# Patient Record
Sex: Male | Born: 1945 | Race: White | Hispanic: Refuse to answer | Marital: Married | Smoking: Never smoker
Health system: Southern US, Community
[De-identification: ages and names within clinical notes are randomized; demographics above are authoritative.]

## PROBLEM LIST (undated history)

## (undated) DIAGNOSIS — Z87442 Personal history of urinary calculi: Secondary | ICD-10-CM

## (undated) DIAGNOSIS — J309 Allergic rhinitis, unspecified: Secondary | ICD-10-CM

## (undated) DIAGNOSIS — I639 Cerebral infarction, unspecified: Secondary | ICD-10-CM

## (undated) DIAGNOSIS — N400 Enlarged prostate without lower urinary tract symptoms: Secondary | ICD-10-CM

## (undated) DIAGNOSIS — T7840XA Allergy, unspecified, initial encounter: Secondary | ICD-10-CM

## (undated) DIAGNOSIS — R350 Frequency of micturition: Secondary | ICD-10-CM

## (undated) DIAGNOSIS — G459 Transient cerebral ischemic attack, unspecified: Secondary | ICD-10-CM

## (undated) DIAGNOSIS — R7611 Nonspecific reaction to tuberculin skin test without active tuberculosis: Secondary | ICD-10-CM

## (undated) DIAGNOSIS — I10 Essential (primary) hypertension: Secondary | ICD-10-CM

## (undated) DIAGNOSIS — R0602 Shortness of breath: Secondary | ICD-10-CM

## (undated) DIAGNOSIS — N200 Calculus of kidney: Secondary | ICD-10-CM

## (undated) DIAGNOSIS — I2510 Atherosclerotic heart disease of native coronary artery without angina pectoris: Secondary | ICD-10-CM

## (undated) DIAGNOSIS — E785 Hyperlipidemia, unspecified: Secondary | ICD-10-CM

## (undated) HISTORY — DX: Personal history of urinary calculi: Z87.442

## (undated) HISTORY — DX: Frequency of micturition: R35.0

## (undated) HISTORY — DX: Essential (primary) hypertension: I10

## (undated) HISTORY — DX: Calculus of kidney: N20.0

## (undated) HISTORY — DX: Benign prostatic hyperplasia without lower urinary tract symptoms: N40.0

## (undated) HISTORY — DX: Allergic rhinitis, unspecified: J30.9

## (undated) HISTORY — PX: TONSILLECTOMY: SUR1361

## (undated) HISTORY — DX: Allergy, unspecified, initial encounter: T78.40XA

## (undated) HISTORY — DX: Atherosclerotic heart disease of native coronary artery without angina pectoris: I25.10

## (undated) HISTORY — DX: Shortness of breath: R06.02

## (undated) HISTORY — DX: Hyperlipidemia, unspecified: E78.5

## (undated) HISTORY — DX: Nonspecific reaction to tuberculin skin test without active tuberculosis: R76.11

---

## 2004-02-06 ENCOUNTER — Ambulatory Visit: Payer: Self-pay | Admitting: Internal Medicine

## 2004-02-11 ENCOUNTER — Ambulatory Visit: Payer: Self-pay | Admitting: Internal Medicine

## 2004-09-26 ENCOUNTER — Ambulatory Visit: Payer: Self-pay | Admitting: Internal Medicine

## 2005-03-20 ENCOUNTER — Ambulatory Visit: Payer: Self-pay | Admitting: Internal Medicine

## 2005-07-14 ENCOUNTER — Encounter: Payer: Self-pay | Admitting: Internal Medicine

## 2005-07-21 ENCOUNTER — Ambulatory Visit (HOSPITAL_COMMUNITY): Admission: RE | Admit: 2005-07-21 | Discharge: 2005-07-21 | Payer: Self-pay | Admitting: Urology

## 2005-09-08 ENCOUNTER — Ambulatory Visit: Payer: Self-pay | Admitting: Internal Medicine

## 2005-09-29 ENCOUNTER — Ambulatory Visit: Payer: Self-pay | Admitting: Internal Medicine

## 2006-01-12 HISTORY — PX: PROSTATE BIOPSY: SHX241

## 2006-03-11 ENCOUNTER — Encounter: Payer: Self-pay | Admitting: Internal Medicine

## 2006-04-14 ENCOUNTER — Ambulatory Visit: Payer: Self-pay | Admitting: Internal Medicine

## 2006-10-12 DIAGNOSIS — N401 Enlarged prostate with lower urinary tract symptoms: Secondary | ICD-10-CM | POA: Insufficient documentation

## 2006-10-12 DIAGNOSIS — N400 Enlarged prostate without lower urinary tract symptoms: Secondary | ICD-10-CM

## 2006-10-12 DIAGNOSIS — Z87442 Personal history of urinary calculi: Secondary | ICD-10-CM | POA: Insufficient documentation

## 2006-10-12 DIAGNOSIS — I10 Essential (primary) hypertension: Secondary | ICD-10-CM | POA: Insufficient documentation

## 2006-10-12 DIAGNOSIS — I2510 Atherosclerotic heart disease of native coronary artery without angina pectoris: Secondary | ICD-10-CM | POA: Insufficient documentation

## 2006-10-12 HISTORY — DX: Essential (primary) hypertension: I10

## 2006-10-12 HISTORY — DX: Personal history of urinary calculi: Z87.442

## 2006-10-12 HISTORY — DX: Benign prostatic hyperplasia without lower urinary tract symptoms: N40.0

## 2006-10-12 HISTORY — DX: Atherosclerotic heart disease of native coronary artery without angina pectoris: I25.10

## 2006-11-08 ENCOUNTER — Ambulatory Visit: Payer: Self-pay | Admitting: Internal Medicine

## 2006-11-08 DIAGNOSIS — E785 Hyperlipidemia, unspecified: Secondary | ICD-10-CM

## 2006-11-08 DIAGNOSIS — J309 Allergic rhinitis, unspecified: Secondary | ICD-10-CM

## 2006-11-08 DIAGNOSIS — M25529 Pain in unspecified elbow: Secondary | ICD-10-CM | POA: Insufficient documentation

## 2006-11-08 HISTORY — DX: Allergic rhinitis, unspecified: J30.9

## 2006-11-08 HISTORY — DX: Hyperlipidemia, unspecified: E78.5

## 2006-11-09 ENCOUNTER — Encounter: Payer: Self-pay | Admitting: Internal Medicine

## 2006-11-24 ENCOUNTER — Encounter: Payer: Self-pay | Admitting: Internal Medicine

## 2007-04-12 ENCOUNTER — Telehealth: Payer: Self-pay | Admitting: Internal Medicine

## 2007-04-29 ENCOUNTER — Ambulatory Visit: Payer: Self-pay | Admitting: Internal Medicine

## 2007-04-29 LAB — CONVERTED CEMR LAB
ALT: 29 units/L (ref 0–53)
Albumin: 4 g/dL (ref 3.5–5.2)
BUN: 7 mg/dL (ref 6–23)
Bilirubin, Direct: 0.1 mg/dL (ref 0.0–0.3)
Creatinine, Ser: 0.9 mg/dL (ref 0.4–1.5)
Eosinophils Relative: 4.4 % (ref 0.0–5.0)
GFR calc Af Amer: 110 mL/min
Glucose, Bld: 96 mg/dL (ref 70–99)
Glucose, Urine, Semiquant: NEGATIVE
HCT: 45.1 % (ref 39.0–52.0)
Ketones, urine, test strip: NEGATIVE
Monocytes Absolute: 0.5 10*3/uL (ref 0.1–1.0)
Monocytes Relative: 9.8 % (ref 3.0–12.0)
Neutro Abs: 2.6 10*3/uL (ref 1.4–7.7)
Nitrite: NEGATIVE
PSA: 9.11 ng/mL — ABNORMAL HIGH (ref 0.10–4.00)
Platelets: 186 10*3/uL (ref 150–400)
RBC: 5.15 M/uL (ref 4.22–5.81)
RDW: 12.5 % (ref 11.5–14.6)
TSH: 2.09 microintl units/mL (ref 0.35–5.50)
Total Bilirubin: 1 mg/dL (ref 0.3–1.2)
Total Protein: 6.7 g/dL (ref 6.0–8.3)
Urobilinogen, UA: 0.2
WBC Urine, dipstick: NEGATIVE

## 2007-05-05 ENCOUNTER — Ambulatory Visit: Payer: Self-pay | Admitting: Internal Medicine

## 2008-08-03 ENCOUNTER — Ambulatory Visit: Payer: Self-pay | Admitting: Family Medicine

## 2008-08-03 LAB — CONVERTED CEMR LAB
AST: 22 units/L (ref 0–37)
BUN: 15 mg/dL (ref 6–23)
Basophils Absolute: 0 10*3/uL (ref 0.0–0.1)
Basophils Relative: 0.4 % (ref 0.0–3.0)
CO2: 31 meq/L (ref 19–32)
Creatinine, Ser: 0.9 mg/dL (ref 0.4–1.5)
Eosinophils Relative: 4.2 % (ref 0.0–5.0)
GFR calc non Af Amer: 90.48 mL/min (ref >60)
Glucose, Bld: 97 mg/dL (ref 70–99)
Hemoglobin: 15.7 g/dL (ref 13.0–17.0)
Lymphocytes Relative: 28.8 % (ref 12.0–46.0)
Lymphs Abs: 1.3 10*3/uL (ref 0.7–4.0)
MCV: 89 fL (ref 78.0–100.0)
Monocytes Relative: 11.6 % (ref 3.0–12.0)
Neutro Abs: 2.6 10*3/uL (ref 1.4–7.7)
Neutrophils Relative %: 55 % (ref 43.0–77.0)
Nitrite: NEGATIVE
PSA: 9.25 ng/mL — ABNORMAL HIGH (ref 0.10–4.00)
RBC: 5.07 M/uL (ref 4.22–5.81)
Sodium: 144 meq/L (ref 135–145)
Specific Gravity, Urine: 1.025
Urobilinogen, UA: 1
VLDL: 27 mg/dL (ref 0.0–40.0)
WBC Urine, dipstick: NEGATIVE

## 2008-08-07 ENCOUNTER — Ambulatory Visit: Payer: Self-pay | Admitting: Internal Medicine

## 2008-08-15 ENCOUNTER — Ambulatory Visit: Payer: Self-pay | Admitting: Gastroenterology

## 2008-08-21 ENCOUNTER — Encounter: Payer: Self-pay | Admitting: Internal Medicine

## 2008-11-07 ENCOUNTER — Ambulatory Visit: Payer: Self-pay | Admitting: Internal Medicine

## 2008-11-07 DIAGNOSIS — S93409A Sprain of unspecified ligament of unspecified ankle, initial encounter: Secondary | ICD-10-CM | POA: Insufficient documentation

## 2008-11-07 LAB — CONVERTED CEMR LAB
AST: 23 units/L (ref 0–37)
LDL Cholesterol: 115 mg/dL — ABNORMAL HIGH (ref 0–99)
Total CHOL/HDL Ratio: 5
VLDL: 31.4 mg/dL (ref 0.0–40.0)

## 2009-02-06 ENCOUNTER — Ambulatory Visit: Payer: Self-pay | Admitting: Internal Medicine

## 2009-03-22 ENCOUNTER — Telehealth: Payer: Self-pay | Admitting: Internal Medicine

## 2009-03-22 ENCOUNTER — Encounter: Payer: Self-pay | Admitting: Internal Medicine

## 2009-05-07 ENCOUNTER — Encounter: Payer: Self-pay | Admitting: Internal Medicine

## 2009-06-05 ENCOUNTER — Ambulatory Visit: Payer: Self-pay | Admitting: Internal Medicine

## 2009-06-05 DIAGNOSIS — T887XXA Unspecified adverse effect of drug or medicament, initial encounter: Secondary | ICD-10-CM

## 2009-06-05 LAB — CONVERTED CEMR LAB: AST: 23 units/L (ref 0–37)

## 2009-10-18 ENCOUNTER — Ambulatory Visit: Payer: Self-pay | Admitting: Family Medicine

## 2009-10-18 DIAGNOSIS — R350 Frequency of micturition: Secondary | ICD-10-CM

## 2009-10-18 HISTORY — DX: Frequency of micturition: R35.0

## 2009-10-18 LAB — CONVERTED CEMR LAB
Bilirubin Urine: NEGATIVE
Glucose, Urine, Semiquant: NEGATIVE
Ketones, urine, test strip: NEGATIVE
Protein, U semiquant: NEGATIVE
Urobilinogen, UA: 0.2

## 2009-10-19 ENCOUNTER — Encounter: Payer: Self-pay | Admitting: Internal Medicine

## 2009-11-21 ENCOUNTER — Encounter: Payer: Self-pay | Admitting: Internal Medicine

## 2009-11-26 ENCOUNTER — Ambulatory Visit: Payer: Self-pay | Admitting: Internal Medicine

## 2009-11-26 LAB — CONVERTED CEMR LAB
Albumin: 4.1 g/dL (ref 3.5–5.2)
BUN: 14 mg/dL (ref 6–23)
Bilirubin Urine: NEGATIVE
Cholesterol: 144 mg/dL (ref 0–200)
Creatinine, Ser: 0.8 mg/dL (ref 0.4–1.5)
Eosinophils Relative: 8.9 % — ABNORMAL HIGH (ref 0.0–5.0)
Glucose, Urine, Semiquant: NEGATIVE
HDL: 34.9 mg/dL — ABNORMAL LOW (ref >39.00)
Hemoglobin: 15.1 g/dL (ref 13.0–17.0)
Ketones, urine, test strip: NEGATIVE
LDL Cholesterol: 85 mg/dL (ref 0–99)
Lymphocytes Relative: 24.5 % (ref 12.0–46.0)
MCHC: 34.6 g/dL (ref 30.0–36.0)
MCV: 88.7 fL (ref 78.0–100.0)
PSA: 8.98 ng/mL — ABNORMAL HIGH (ref 0.10–4.00)
RDW: 13.1 % (ref 11.5–14.6)
Sodium: 139 meq/L (ref 135–145)
Specific Gravity, Urine: 1.02
TSH: 2.54 microintl units/mL (ref 0.35–5.50)
Total Bilirubin: 1 mg/dL (ref 0.3–1.2)
Total Protein: 6.4 g/dL (ref 6.0–8.3)
Triglycerides: 122 mg/dL (ref 0.0–149.0)
Urobilinogen, UA: 0.2
VLDL: 24.4 mg/dL (ref 0.0–40.0)
pH: 7

## 2009-12-02 ENCOUNTER — Ambulatory Visit: Payer: Self-pay | Admitting: Internal Medicine

## 2009-12-02 ENCOUNTER — Encounter: Payer: Self-pay | Admitting: Internal Medicine

## 2009-12-19 ENCOUNTER — Ambulatory Visit: Payer: Self-pay | Admitting: Internal Medicine

## 2009-12-19 DIAGNOSIS — J069 Acute upper respiratory infection, unspecified: Secondary | ICD-10-CM | POA: Insufficient documentation

## 2010-01-12 HISTORY — PX: CARDIAC CATHETERIZATION: SHX172

## 2010-02-11 NOTE — Assessment & Plan Note (Signed)
Summary: cpx//ccm   Vital Signs:  Patient profile:   65 year old male Height:      68 inches Weight:      213 pounds Temp:     97.5 degrees F oral BP sitting:   150 / 90  (right arm) Cuff size:   regular  Vitals Entered By: Duard Brady LPN (December 02, 2009 2:45 PM) CC: cpx - doing well Is Patient Diabetic? No   CC:  cpx - doing well.  History of Present Illness: a 65 year old patient who is seen today for a health maintenance examination.  He has a history of treated hypertension and minimal coronary artery disease.  He is status post heart catheterization in 2000.  He has dyslipidemia, and a chronically elevated PSA.  He is followed by urology twice annually.  Allergies (verified): No Known Drug Allergies  Past History:  Past Medical History: Reviewed history from 02/06/2009 and no changes required. Allergic rhinitis Hyperlipidemia Hypertension Nephrolithiasis, hx of Benign prostatic hypertrophy chronic microhematuria chronically elevated PSA Positive PPD Coronary artery disease-mild nonobstructive  Past Surgical History: Reviewed history from 02/06/2009 and no changes required. kidney stones 1980 heart catheterization 2000 with minimal nonobstructive  CAD prostate biopsy  x 3 colonoscopy approximately 1995  Family History: Reviewed history from 11/08/2006 and no changes required. 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    Physical Exam  General:  overweight-appearing.  140/90overweight-appearing.   Head:  Normocephalic and atraumatic without obvious abnormalities. No apparent alopecia or balding. Eyes:  No corneal or conjunctival inflammation noted. EOMI. Perrla. Funduscopic exam benign, without hemorrhages, exudates or papilledema. Vision grossly normal. Ears:  External ear exam shows no significant lesions or deformities.  Otoscopic examination reveals clear canals, tympanic membranes are intact bilaterally without bulging, retraction, inflammation or discharge. Hearing is grossly normal bilaterally. Nose:  External nasal examination shows no deformity or inflammation. Nasal mucosa are pink and moist without lesions or exudates. Mouth:  Oral mucosa and oropharynx without lesions or exudates.  Teeth in good repair. Neck:  No deformities, masses, or tenderness noted. Chest Wall:  No deformities, masses, tenderness or gynecomastia noted. Breasts:  No masses or gynecomastia noted Lungs:  Normal respiratory effort, chest expands symmetrically. Lungs are clear to auscultation, no crackles or wheezes. Heart:  Normal rate and regular rhythm. S1 and S2 normal without gallop, murmur, click, rub or other extra sounds. Abdomen:  Bowel sounds positive,abdomen soft and non-tender without masses, organomegaly or hernias noted. Msk:  No deformity or scoliosis noted of thoracic or lumbar spine.   Pulses:  R and L carotid,radial,femoral,dorsalis pedis and posterior tibial pulses are full and equal bilaterally Neurologic:  No cranial nerve deficits noted. Station and gait are normal. Plantar reflexes are down-going bilaterally. DTRs are symmetrical throughout. Sensory, motor and coordinative functions appear  intact. Skin:  Intact without suspicious lesions or rashes Cervical Nodes:  No lymphadenopathy noted Axillary Nodes:  No palpable  lymphadenopathy Inguinal Nodes:  No significant adenopathy Psych:  Cognition and judgment appear intact. Alert and cooperative with normal attention span and concentration. No apparent delusions, illusions, hallucinations   Impression & Recommendations:  Problem # 1:  HEALTH MAINTENANCE EXAM (ICD-V70.0)  Complete Medication List: 1)  Baby Aspirin 81 Mg Chew (Aspirin) .Marland Kitchen.. 1 once daily 2)  Simvastatin 40 Mg Tabs (Simvastatin) .... One daily 3)  Align 4 Mg Caps (Probiotic product) .Marland Kitchen.. 1 cap by mouth daily as needed antibiotic use 4)  Aleve 220 Mg Tabs (Naproxen sodium) .Marland Kitchen.. 1-2 tabs by mouth two times a day as needed pain with food 5)  Atenolol-chlorthalidone 50-25 Mg Tabs (Atenolol-chlorthalidone) .... One daily  Other Orders: EKG w/ Interpretation (93000)  Patient Instructions: 1)  Please schedule a follow-up appointment in 6 months. 2)  Limit your Sodium (Salt). 3)  It is important that you exercise regularly at least 20 minutes 5 times a week. If you develop chest pain, have severe difficulty breathing, or feel very tired , stop exercising immediately and seek medical attention. 4)  You need to lose weight. Consider a lower calorie diet and regular exercise.  5)  Check your Blood Pressure regularly. If it is above: 150/90  you should make an appointment. Prescriptions: ATENOLOL-CHLORTHALIDONE 50-25 MG TABS (ATENOLOL-CHLORTHALIDONE) one daily  #90 x 6   Entered and Authorized by:   Gordy Savers  MD   Signed by:   Gordy Savers  MD on 12/02/2009   Method used:   Electronically to        [facility]  [phone]* (retail)       [address] [place], [place]  [address]       Ph: [phone] or [phone]       Fax: [phone]   RxID:   [id number] SIMVASTATIN 40 MG TABS (SIMVASTATIN) one daily  #90 x 6   Entered and  Authorized by:   Gordy Savers  MD   Signed by:   Gordy Savers  MD on 12/02/2009   Method used:   Electronically to        [facility]  [phone]* (retail)       [address] [place], [place]  [address]       Ph: [phone] or [phone]       Fax: [phone]   RxID:   [id number]    Orders Added: 1)  EKG w/ Interpretation [93000] 2)  Est. Patient 40-64 years [29937]

## 2010-02-11 NOTE — Assessment & Plan Note (Signed)
Summary: ROA X 3 MTHS / RS   Vital Signs:  Patient profile:   65 year old male Weight:      210 pounds Temp:     97.6 degrees F oral BP sitting:   130 / 84  (left arm) Cuff size:   regular  Vitals Entered By: Raechel Ache, RN (February 06, 2009 9:58 AM) CC: 3 mo ROV Is Patient Diabetic? No  Does patient need assistance? Functional Status Social activities   CC:  3 mo ROV.  History of Present Illness: 65 year old patient who is seen today for follow-up of  hypertension, dyslipidemia, and coronary artery  disease.  He remains asymptomatic.  He is now on simvastatin 20 mg daily.  His last LDL cholesterol 115.  He has tolerated this medication well and denies any myalgias or muscle weakness.  Denies any exertional chest pain or shortness of breath.  Allergies: No Known Drug Allergies  Past History:  Past Medical History: Allergic rhinitis Hyperlipidemia Hypertension Nephrolithiasis, hx of Benign prostatic hypertrophy chronic microhematuria chronically elevated PSA Positive PPD Coronary artery disease-mild nonobstructive  Past Surgical History: kidney stones 1980 heart catheterization 2000 with minimal nonobstructive  CAD prostate biopsy  x 3 colonoscopy approximately 1995  Family History: Reviewed history from 11/08/2006 and no changes required. 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    Physical Exam  General:  Well-developed,well-nourished,in no acute distress; alert,appropriate and cooperative throughout examination; 120/72 Head:  Normocephalic and atraumatic without obvious abnormalities. No apparent alopecia or balding. Eyes:  No corneal or conjunctival inflammation noted. EOMI. Perrla. Funduscopic exam benign, without hemorrhages, exudates or papilledema. Vision grossly normal. Mouth:  Oral mucosa and oropharynx without lesions or exudates.  Teeth in good repair. Neck:  No deformities, masses, or tenderness noted. Lungs:  Normal respiratory effort, chest expands symmetrically. Lungs are clear to auscultation, no crackles or wheezes. Heart:  Normal rate and regular rhythm. S1 and S2 normal without gallop, murmur, click, rub or other extra sounds. Msk:  No deformity or scoliosis noted of thoracic or lumbar spine.   Pulses:  R and L carotid,radial,femoral,dorsalis pedis and posterior tibial pulses are full and equal bilaterally Extremities:  No clubbing, cyanosis, edema, or deformity noted with normal full range of motion of all joints.     Impression & Recommendations:  Problem # 1:  HYPERLIPIDEMIA (ICD-272.4)  The following medications were removed from the medication list:    Simvastatin 20 Mg Tabs (Simvastatin) ..... One daily His updated medication list for this problem includes:    Simvastatin 40 Mg Tabs (Simvastatin) ..... One daily    The following medications were removed from the medication list:    Simvastatin 20 Mg Tabs (Simvastatin) ..... One daily His updated medication list for this problem includes:    Simvastatin 40 Mg Tabs (Simvastatin) ..... One daily  Orders: Prescription Created Electronically [phone])  Problem # 2:  HYPERTENSION (ICD-401.9)  His updated medication  list for  this problem includes:    Atenolol 25 Mg Tabs (Atenolol) .Marland Kitchen... 1 once daily    Hydrochlorothiazide 12.5 Mg Caps (Hydrochlorothiazide) .Marland Kitchen... 1 once daily  His updated medication list for this problem includes:    Atenolol 25 Mg Tabs (Atenolol) .Marland Kitchen... 1 once daily    Hydrochlorothiazide 12.5 Mg Caps (Hydrochlorothiazide) .Marland Kitchen... 1 once daily  Complete Medication List: 1)  Atenolol 25 Mg Tabs (Atenolol) .Marland Kitchen.. 1 once daily 2)  Hydrochlorothiazide 12.5 Mg Caps (Hydrochlorothiazide) .Marland Kitchen.. 1 once daily 3)  Baby Aspirin 81 Mg Chew (Aspirin) .Marland Kitchen.. 1 once daily 4)  Simvastatin 40 Mg Tabs (Simvastatin) .... One daily  Patient Instructions: 1)  Please schedule a follow-up appointment in 4 months. 2)  Limit your Sodium (Salt). 3)  It is important that you exercise regularly at least 20 minutes 5 times a week. If you develop chest pain, have severe difficulty breathing, or feel very tired , stop exercising immediately and seek medical attention. Prescriptions: SIMVASTATIN 40 MG TABS (SIMVASTATIN) one daily  #90 x 6   Entered and Authorized by:   Gordy Savers  MD   Signed by:   Gordy Savers  MD on 02/06/2009   Method used:   [facility] to        [facility]  [phone]* (retail)       [address] [place], [place]  [address]       Ph: [phone] or [phone]       Fax: [phone]   RxID:   [id number]

## 2010-02-11 NOTE — Progress Notes (Signed)
Summary: letter needed  Phone Note Call from Patient   Caller: Patient Call For: Gordy Savers  MD Details for Reason: letter Summary of Call: Dr. Kirtland Bouchard does an annual letter for Thornell Sartorius (DOB August 21, 2045) - The letter is a general health statement along with any meds taken.  Please call Mr. Copelin at [phone] when the letter is complete. - information was given to Arion at front desk . I reviewed chart - last  letter done 05/05/07. KIK Initial call taken by: Duard Brady LPN,  March 22, 2009 11:35 AM  Follow-up for Phone Call        done Follow-up by: Gordy Savers  MD,  March 22, 2009 12:57 PM  Additional Follow-up for Phone Call Additional follow up Details #1::        called hm# - ans mach - LMTCB if questions - Letter ready for pick up.  Additional Follow-up by: Duard Brady LPN,  March 22, 2009 1:20 PM

## 2010-02-11 NOTE — Letter (Signed)
Summary: [facility]  [facility]   Imported By: Maryln Gottron 11/29/2009 10:21:00  _____________________________________________________________________  External Attachment:    Type:   Image     Comment:   External Document

## 2010-02-11 NOTE — Letter (Signed)
Summary: [facility]  [facility]   Imported By: Maryln Gottron 12/11/2009 14:21:05  _____________________________________________________________________  External Attachment:    Type:   Image     Comment:   External Document

## 2010-02-11 NOTE — Letter (Signed)
Summary: Generic Letter  [place] [facility]  [address] [place], [place] [address]   Phone: [phone]  Fax: [phone]    03/22/2009  SAHIB PELLA [address] [place], [place]  [address]  Dear Milford Cage:  Mr. Hallenbeck is a patient followed in our internal medicine practice since 2004.  His last annual examination was in July of 2010 and his most recent office visit in January of this year. This patient has mild dyslipidemia, and treated hypertension, which have been under excellent control.  He has no exercise limitations and remains stable and quite compliant with his medical regimen. If further details are required please do not hesitate to contact this office.          Sincerely,   Eleonore Chiquito  MD

## 2010-02-11 NOTE — Assessment & Plan Note (Signed)
Summary: UTI/ dm   Vital Signs:  Patient profile:   65 year old male Height:      68 inches (172.72 cm) Weight:      211 pounds (95.91 kg) BMI:     32.20 O2 Sat:      97 % on Room air Temp:     98.1 degrees F (36.72 degrees C) oral Pulse rate:   59 / minute BP sitting:   158 / 90  (left arm) Cuff size:   regular  Vitals Entered By: Josph Macho RMA (October 18, 2009 3:44 PM)  O2 Flow:  Room air  History of Present Illness: Patient in today for evaluation of 1 week worth of symptoms. He started to have low back pain on Sunday am after working on a door. Unfortunately the low back pain persisted and now has worsened despite heat and resting his back. Then about 3 days ago he began to have urinary frequency, urgency and dysuria, as well as low grade fevers/chills/nausea. Denies any CP/palp/SOB/GI c/o. Patient sees Urology every 6 months and has had trouble with UTIs in the past  Allergies: No Known Drug Allergies  Past History:  Past medical history reviewed for relevance to current acute and chronic problems. Social history (including risk factors) reviewed for relevance to current acute and chronic problems.  Past Medical History: Reviewed history from 02/06/2009 and no changes required. 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 no    Any prior history of allergic reactions to egg and/or gelatin? no    Do you have a sensitivity to the preservative Thimersol? no    Do you have a past history of Guillan-Barre Syndrome? no    Do you currently have an acute  febrile illness? no    Have you ever had a severe reaction to latex? no    Vaccine information given and explained to patient? yes    Are you currently pregnant? no    Lot Number:[id number]   Exp Date:07/12/2010   Site Given  Left Deltoid IM Josph Macho RMA  October 18, 2009 3:46 PM    Physical Exam  General:  Well-developed,well-nourished,in no acute distress; alert,appropriate and cooperative throughout examination Head:  Normocephalic and atraumatic without obvious abnormalities. No apparent alopecia or balding. Mouth:  Oral mucosa and oropharynx without lesions or exudates.  Teeth in good repair. Neck:  No deformities, masses, or tenderness noted. Lungs:  Normal respiratory effort, chest expands symmetrically. Lungs are clear to auscultation, no crackles or wheezes. Heart:  Normal rate and regular rhythm. S1 and S2 normal without gallop, murmur, click, rub or other extra sounds. Abdomen:  Bowel sounds positive,abdomen soft and non-tender without masses, organomegaly or hernias noted. Extremities:  No clubbing, cyanosis, edema, or deformity noted with normal full range of motion of all joints.   Psych:  Cognition and judgment appear intact. Alert and cooperative with normal attention span and concentration. No apparent delusions, illusions, hallucinations   Impression & Recommendations:  Problem # 1:  URINARY FREQUENCY (ICD-788.41) Ciprofloxacin, increase fluids, may use Aleve as needed and start Align daily for the  next month  Problem # 2:  HYPERTENSION (ICD-401.9)  His updated medication list for this problem includes:    Atenolol 25 Mg Tabs (Atenolol) .Marland Kitchen... 1 once daily    Hydrochlorothiazide 12.5 Mg Caps (Hydrochlorothiazide) .Marland Kitchen... 1 once daily Mild elevation with acute illness, does report it tends to be up when he is here, willhave him monitor and report persistently elevated numbers  Complete Medication List: 1)  Atenolol 25 Mg Tabs (Atenolol) .Marland Kitchen.. 1 once daily 2)   Hydrochlorothiazide 12.5 Mg Caps (Hydrochlorothiazide) .Marland Kitchen.. 1 once daily 3)  Baby Aspirin 81 Mg Chew (Aspirin) .Marland Kitchen.. 1 once daily 4)  Simvastatin 40 Mg Tabs (Simvastatin) .... One daily 5)  Cipro 500 Mg Tabs (Ciprofloxacin hcl) .Marland Kitchen.. 1 tab by mouth two times a day x 7days 6)  Align 4 Mg Caps (Probiotic product) .Marland Kitchen.. 1 cap by mouth daily as needed antibiotic use 7)  Aleve 220 Mg Tabs (Naproxen sodium) .Marland Kitchen.. 1-2 tabs by mouth two times a day as needed pain with food  Other Orders: UA Dipstick w/o Micro (automated)  (81003) Admin 1st Vaccine (16109) Flu Vaccine 52yrs + (60454) T-Urine Culture (Spectrum Order) [phone])  Patient Instructions: 1)  Drink plenty of fluids up to 3-4 quarts a day. Cranberry juice is especially recommended in addition to large amounts of water. Avoid caffeine & carbonated drinks, they tend to irritate the bladder, Return in 3-5 days if you're not better: sooner if you're feeling worse.  2)  Take 650 - 1000 mg of tylenol every 4-6 hours as needed for relief of pain or comfort of fever. Avoid taking more than 3000 mg in a 24 hour period( can cause liver damage in higher doses).  3)  Please schedule a follow-up appointment as needed if symptoms worsen Prescriptions: CIPRO 500 MG TABS (CIPROFLOXACIN HCL) 1 tab by mouth two times a day x 7days  #14 x 0   Entered and Authorized by:   Danise Edge MD   Signed by:   Danise Edge MD on 10/18/2009   Method used:   [facility]  [address]  [phone]* (retail)       [address] [place], [place]  [address]       Ph: [phone] or [phone]       Fax: [phone]   RxID:   [id number]   Laboratory Results   Urine Tests  Date/Time Recieved: October 18, 2009 3:34 PM  Date/Time Reported: October 18, 2009 3:34 PM   Routine Urinalysis   Color: yellow Appearance: Clear Glucose: negative   (Normal Range: Negative) Bilirubin: negative   (Normal Range: Negative) Ketone: negative   (Normal  Range: Negative) Spec. Gravity: 1.015   (Normal Range: 1.003-1.035) Blood: 3+   (Normal Range: Negative) pH: 6.5   (Normal Range: 5.0-8.0) Protein: negative   (Normal Range: Negative) Urobilinogen: 0.2   (Normal Range: 0-1) Nitrite: negative   (Normal Range: Negative) Leukocyte Esterace: negative   (Normal Range: Negative)    Comments: Wynona Canes, CMA  October 18, 2009 3:34 PM

## 2010-02-11 NOTE — Assessment & Plan Note (Signed)
Summary: 4 month rov/pt will come in fasting/njr   Vital Signs:  Patient profile:   65 year old male Weight:      209 pounds Temp:     98.0 degrees F oral BP sitting:   130 / 80  (left arm) Cuff size:   regular  Vitals Entered By: Duard Brady LPN (Jun 05, 2009 8:01 AM) CC: 4 mos rov - doing well Is Patient Diabetic? No   CC:  4 mos rov - doing well.  History of Present Illness: 65 year old patient seen today for follow-up.  He has hypertension, dyslipidemia, and minimal coronary artery disease.  He has been on statin therapy for less than one year and is seen today for follow-up.  Lab.  He is asymptomatic.  Denies any exertional chest pain.  He has tolerated medication without difficulty and denies any muscle pain or weakness.  Preventive Screening-Counseling & Management  Alcohol-Tobacco     Smoking Status: never  Allergies (verified): No Known Drug Allergies  Past History:  Past Medical History: Reviewed history from 02/06/2009 and no changes required. Allergic rhinitis Hyperlipidemia Hypertension Nephrolithiasis, hx of Benign prostatic hypertrophy chronic microhematuria chronically elevated PSA Positive PPD Coronary artery disease-mild nonobstructive  Past Surgical History: Reviewed history from 02/06/2009 and no changes required. kidney stones 1980 heart catheterization 2000 with minimal nonobstructive  CAD prostate biopsy  x 3 colonoscopy approximately 1995  Review of Systems  The patient denies anorexia, fever, weight loss, weight gain, vision loss, decreased hearing, hoarseness, chest pain, syncope, dyspnea on exertion, peripheral edema, prolonged cough, headaches, hemoptysis, abdominal pain, melena, hematochezia, severe indigestion/heartburn, hematuria, incontinence, genital sores, muscle weakness, suspicious skin lesions, transient blindness, difficulty walking, depression, unusual weight change, abnormal bleeding, enlarged lymph nodes, angioedema,  breast masses, and testicular masses.    Physical Exam  General:  Well-developed,well-nourished,in no acute distress; alert,appropriate and cooperative throughout examination; 130/90 Head:  Normocephalic and atraumatic without obvious abnormalities. No apparent alopecia or balding. Eyes:  No corneal or conjunctival inflammation noted. EOMI. Perrla. Funduscopic exam benign, without hemorrhages, exudates or papilledema. Vision grossly normal. Ears:  External ear exam shows no significant lesions or deformities.  Otoscopic examination reveals clear canals, tympanic membranes are intact bilaterally without bulging, retraction, inflammation or discharge. Hearing is grossly normal bilaterally. Mouth:  Oral mucosa and oropharynx without lesions or exudates.  Teeth in good repair. Neck:  No deformities, masses, or tenderness noted. Lungs:  Normal respiratory effort, chest expands symmetrically. Lungs are clear to auscultation, no crackles or wheezes. Heart:  Normal rate and regular rhythm. S1 and S2 normal without gallop, murmur, click, rub or other extra sounds. Abdomen:  Bowel sounds positive,abdomen soft and non-tender without masses, organomegaly or hernias noted. Msk:  No deformity or scoliosis noted of thoracic or lumbar spine.   Pulses:  R and L carotid,radial,femoral,dorsalis pedis and posterior tibial pulses are full and equal bilaterally Extremities:  No clubbing, cyanosis, edema, or deformity noted with normal full range of motion of all joints.     Impression & Recommendations:  Problem # 1:  HYPERLIPIDEMIA (ICD-272.4)  His updated medication list for this problem includes:    Simvastatin 40 Mg Tabs (Simvastatin) ..... One daily    His updated medication list for this problem includes:    Simvastatin 40 Mg Tabs (Simvastatin) ..... One daily  Problem # 2:  HYPERTENSION (ICD-401.9)  His updated medication list for this problem includes:    Atenolol 25 Mg Tabs (Atenolol) .Marland Kitchen... 1  once daily  Hydrochlorothiazide 12.5 Mg Caps (Hydrochlorothiazide) .Marland Kitchen... 1 once daily    His updated medication list for this problem includes:    Atenolol 25 Mg Tabs (Atenolol) .Marland Kitchen... 1 once daily    Hydrochlorothiazide 12.5 Mg Caps (Hydrochlorothiazide) .Marland Kitchen... 1 once daily  Complete Medication List: 1)  Atenolol 25 Mg Tabs (Atenolol) .Marland Kitchen.. 1 once daily 2)  Hydrochlorothiazide 12.5 Mg Caps (Hydrochlorothiazide) .Marland Kitchen.. 1 once daily 3)  Baby Aspirin 81 Mg Chew (Aspirin) .Marland Kitchen.. 1 once daily 4)  Simvastatin 40 Mg Tabs (Simvastatin) .... One daily  Other Orders: Prescription Created Electronically [phone]) Venipuncture [phone]) TLB-AST (SGOT) ([id number]-[id number]) TLB-Lipid Panel (80061-LIPID)  Patient Instructions: 1)  Please schedule a follow-up appointment in 6 months. 2)  Limit your Sodium (Salt) to less than 2 grams a day(slightly less than 1/2 a teaspoon) to prevent fluid retention, swelling, or worsening of symptoms. 3)  It is important that you exercise regularly at least 20 minutes 5 times a week. If you develop chest pain, have severe difficulty breathing, or feel very tired , stop exercising immediately and seek medical attention. 4)  You need to lose weight. Consider a lower calorie diet and regular exercise.  Prescriptions: SIMVASTATIN 40 MG TABS (SIMVASTATIN) one daily  #90 x 6   Entered and Authorized by:   Gordy Savers  MD   Signed by:   Gordy Savers  MD on 06/05/2009   Method used:   Electronically to        [facility]  [phone]* (retail)       [address] [place], [place]  [address]       Ph: [phone] or [phone]       Fax: [phone]   RxID:   [id number] HYDROCHLOROTHIAZIDE 12.5 MG  CAPS (HYDROCHLOROTHIAZIDE) 1 once daily  #90 Capsule x 6   Entered and Authorized by:   Gordy Savers  MD   Signed by:   Gordy Savers  MD on 06/05/2009   Method used:   Electronically to        [facility]  [phone]* (retail)       [address] [place], [place]  [address]       Ph: [phone] or [phone]       Fax: [phone]   RxID:   [id number] ATENOLOL 25 MG  TABS (ATENOLOL) 1 once daily  #90 Tablet x 6   Entered and Authorized by:   Gordy Savers  MD   Signed by:   Gordy Savers  MD on 06/05/2009   Method used:   Electronically to        [facility]  [phone]* (retail)       [address] [place], [place]  [address]       Ph: [phone] or [phone]       Fax: [phone]   RxID:   [id number]

## 2010-02-11 NOTE — Letter (Signed)
Summary: [facility]   Imported By: Maryln Gottron 05/15/2009 12:50:33  _____________________________________________________________________  External Attachment:    Type:   Image     Comment:   External Document

## 2010-02-11 NOTE — Assessment & Plan Note (Signed)
Summary: cough/chest congestion/cjr   Vital Signs:  Patient profile:   65 year old male Weight:      210 pounds Temp:     98.3 degrees F oral BP sitting:   120 / 78  (left arm) Cuff size:   regular  Vitals Entered By: Duard Brady LPN (December 19, 2009 10:02 AM) CC: c/o chest congestion , productive cough Is Patient Diabetic? No   CC:  c/o chest congestion  and productive cough.  History of Present Illness: 65 year old patient with a one-week history of chest congestion, and mildly productive cough.  Cough is productive of clear sputum.  Denies any chest pain, shortness of breath or wheezing.  Two days ago.  He had fever of 100 degrees that has not re-occurred.  He is treated dyslipidemia, and hypertension, which have been stable.  Cough is aggravating and interferes with sleep at night  Preventive Screening-Counseling & Management  Alcohol-Tobacco     Smoking Status: never  Allergies (verified): No Known Drug Allergies  Past History:  Past Medical History: Reviewed history from 02/06/2009 and no changes required. Allergic rhinitis Hyperlipidemia Hypertension Nephrolithiasis, hx of Benign prostatic hypertrophy chronic microhematuria chronically elevated PSA Positive PPD Coronary artery disease-mild nonobstructive  Past Surgical History: Reviewed history from 02/06/2009 and no changes required. kidney stones 1980 heart catheterization 2000 with minimal nonobstructive  CAD prostate biopsy  x 3 colonoscopy approximately 1995  Review of Systems       The patient complains of prolonged cough.  The patient denies anorexia, fever, weight loss, weight gain, vision loss, decreased hearing, hoarseness, chest pain, syncope, dyspnea on exertion, peripheral edema, headaches, hemoptysis, abdominal pain, melena, hematochezia, severe indigestion/heartburn, hematuria, incontinence, genital sores, muscle weakness, suspicious skin lesions, transient blindness, difficulty  walking, depression, unusual weight change, abnormal bleeding, enlarged lymph nodes, angioedema, breast masses, and testicular masses.    Physical Exam  General:  Well-developed,well-nourished,in no acute distress; alert,appropriate and cooperative throughout examination Head:  Normocephalic and atraumatic without obvious abnormalities. No apparent alopecia or balding. Eyes:  No corneal or conjunctival inflammation noted. EOMI. Perrla. Funduscopic exam benign, without hemorrhages, exudates or papilledema. Vision grossly normal. Nose:  External nasal examination shows no deformity or inflammation. Nasal mucosa are pink and moist without lesions or exudates. Mouth:  Oral mucosa and oropharynx without lesions or exudates.  Teeth in good repair. Neck:  No deformities, masses, or tenderness noted. Lungs:  Normal respiratory effort, chest expands symmetrically. Lungs are clear to auscultation, no crackles or wheezes.  O2 saturation 98 Heart:  Normal rate and regular rhythm. S1 and S2 normal without gallop, murmur, click, rub or other extra sounds. Abdomen:  Bowel sounds positive,abdomen soft and non-tender without masses, organomegaly or hernias noted. Msk:  No deformity or scoliosis noted of thoracic or lumbar spine.   Pulses:  R and L carotid,radial,femoral,dorsalis pedis and posterior tibial pulses are full and equal bilaterally Extremities:  No clubbing, cyanosis, edema, or deformity noted with normal full range of motion of all joints.     Impression & Recommendations:  Problem # 1:  URI (ICD-465.9)  His updated medication list for this problem includes:    Baby Aspirin 81 Mg Chew (Aspirin) .Marland Kitchen... 1 once daily    Aleve 220 Mg Tabs (Naproxen sodium) .Marland Kitchen... 1-2 tabs by mouth two times a day as needed pain with food    Hydrocodone-homatropine 5-1.5 Mg/35ml Syrp (Hydrocodone-homatropine) .Marland Kitchen... 1 teaspoon every 6 hours as needed for cough  Problem # 2:  HYPERTENSION (ICD-401.9)  His updated  medication list for this problem includes:    Atenolol-chlorthalidone 50-25 Mg Tabs (Atenolol-chlorthalidone) ..... One daily  Complete Medication List: 1)  Baby Aspirin 81 Mg Chew (Aspirin) .Marland Kitchen.. 1 once daily 2)  Simvastatin 40 Mg Tabs (Simvastatin) .... One daily 3)  Align 4 Mg Caps (Probiotic product) .Marland Kitchen.. 1 cap by mouth daily as needed antibiotic use 4)  Aleve 220 Mg Tabs (Naproxen sodium) .Marland Kitchen.. 1-2 tabs by mouth two times a day as needed pain with food 5)  Atenolol-chlorthalidone 50-25 Mg Tabs (Atenolol-chlorthalidone) .... One daily 6)  Hydrocodone-homatropine 5-1.5 Mg/62ml Syrp (Hydrocodone-homatropine) .Marland Kitchen.. 1 teaspoon every 6 hours as needed for cough  Patient Instructions: 1)  Get plenty of rest, drink lots of clear liquids, and use Tylenol or Ibuprofen for fever and comfort. Return in 7-10 days if you're not better:sooner if you're feeling worse. 2)  Please schedule a follow-up appointment as needed. Prescriptions: HYDROCODONE-HOMATROPINE 5-1.5 MG/5ML SYRP (HYDROCODONE-HOMATROPINE) 1 teaspoon every 6 hours as needed for cough  #6 oz x 1   Entered and Authorized by:   Gordy Savers  MD   Signed by:   Gordy Savers  MD on 12/19/2009   Method used:   Print then Give to Patient   RxID:   [id number]    Orders Added: 1)  Est. Patient Level III [[address]]

## 2010-03-03 ENCOUNTER — Telehealth: Payer: Self-pay

## 2010-03-03 DIAGNOSIS — I10 Essential (primary) hypertension: Secondary | ICD-10-CM

## 2010-03-03 NOTE — Telephone Encounter (Signed)
Atenolol - chlorthal. 50-25 id on manufacturer back order - pt is running low on med - is there something else you would like to order.  Please advise

## 2010-03-04 MED ORDER — BISOPROLOL-HYDROCHLOROTHIAZIDE 10-6.25 MG PO TABS: 1.0000 | ORAL_TABLET | Freq: Every day | ORAL | Status: DC

## 2010-03-04 NOTE — Telephone Encounter (Signed)
Generic ziac 10  #90 one daily

## 2010-04-28 ENCOUNTER — Encounter: Payer: Self-pay | Admitting: Internal Medicine

## 2010-04-29 ENCOUNTER — Encounter: Payer: Self-pay | Admitting: Internal Medicine

## 2010-04-29 ENCOUNTER — Ambulatory Visit (INDEPENDENT_AMBULATORY_CARE_PROVIDER_SITE_OTHER): Payer: BLUE CROSS/BLUE SHIELD | Admitting: Internal Medicine

## 2010-04-29 DIAGNOSIS — R002 Palpitations: Secondary | ICD-10-CM

## 2010-04-29 DIAGNOSIS — R0602 Shortness of breath: Secondary | ICD-10-CM

## 2010-04-29 DIAGNOSIS — R5383 Other fatigue: Secondary | ICD-10-CM

## 2010-04-29 DIAGNOSIS — E785 Hyperlipidemia, unspecified: Secondary | ICD-10-CM

## 2010-04-29 DIAGNOSIS — I10 Essential (primary) hypertension: Secondary | ICD-10-CM

## 2010-04-29 DIAGNOSIS — R5381 Other malaise: Secondary | ICD-10-CM

## 2010-04-29 DIAGNOSIS — I2510 Atherosclerotic heart disease of native coronary artery without angina pectoris: Secondary | ICD-10-CM

## 2010-04-29 NOTE — Patient Instructions (Signed)
Cardiac stress test as discussed Weight loss  Call or return to clinic prn if these symptoms worsen or fail to improve as anticipated.

## 2010-04-29 NOTE — Progress Notes (Signed)
  Subjective:    Patient ID: Joel Payne, male    DOB: 1945/12/10, 65 y.o.   MRN: [id number]  HPI  65 year old patient who has a history of hypertension and dyslipidemia. He has a history of minimal nonobstructive coronary artery disease and did have a heart catheterization in 2000. For the past 2 months he has had some general fatigue and presents today with a chief complaint of dyspnea on exertion. This was aggravated [place]. He has a difficult time walking up stairs due to dyspnea. He denies any palpitations but feels that his heart is beating very fast and very forceful.enies any chest pain diaphoresis or nausea;  he does experience some dizziness.    Review of Systems  Constitutional: Negative for fever, chills, appetite change and fatigue.  HENT: Negative for hearing loss, ear pain, congestion, sore throat, trouble swallowing, neck stiffness, dental problem, voice change and tinnitus.   Eyes: Negative for pain, discharge and visual disturbance.  Respiratory: Positive for shortness of breath. Negative for cough, chest tightness, wheezing and stridor.   Cardiovascular: Negative for chest pain, palpitations and leg swelling.  Gastrointestinal: Negative for nausea, vomiting, abdominal pain, diarrhea, constipation, blood in stool and abdominal distention.  Genitourinary: Negative for urgency, hematuria, flank pain, discharge, difficulty urinating and genital sores.  Musculoskeletal: Negative for myalgias, back pain, joint swelling, arthralgias and gait problem.  Skin: Negative for rash.  Neurological: Negative for dizziness, syncope, speech difficulty, weakness, numbness and headaches.  Hematological: Negative for adenopathy. Does not bruise/bleed easily.  Psychiatric/Behavioral: Negative for behavioral problems and dysphoric mood. The patient is not nervous/anxious.        Objective:   Physical Exam  Constitutional: He is oriented to person,  place, and time. He appears well-developed.       Overweight. Low normal blood pressure. Weight 210. O2 saturation 97%. This did not change after walking up and down the hall at a brisk pace  HENT:  Head: Normocephalic.  Right Ear: External ear normal.  Left Ear: External ear normal.  Eyes: Conjunctivae and EOM are normal.  Neck: Normal range of motion.  Cardiovascular: Normal rate and normal heart sounds.   Pulmonary/Chest: Breath sounds normal.  Abdominal: Bowel sounds are normal.  Musculoskeletal: Normal range of motion. He exhibits no edema and no tenderness.  Neurological: He is alert and oriented to person, place, and time.  Psychiatric: He has a normal mood and affect. His behavior is normal.          Assessment & Plan:   Dyspnea on exertion. This patient has multiple cardiac risk factors and a prior history of minimal nonobstructive coronary artery disease. We'll set up for a Cardiolite stress test. Hypertension Dyslipidemia

## 2010-05-12 ENCOUNTER — Encounter: Payer: Self-pay | Admitting: Cardiology

## 2010-05-12 ENCOUNTER — Ambulatory Visit (HOSPITAL_COMMUNITY): Payer: BC Managed Care – PPO | Attending: Internal Medicine | Admitting: Radiology

## 2010-05-12 ENCOUNTER — Encounter: Payer: Self-pay | Admitting: *Deleted

## 2010-05-12 ENCOUNTER — Ambulatory Visit: Payer: BLUE CROSS/BLUE SHIELD | Admitting: Cardiology

## 2010-05-12 ENCOUNTER — Ambulatory Visit (INDEPENDENT_AMBULATORY_CARE_PROVIDER_SITE_OTHER): Payer: BLUE CROSS/BLUE SHIELD | Admitting: Cardiology

## 2010-05-12 VITALS — BP 150/93 | HR 64 | Wt 205.0 lb

## 2010-05-12 DIAGNOSIS — R0789 Other chest pain: Secondary | ICD-10-CM

## 2010-05-12 DIAGNOSIS — R0602 Shortness of breath: Secondary | ICD-10-CM | POA: Insufficient documentation

## 2010-05-12 DIAGNOSIS — I4949 Other premature depolarization: Secondary | ICD-10-CM

## 2010-05-12 DIAGNOSIS — R072 Precordial pain: Secondary | ICD-10-CM

## 2010-05-12 DIAGNOSIS — I2510 Atherosclerotic heart disease of native coronary artery without angina pectoris: Secondary | ICD-10-CM | POA: Insufficient documentation

## 2010-05-12 DIAGNOSIS — R0609 Other forms of dyspnea: Secondary | ICD-10-CM

## 2010-05-12 MED ORDER — TECHNETIUM TC 99M TETROFOSMIN IV KIT
33.0000 | PACK | Freq: Once | INTRAVENOUS | Status: AC | PRN
  Administered 2010-05-12: 33 via INTRAVENOUS

## 2010-05-12 MED ORDER — TECHNETIUM TC 99M TETROFOSMIN IV KIT
10.8000 | PACK | Freq: Once | INTRAVENOUS | Status: AC | PRN
  Administered 2010-05-12: 11 via INTRAVENOUS

## 2010-05-12 NOTE — Progress Notes (Signed)
not

## 2010-05-12 NOTE — Assessment & Plan Note (Signed)
At this time I suspect that the patient's exertional shortness of breath he is ischemia.  I am concerned about the ventricular ectopy that he develops with exercise.  We know that he has some coronary disease from the past.  He has worsening symptoms.  Over time his exertional shortness of breath is becoming more marked.  He is on aspirin.  He has not had any rest symptoms.  I had a careful discussion with him, showing him the nuclear images. I recommended cardiac catheterization to him.  I explained the rationale for proceeding with cardiac catheter at this time.  This is new onset worsening symptomatology.  It is not a chronic stable form of abnormality.  We did feel that it was safe for him to have an outpatient catheter and this will be arranged.

## 2010-05-12 NOTE — Progress Notes (Signed)
HPI The patient is seen today in consultation for cardiac evaluation.  He has seen Dr. Amador Cunas for the evaluation of exertional shortness of breath.  The patient does have hypertension and hypercholesterolemia.  He does not have diabetes.  He does not smoke and there is no strong family history of coronary disease.  In 2000 he underwent cardiac catheterization elsewhere.  He describes having some symptoms followed by catheterization.  He was told that he had some mild disease.  He had a followup stress test that may have been a stress echo several weeks after the heart catheterization.  He was told that this was okay and he's been stable since then.  He is active.  He travels to other areas at higher elevations.  He has noted that when visiting these areas he gets a sensation of shortness of breath that seems unusual.  This has been increasing over time.  He does not have syncope or presyncope.  The patient had a stress nuclear scan today.  With exercise he did not have chest pain or significant shortness of breath.  He did not have significant EKG change.  However he developed definite ventricular bigeminy with stress that he improved in recovery.  In addition his nuclear images reveal moderate decrease in uptake in the inferior wall.  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  I do not have the wall motion analysis.  Ejection fraction was listed as 56%. No Known Allergies  Current Outpatient Prescriptions  Medication Sig Dispense Refill  . aspirin 81 MG tablet Take 81 mg by mouth daily.        Marland Kitchen atenolol-chlorthalidone (TENORETIC) 50-25 MG per tablet Take 1 tablet by mouth daily.        . simvastatin (ZOCOR) 40 MG tablet Take 40 mg by mouth daily.         No current facility-administered medications for this visit.   Facility-Administered Medications Ordered in Other Visits  Medication Dose Route Frequency Provider Last Rate Last Dose  . technetium  tetrofosmin (TC-MYOVIEW) injection 11 milli Curie  11 milli Curie Intravenous Once PRN Elyn Aquas., MD   11 milli Curie at 05/12/10 1135  . technetium tetrofosmin (TC-MYOVIEW) injection 33 milli Curie  33 milli Curie Intravenous Once PRN Elyn Aquas., MD   33 milli Curie at 05/12/10 1310    History   Social History  . Marital Status: Single    Spouse Name: N/A    Number of Children: N/A  . Years of Education: N/A   Occupational History  . Not on file.   Social History Main Topics  . Smoking status: Never Smoker   . Smokeless tobacco: Never Used  . Alcohol Use: Yes  . Drug Use: No  . Sexually Active: Not on file   Other Topics Concern  . Not on file   Social History Narrative  . No narrative on file    No family history on file.  Past Medical History  Diagnosis Date  . ALLERGIC RHINITIS 11/08/2006  . BENIGN PROSTATIC HYPERTROPHY 10/12/2006  . CORONARY ARTERY DISEASE 10/12/2006  . HYPERLIPIDEMIA 11/08/2006  . HYPERTENSION 10/12/2006  . NEPHROLITHIASIS, HX OF 10/12/2006  . Urinary frequency 10/18/2009  . PPD positive   . Shortness of breath     April, 2012  . Kidney stone     Past Surgical History  Procedure Date  . Cardiac catheterization   . Prostate biopsy     ROS  Patient denies fever, chills, headache, sweats, rash, change in vision, change in hearing, cough, nausea or vomiting, urinary symptoms.  All other systems are reviewed and are negative.  PHYSICAL EXAM Patient is oriented to person time and place.  Affect is normal.  Head is atraumatic.  There is no xanthelasma.  There are no carotid bruits.  There is no jugular venous distention.  Lungs are clear.  Respiratory effort is not labored.  Cardiac exam reveals an S1 and S2.  There are no clicks or significant murmurs.  The abdomen is soft.  There is no peripheral edema.  There are no skin rashes.  There are no musculoskeletal deformities. There were no vitals filed for this visit.  EKG   The EKG  is done today as part of the patient's stress echo study.  The resting EKG revealed no significant abnormality.  ASSESSMENT & PLAN

## 2010-05-12 NOTE — Assessment & Plan Note (Signed)
As noted in history of present illness there is a history of mild coronary disease.  I've chosen not to adjust his medications.  He is on aspirin.  His resting heart rate is not elevated.  He knows to not do any form of vigorous exercise between now and his catheterization.  He is on cholesterol medication.

## 2010-05-12 NOTE — Patient Instructions (Signed)
Labs today Your physician has requested that you have a cardiac catheterization. Cardiac catheterization is used to diagnose and/or treat various heart conditions. Doctors may recommend this procedure for a number of different reasons. The most common reason is to evaluate chest pain. Chest pain can be a symptom of coronary artery disease (CAD), and cardiac catheterization can show whether plaque is narrowing or blocking your heart's arteries. This procedure is also used to evaluate the valves, as well as measure the blood flow and oxygen levels in different parts of your heart. For further information please visit www.cardiosmart.org. Please follow instruction sheet, as given.   

## 2010-05-12 NOTE — Progress Notes (Signed)
[facility] SITE 3 NUCLEAR MED [address] [place] [address] [phone]  Cardiology Nuclear Med Study  Joel Payne is a 65 y.o. male [id number] May 05, 1945   Nuclear Med Background Indication for Stress Test:  Evaluation for Ischemia History:  '00 Cath: Mild CAD ([place]);'00 GXT after cath:Ok per patient Cardiac Risk Factors: Hypertension and Lipids  Symptoms:  Chest Tightness with Exertion (last date of chest discomfort couple days ago), Dizziness, DOE, Fatigue, Fatigue with Exertion, Light-Headedness, Palpitations and SOB   Nuclear Pre-Procedure Caffeine/Decaff Intake:  None NPO After: 6:30am   Lungs:  Clear IV 0.9% NS with Angio Cath:  18g  IV Site: R Antecubital  IV Started by:  Stanton Kidney, EMT-P  Chest Size (in):  44 Cup Size: n/a  Height: 5\' 8"  (1.727 m)  Weight:  205 lb (92.987 kg)  BMI:  Body mass index is 31.17 kg/(m^2). Tech Comments:  Tenoretic held > 24 hours, per patient    Nuclear Med Study 1 or 2 day study: 1 day  Stress Test Type:  Stress  Reading MD: Willa Rough, MD  Order Authorizing Provider:  Eleonore Chiquito, MD  Resting Radionuclide: Technetium 65m Tetrofosmin  Resting Radionuclide Dose: 10.8 mCi   Stress Radionuclide:  Technetium 78m Tetrofosmin  Stress Radionuclide Dose: 33 mCi           Stress Protocol Rest HR: 54 Stress HR: 133  Rest BP: 128/87 Stress BP: 192/70  Exercise Time (min): 10:00 METS: 11.7   Predicted Max HR: 155 bpm % Max HR: 85.81 bpm Rate Pressure Product: 86578   Dose of Adenosine (mg):  n/a Dose of Lexiscan: n/a mg  Dose of Atropine (mg): n/a Dose of Dobutamine: n/a mcg/kg/min (at max HR)  Stress Test Technologist: Irean Hong, RN  Nuclear Technologist:  Doyne Keel, CNMT     Rest Procedure:  Myocardial perfusion imaging was performed at rest 45 minutes following the intravenous administration of Technetium 18m Tetrofosmin. Rest ECG: NSR  Stress Procedure:  The patient exercised for ten  minutes, RPE=15.  The patient stopped due to fatigue and complained of chest tightness 2/10, second duration.  There were nonspecific ST-T wave changes. There were runs of bigeminy PVC's during exercise that subsided with rest.  Technetium 63m Tetrofosmin was injected at peak exercise and myocardial perfusion imaging was performed after a brief delay. Dr Willa Rough reviewed the Images and EKG's, and he will see the patient now for an office visit.  Stress ECG: The patient had ventricular bigeminy during the treadmill. There were no ST or T wave changes to suggest ischemia.   QPS Raw Data Images:  Mild diaphragmatic attenuation.  Normal left ventricular size. Stress Images:  There is attenuation of the mid and basal inferior wall with normal uptake in the remaining segments. Rest Images:  There is attenuation of the mid and basal inferior wall with normal uptake in the remaining segments Subtraction (SDS):  No evidence of ischemia.  There is a fixed defect in the mid inferior and basal inferior wall that appears to be due to diaphragmatic attenuation.  I cannot rule out a previous inferior MI.  Transient Ischemic Dilatation (Normal <1.22): .86 Lung/Heart Ratio (Normal <0.45):  .31  Quantitative Gated Spect Images QGS EDV:  99 ml QGS ESV:  44 ml QGS cine images:  NL LV Function; NL Wall Motion QGS EF: 56%  Impression Exercise Capacity:  Good exercise capacity. BP Response:  Normal blood pressure response. Clinical Symptoms:  No  chest pain. ECG Impression:  The patient had ventricular bigeminy during the treadmill.  This resolved during the later part of the treadmil.  There were no significant ST changes.  Comparison with Prior Nuclear Study: No previous nuclear study performed  Overall Impression: Abnormal nuclear myoview.  He had ventricular bigeminy with exercise that resolved in the final stages of exercise.  The SPECT images reveal a fixed inferior defect that may be due to diaphragmatic  attenuation vs. Previous  Inferior MI .    Elyn Aquas.

## 2010-05-13 LAB — BASIC METABOLIC PANEL
BUN: 13 mg/dL (ref 6–23)
CO2: 33 mEq/L — ABNORMAL HIGH (ref 19–32)
Chloride: 103 mEq/L (ref 96–112)
Creatinine, Ser: 0.9 mg/dL (ref 0.4–1.5)
GFR: 96.11 mL/min (ref >60.00)
Potassium: 3.6 mEq/L (ref 3.5–5.1)
Sodium: 143 mEq/L (ref 135–145)

## 2010-05-13 LAB — CBC WITH DIFFERENTIAL/PLATELET
Basophils Absolute: 0 10*3/uL (ref 0.0–0.1)
Eosinophils Relative: 5 % (ref 0.0–5.0)
Lymphocytes Relative: 28.2 % (ref 12.0–46.0)
MCHC: 34.5 g/dL (ref 30.0–36.0)
MCV: 89.2 fl (ref 78.0–100.0)
Monocytes Relative: 8.7 % (ref 3.0–12.0)
Neutro Abs: 2.9 10*3/uL (ref 1.4–7.7)
Platelets: 185 10*3/uL (ref 150.0–400.0)
WBC: 5.1 10*3/uL (ref 4.5–10.5)

## 2010-05-13 LAB — PROTIME-INR: INR: 1.1 ratio — ABNORMAL HIGH (ref 0.8–1.0)

## 2010-05-13 NOTE — Progress Notes (Signed)
ROUTED TO DR. KWIATKOWSKI.Mirna Mires

## 2010-05-19 ENCOUNTER — Inpatient Hospital Stay (HOSPITAL_BASED_OUTPATIENT_CLINIC_OR_DEPARTMENT_OTHER)
Admission: RE | Admit: 2010-05-19 | Discharge: 2010-05-19 | Disposition: A | Discharge status: Home / Self Care | Payer: BC Managed Care – PPO | Source: Ambulatory Visit | Attending: Cardiovascular Disease | Admitting: Cardiovascular Disease

## 2010-05-19 DIAGNOSIS — R0989 Other specified symptoms and signs involving the circulatory and respiratory systems: Secondary | ICD-10-CM | POA: Insufficient documentation

## 2010-05-19 DIAGNOSIS — R9439 Abnormal result of other cardiovascular function study: Secondary | ICD-10-CM | POA: Insufficient documentation

## 2010-05-19 DIAGNOSIS — I2510 Atherosclerotic heart disease of native coronary artery without angina pectoris: Secondary | ICD-10-CM | POA: Insufficient documentation

## 2010-05-19 DIAGNOSIS — E785 Hyperlipidemia, unspecified: Secondary | ICD-10-CM | POA: Insufficient documentation

## 2010-05-19 DIAGNOSIS — I10 Essential (primary) hypertension: Secondary | ICD-10-CM | POA: Insufficient documentation

## 2010-05-19 DIAGNOSIS — R0609 Other forms of dyspnea: Secondary | ICD-10-CM | POA: Insufficient documentation

## 2010-05-22 ENCOUNTER — Encounter: Payer: Self-pay | Admitting: Cardiology

## 2010-05-25 NOTE — Cardiovascular Report (Signed)
NAME:  Joel Payne, Joel Payne NO.:  [id number]  MEDICAL RECORD NO.:  [id number]           PATIENT TYPE:  LOCATION:                                 FACILITY:  PHYSICIAN:  Verne Carrow, MDDATE OF BIRTH:  01-21-1945  DATE OF PROCEDURE:  05/19/2010 DATE OF DISCHARGE:                           CARDIAC CATHETERIZATION   PRIMARY CARDIOLOGIST:  Luis Abed, MD, [facility]  PRIMARY CARE PHYSICIAN:  Gordy Savers, MD  PROCEDURES PERFORMED: 1. Left heart catheterization. 2. Selective coronary angiography. 3. Left ventricular angiogram.  OPERATOR:  Verne Carrow, MD  INDICATIONS:  This is a 65 year old Caucasian male with a history of hypertension, hyperlipidemia, who recently underwent a nuclear perfusion stress study for evaluation of dyspnea.  The study suggested possible inferior wall scar with small area of ischemia.  Dr. Myrtis Ser felt that the patient to arrange a diagnostic catheterization to exclude obstructive coronary artery disease.  DETAILS OF PROCEDURE:  The patient was brought to the outpatient cardiac catheterization laboratory after signing informed consent for the procedure.  The right groin was prepped and draped in sterile fashion. A 1% lidocaine was used for local anesthesia.  A 4-French sheath was inserted into the right femoral artery without difficulty.  Standard diagnostic catheters were used to perform selective coronary angiography.  A pigtail catheter was used to perform a left ventricular angiogram.  The patient tolerated the procedure well and was taken to the recovery area in stable condition.  HEMODYNAMIC FINDINGS:  Central aortic pressure 102/57, left ventricular pressure 97/2/10.  ANGIOGRAPHIC FINDINGS: 1. The left main coronary artery had no evidence of disease. 2. The left anterior descending was a large vessel that coursed to the     apex.  This vessel had mild 20-30% stenosis in the midportion just     before and  just after a moderate-sized septal perforating branch.     The distal vessel is free of any significant disease.  There was a     moderate-sized diagonal branch that arose just prior to the mild     disease in the LAD.  This vessel is free of any significant     disease.  Second diagonal branch was small in caliber. 3. The circumflex artery gave off an early small-to-moderate-sized     obtuse marginal branch that is free of disease.  Second obtuse     marginal branch was a moderate-sized vessel that was free of     disease.  The AV groove circumflex was small in caliber beyond the     takeoff of the second obtuse marginal branch.  The proximal     circumflex artery appeared to have a 20% stenosis. 4. The right coronary artery is a large dominant vessel with mild     luminal irregularities. 5. Left ventricular angiogram was performed in the RAO projection     showed normal left ventricular systolic function with ejection     fraction of 55%.  IMPRESSION: 1. Mild nonobstructive coronary artery disease. 2. Normal left ventricular systolic function.  RECOMMENDATIONS:  Medical management.     Verne Carrow, MD  CM/MEDQ  D:  05/19/2010  T:  05/19/2010  Job:  [id number]  cc:   Luis Abed, MD, [facility] Gordy Savers, MD  Electronically Signed by Verne Carrow MD on 05/25/2010 10:15:16 PM

## 2010-05-30 NOTE — Assessment & Plan Note (Signed)
[facility] OFFICE NOTE   Joel Payne, Joel Payne                      MRN:          [id number]  DATE:09/29/2005                            DOB:          18-Dec-1945    The patient is a 64 year old gentleman seen today for wellness exam.  He has  a long history of hypertension and mild dyslipidemia.  He has history of  mild BPH and elevated PSA followed closely by urology.  Doing quite well  today.  He has had a negative heart catheterization in the past, two  colonoscopies.  Also has history of positive PPD.  No allergies.   Medical regimen includes hydrochlorothiazide, atenolol and niacin.   Family history unchanged.   PHYSICAL EXAMINATION:  GENERAL:  Healthy-appearing male in no acute  distress.  VITAL SIGNS: Blood pressure 120/82.  HEENT:  Fundi, ear, nose and throat clear.  NECK:  No bruits.  CHEST: Clear.  CARDIOVASCULAR:  Normal heart sounds.  No murmurs.  ABDOMEN:  Benign.  GU: External genitalia normal.  EXTREMITIES:  Negative with full peripheral pulses.  Did have some residual  swelling involving his left lateral ankle.   IMPRESSION:  1. Hypertension.  2. Hypercholesterolemia.  3. Elevated PSA.   DISPOSITION:  Medical regimen unchanged.  Reassess in one year.                                   Gordy Savers, MD   PFK/MedQ  DD:  09/29/2005  DT:  09/30/2005  Job #:  [id number]

## 2010-06-04 ENCOUNTER — Encounter: Payer: Self-pay | Admitting: Cardiology

## 2010-06-04 DIAGNOSIS — R0602 Shortness of breath: Secondary | ICD-10-CM | POA: Insufficient documentation

## 2010-06-04 DIAGNOSIS — R0609 Other forms of dyspnea: Secondary | ICD-10-CM | POA: Insufficient documentation

## 2010-06-05 ENCOUNTER — Ambulatory Visit (INDEPENDENT_AMBULATORY_CARE_PROVIDER_SITE_OTHER): Payer: BC Managed Care – PPO | Admitting: Cardiology

## 2010-06-05 ENCOUNTER — Encounter: Payer: Self-pay | Admitting: Cardiology

## 2010-06-05 DIAGNOSIS — R0602 Shortness of breath: Secondary | ICD-10-CM

## 2010-06-05 DIAGNOSIS — I2510 Atherosclerotic heart disease of native coronary artery without angina pectoris: Secondary | ICD-10-CM

## 2010-06-05 DIAGNOSIS — I10 Essential (primary) hypertension: Secondary | ICD-10-CM

## 2010-06-05 LAB — LIPID PANEL
LDL Cholesterol: 69 mg/dL (ref 0–99)
Total CHOL/HDL Ratio: 4
VLDL: 20.8 mg/dL (ref 0.0–40.0)

## 2010-06-05 NOTE — Assessment & Plan Note (Signed)
At this time there is no ischemic basis for the patient's shortness of breath.  I've encouraged him to return to full activity and to try to lose some weight.  If he has ongoing shortness of breath issues more complete pulmonary evaluation could be considered.

## 2010-06-05 NOTE — Progress Notes (Signed)
HPI Patient returns today for followup evaluation of shortness of breath.  When I saw him last we were concerned about his nuclear scan.  Decision was made to proceed with cardiac catheterization.  This was done May 19, 2010.  Left ventricular function was normal.  There were mild 20-30% stenoses in the LAD and circumflex.  This was felt to be very mild disease.  Medical therapy is recommended.  Since the catheterization the patient is resuming his exercise.  He has lost 4 pounds. No Known Allergies  Current Outpatient Prescriptions  Medication Sig Dispense Refill  . aspirin 81 MG tablet Take 81 mg by mouth daily.        Marland Kitchen atenolol-chlorthalidone (TENORETIC) 50-25 MG per tablet Take 1 tablet by mouth daily.        . ciprofloxacin (CIPRO) 500 MG tablet Take 500 mg by mouth 2 (two) times daily.        . simvastatin (ZOCOR) 40 MG tablet Take 40 mg by mouth daily.          History   Social History  . Marital Status: Single    Spouse Name: N/A    Number of Children: N/A  . Years of Education: N/A   Occupational History  . Not on file.   Social History Main Topics  . Smoking status: Never Smoker   . Smokeless tobacco: Never Used  . Alcohol Use: Yes  . Drug Use: No  . Sexually Active: Not on file   Other Topics Concern  . Not on file   Social History Narrative  . No narrative on file    No family history on file.  Past Medical History  Diagnosis Date  . ALLERGIC RHINITIS 11/08/2006  . BENIGN PROSTATIC HYPERTROPHY 10/12/2006  . CORONARY ARTERY DISEASE 10/12/2006    Catheterization was normal 2012, mild nonobstructive coronary disease, normal LV function  . HYPERLIPIDEMIA 11/08/2006  . HYPERTENSION 10/12/2006  . NEPHROLITHIASIS, HX OF 10/12/2006  . Urinary frequency 10/18/2009  . PPD positive   . Shortness of breath     April, 2012  /  catheterization May, 2012 mild nonobstructive coronary disease  . Kidney stone     Past Surgical History  Procedure Date  . Cardiac  catheterization   . Prostate biopsy     ROS  Patient denies fever, chills, headache, sweats, rash, change in vision, change in hearing, chest pain, cough, nausea vomiting, urinary symptoms.  All other systems are reviewed and are negative.  PHYSICAL EXAM Patient is stable.  Head is atraumatic.  Lungs are clear.  Respiratory effort is unlabored.  Cardiac exam reveals S1-S2.  No clicks or significant murmurs.  Abdomen soft.  There is no peripheral edema. Filed Vitals:   06/05/10 0949  BP: 131/81  Pulse: 55  Resp: 14  Height: 5\' 8"  (1.727 m)  Weight: 203 lb (92.08 kg)    EKG is not done today. ASSESSMENT & PLAN

## 2010-06-05 NOTE — Patient Instructions (Signed)
Dr Myrtis Ser has recommended that you start an Omega 3 Fish Oil 1000mg  daily. Your physician recommends that you return for a FASTING Lipid Panel: Today Your physician wants you to follow-up in: 1 year.  You will receive a reminder letter in the mail two months in advance. If you don't receive a letter, please call our office to schedule the follow-up appointment.

## 2010-06-05 NOTE — Assessment & Plan Note (Signed)
He does have very mild coronary disease.  He is on a statin.  His last lipid profile was done in November, 2011.  LDL was 85 at that time.  HDL was 34 and triglycerides 122.  I am suggesting a followup lipid profile.  I am also going to suggest that he start Omega 3 preparation.

## 2010-06-05 NOTE — Assessment & Plan Note (Signed)
Blood pressure is well-controlled at this time.  No change in therapy. 

## 2010-06-06 ENCOUNTER — Encounter: Payer: Self-pay | Admitting: *Deleted

## 2010-07-21 ENCOUNTER — Ambulatory Visit (INDEPENDENT_AMBULATORY_CARE_PROVIDER_SITE_OTHER): Payer: BC Managed Care – PPO | Admitting: Internal Medicine

## 2010-07-21 ENCOUNTER — Encounter: Payer: Self-pay | Admitting: Internal Medicine

## 2010-07-21 DIAGNOSIS — IMO0002 Reserved for concepts with insufficient information to code with codable children: Secondary | ICD-10-CM

## 2010-07-21 DIAGNOSIS — L02619 Cutaneous abscess of unspecified foot: Secondary | ICD-10-CM

## 2010-07-21 DIAGNOSIS — Z0000 Encounter for general adult medical examination without abnormal findings: Secondary | ICD-10-CM

## 2010-07-21 DIAGNOSIS — I10 Essential (primary) hypertension: Secondary | ICD-10-CM

## 2010-07-21 MED ORDER — CEPHALEXIN 500 MG PO CAPS: 500.0000 mg | ORAL_CAPSULE | Freq: Four times a day (QID) | ORAL | Status: AC

## 2010-07-21 NOTE — Patient Instructions (Signed)
Take your antibiotic as prescribed until ALL of it is gone, but stop if you develop a rash, swelling, or any side effects of the medication.  Contact our office as soon as possible if  there are side effects of the medication.  Call or return to clinic prn if these symptoms worsen or fail to improve as anticipated.  

## 2010-07-21 NOTE — Progress Notes (Signed)
  Subjective:    Patient ID: Joel Payne, male    DOB: October 27, 1945, 65 y.o.   MRN: [id number]  HPI  65 year old patient presents with a three-day history of speech and manic pain and swelling involving the right toe. Pain is slightly improved today. No fever or systemic complaints. He has treated hypertension which has been stable. His only other complaint is some postnasal drip and frequent coughing over the past 2 months.  He is requesting a referral to dermatology for a general evaluation   Review of Systems  Constitutional: Negative for fever, chills, appetite change and fatigue.  HENT: Positive for postnasal drip. Negative for hearing loss, ear pain, congestion, sore throat, trouble swallowing, neck stiffness, dental problem, voice change and tinnitus.   Eyes: Negative for pain, discharge and visual disturbance.  Respiratory: Positive for cough. Negative for chest tightness, wheezing and stridor.   Cardiovascular: Negative for chest pain, palpitations and leg swelling.  Gastrointestinal: Negative for nausea, vomiting, abdominal pain, diarrhea, constipation, blood in stool and abdominal distention.  Genitourinary: Negative for urgency, hematuria, flank pain, discharge, difficulty urinating and genital sores.  Musculoskeletal: Negative for myalgias, back pain, joint swelling, arthralgias and gait problem.  Skin: Negative for rash.  Neurological: Negative for dizziness, syncope, speech difficulty, weakness, numbness and headaches.  Hematological: Negative for adenopathy. Does not bruise/bleed easily.  Psychiatric/Behavioral: Negative for behavioral problems and dysphoric mood. The patient is not nervous/anxious.        Objective:   Physical Exam  Constitutional: He is oriented to person, place, and time. He appears well-developed.  HENT:  Head: Normocephalic.  Right Ear: External ear normal.  Left Ear: External ear normal.  Eyes: Conjunctivae and EOM are normal.  Neck: Normal  range of motion. Neck supple.  Cardiovascular: Normal rate and normal heart sounds.   Pulmonary/Chest: Effort normal and breath sounds normal. No respiratory distress. He has no wheezes. He has no rales.  Abdominal: Bowel sounds are normal.  Musculoskeletal: Normal range of motion. He exhibits no edema and no tenderness.  Lymphadenopathy:    He has no cervical adenopathy.  Neurological: He is alert and oriented to person, place, and time.  Skin:       The right great toe was swollen and slightly tender and erythematous. No tenderness about the right first MTP joint. No obvious paronychia  Psychiatric: He has a normal mood and affect. His behavior is normal.          Assessment & Plan:   Cellulitis right great toe. Will treat with cephalexin for 7 days Hypertension stable Cough probably secondary to upper airway syndrome. We'll try a nonsedating antihistamine and observe

## 2010-10-13 DIAGNOSIS — N400 Enlarged prostate without lower urinary tract symptoms: Secondary | ICD-10-CM | POA: Insufficient documentation

## 2010-10-13 DIAGNOSIS — R972 Elevated prostate specific antigen [PSA]: Secondary | ICD-10-CM | POA: Insufficient documentation

## 2010-10-24 ENCOUNTER — Encounter: Payer: Self-pay | Admitting: Gastroenterology

## 2010-10-24 ENCOUNTER — Telehealth: Payer: Self-pay | Admitting: *Deleted

## 2010-10-24 DIAGNOSIS — Z1211 Encounter for screening for malignant neoplasm of colon: Secondary | ICD-10-CM

## 2010-10-24 NOTE — Telephone Encounter (Signed)
Pt states he was told by Dr. Kirtland Bouchard to call in to schedule appt for colonoscopy.  Please enter referral order to GI for colonoscopy.  Pt notified we would contact him with appt details after we get order from md.

## 2010-11-28 ENCOUNTER — Other Ambulatory Visit: Payer: BC Managed Care – PPO | Admitting: Gastroenterology

## 2010-12-20 ENCOUNTER — Other Ambulatory Visit: Payer: Self-pay | Admitting: Internal Medicine

## 2011-01-30 ENCOUNTER — Other Ambulatory Visit: Payer: Self-pay

## 2011-01-30 MED ORDER — SIMVASTATIN 40 MG PO TABS: 40.0000 mg | ORAL_TABLET | Freq: Every day | ORAL | Status: DC

## 2011-03-30 ENCOUNTER — Other Ambulatory Visit (INDEPENDENT_AMBULATORY_CARE_PROVIDER_SITE_OTHER): Payer: BC Managed Care – PPO

## 2011-03-30 DIAGNOSIS — Z0000 Encounter for general adult medical examination without abnormal findings: Secondary | ICD-10-CM

## 2011-03-30 LAB — HEPATIC FUNCTION PANEL
ALT: 33 U/L (ref 0–53)
AST: 31 U/L (ref 0–37)
Alkaline Phosphatase: 43 U/L (ref 39–117)
Total Protein: 6.9 g/dL (ref 6.0–8.3)

## 2011-03-30 LAB — CBC WITH DIFFERENTIAL/PLATELET
HCT: 46.3 % (ref 39.0–52.0)
Lymphs Abs: 1.4 10*3/uL (ref 0.7–4.0)
Monocytes Relative: 9.8 % (ref 3.0–12.0)
Neutro Abs: 2.5 10*3/uL (ref 1.4–7.7)
RBC: 5.16 Mil/uL (ref 4.22–5.81)
RDW: 13.2 % (ref 11.5–14.6)

## 2011-03-30 LAB — POCT URINALYSIS DIPSTICK
Glucose, UA: NEGATIVE
Spec Grav, UA: 1.015
Urobilinogen, UA: 0.2

## 2011-03-30 LAB — BASIC METABOLIC PANEL
BUN: 13 mg/dL (ref 6–23)
CO2: 31 mEq/L (ref 19–32)
Chloride: 102 mEq/L (ref 96–112)
GFR: 98.52 mL/min (ref >60.00)
Glucose, Bld: 94 mg/dL (ref 70–99)
Potassium: 3.3 mEq/L — ABNORMAL LOW (ref 3.5–5.1)
Sodium: 142 mEq/L (ref 135–145)

## 2011-03-30 LAB — PSA: PSA: 8.99 ng/mL — ABNORMAL HIGH (ref 0.10–4.00)

## 2011-03-30 LAB — LIPID PANEL: Total CHOL/HDL Ratio: 6

## 2011-04-06 ENCOUNTER — Ambulatory Visit (INDEPENDENT_AMBULATORY_CARE_PROVIDER_SITE_OTHER): Payer: BC Managed Care – PPO | Admitting: Internal Medicine

## 2011-04-06 ENCOUNTER — Encounter: Payer: Self-pay | Admitting: Internal Medicine

## 2011-04-06 VITALS — BP 120/80 | HR 69 | Temp 98.4°F | Resp 18 | Ht 68.0 in | Wt 214.0 lb

## 2011-04-06 DIAGNOSIS — E785 Hyperlipidemia, unspecified: Secondary | ICD-10-CM

## 2011-04-06 DIAGNOSIS — N400 Enlarged prostate without lower urinary tract symptoms: Secondary | ICD-10-CM

## 2011-04-06 DIAGNOSIS — Z23 Encounter for immunization: Secondary | ICD-10-CM

## 2011-04-06 DIAGNOSIS — I10 Essential (primary) hypertension: Secondary | ICD-10-CM

## 2011-04-06 DIAGNOSIS — R972 Elevated prostate specific antigen [PSA]: Secondary | ICD-10-CM

## 2011-04-06 DIAGNOSIS — I2510 Atherosclerotic heart disease of native coronary artery without angina pectoris: Secondary | ICD-10-CM

## 2011-04-06 DIAGNOSIS — Z0000 Encounter for general adult medical examination without abnormal findings: Secondary | ICD-10-CM

## 2011-04-06 MED ORDER — ATENOLOL-CHLORTHALIDONE 50-25 MG PO TABS: 1.0000 | ORAL_TABLET | Freq: Every day | ORAL | Status: DC

## 2011-04-06 MED ORDER — SIMVASTATIN 40 MG PO TABS: 40.0000 mg | ORAL_TABLET | Freq: Every day | ORAL | Status: DC

## 2011-04-06 NOTE — Progress Notes (Signed)
Subjective:    Patient ID: Joel Payne, male    DOB: 12/11/1945, 66 y.o.   MRN: [id number]  HPI A 66 year old patient who was seen today for a preventive health examination. He is followed by cardiology for nonobstructive coronary artery disease. He is doing quite well. He has hypertension and dyslipidemia. He is also followed by urology for BPH and elevated PSA. He also has a history of nephrolithiasis  Past Medical History  Diagnosis Date  . ALLERGIC RHINITIS 11/08/2006  . BENIGN PROSTATIC HYPERTROPHY 10/12/2006  . CORONARY ARTERY DISEASE 10/12/2006    Catheterization was normal 2012, mild nonobstructive coronary disease, normal LV function  . HYPERLIPIDEMIA 11/08/2006  . HYPERTENSION 10/12/2006  . NEPHROLITHIASIS, HX OF 10/12/2006  . Urinary frequency 10/18/2009  . PPD positive   . Shortness of breath     April, 2012  /  catheterization May, 2012 mild nonobstructive coronary disease  . Kidney stone     History   Social History  . Marital Status: Single    Spouse Name: N/A    Number of Children: N/A  . Years of Education: N/A   Occupational History  . Not on file.   Social History Main Topics  . Smoking status: Never Smoker   . Smokeless tobacco: Never Used  . Alcohol Use: Yes  . Drug Use: No  . Sexually Active: Not on file   Other Topics Concern  . Not on file   Social History Narrative  . No narrative on file    Past Surgical History  Procedure Date  . Cardiac catheterization   . Prostate biopsy     No family history on file.  No Known Allergies  Current Outpatient Prescriptions on File Prior to Visit  Medication Sig Dispense Refill  . aspirin 81 MG tablet Take 81 mg by mouth daily.        . OMEGA 3 1000 MG CAPS Take by mouth daily.          BP 120/80  Pulse 69  Temp(Src) 98.4 F (36.9 C) (Oral)  Resp 18  Ht 5\' 8"  (1.727 m)  Wt 214 lb (97.07 kg)  BMI 32.54 kg/m2  SpO2 98%      Review of Systems  Constitutional: Negative for fever,  chills, activity change, appetite change and fatigue.  HENT: Negative for hearing loss, ear pain, congestion, rhinorrhea, sneezing, mouth sores, trouble swallowing, neck pain, neck stiffness, dental problem, voice change, sinus pressure and tinnitus.   Eyes: Negative for photophobia, pain, redness and visual disturbance.  Respiratory: Negative for apnea, cough, choking, chest tightness, shortness of breath and wheezing.   Cardiovascular: Negative for chest pain, palpitations and leg swelling.  Gastrointestinal: Negative for nausea, vomiting, abdominal pain, diarrhea, constipation, blood in stool, abdominal distention, anal bleeding and rectal pain.  Genitourinary: Negative for dysuria, urgency, frequency, hematuria, flank pain, decreased urine volume, discharge, penile swelling, scrotal swelling, difficulty urinating, genital sores and testicular pain.  Musculoskeletal: Negative for myalgias, back pain, joint swelling, arthralgias and gait problem.  Skin: Negative for color change, rash and wound.  Neurological: Negative for dizziness, tremors, seizures, syncope, facial asymmetry, speech difficulty, weakness, light-headedness, numbness and headaches.  Hematological: Negative for adenopathy. Does not bruise/bleed easily.  Psychiatric/Behavioral: Negative for suicidal ideas, hallucinations, behavioral problems, confusion, sleep disturbance, self-injury, dysphoric mood, decreased concentration and agitation. The patient is not nervous/anxious.        Objective:   Physical Exam  Constitutional: He is oriented to person, place, and time. He  appears well-developed and well-nourished.  HENT:  Head: Normocephalic and atraumatic.  Right Ear: External ear normal.  Left Ear: External ear normal.  Nose: Nose normal.  Mouth/Throat: Oropharynx is clear and moist.  Eyes: Conjunctivae and EOM are normal. Pupils are equal, round, and reactive to light. No scleral icterus.  Neck: Normal range of motion. Neck  supple. No JVD present. No thyromegaly present.  Cardiovascular: Normal rate, regular rhythm, normal heart sounds and intact distal pulses.  Exam reveals no gallop and no friction rub.   No murmur heard. Pulmonary/Chest: Effort normal and breath sounds normal. He exhibits no tenderness.  Abdominal: Soft. Bowel sounds are normal. He exhibits no distension and no mass. There is no tenderness.  Genitourinary: Prostate normal and penis normal.  Musculoskeletal: Normal range of motion. He exhibits no edema and no tenderness.  Lymphadenopathy:    He has no cervical adenopathy.  Neurological: He is alert and oriented to person, place, and time. He has normal reflexes. No cranial nerve deficit. Coordination normal.  Skin: Skin is warm and dry. No rash noted.  Psychiatric: He has a normal mood and affect. His behavior is normal.          Assessment & Plan:    Preventive health examination Hypertension well controlled Dyslipidemia. Improved diet more regular exercise weight loss all encouraged

## 2011-04-06 NOTE — Patient Instructions (Signed)
Limit your sodium (Salt) intake    It is important that you exercise regularly, at least 20 minutes 3 to 4 times per week.  If you develop chest pain or shortness of breath seek  medical attention.  Please check your blood pressure on a regular basis.  If it is consistently greater than 150/90, please make an office appointment.  Return in one year for follow-up  

## 2011-08-13 HISTORY — PX: OTHER SURGICAL HISTORY: SHX169

## 2011-08-26 DIAGNOSIS — R339 Retention of urine, unspecified: Secondary | ICD-10-CM | POA: Insufficient documentation

## 2011-09-07 ENCOUNTER — Encounter (HOSPITAL_COMMUNITY): Payer: Self-pay | Admitting: Adult Health

## 2011-09-07 ENCOUNTER — Emergency Department (HOSPITAL_COMMUNITY): Payer: BC Managed Care – PPO

## 2011-09-07 ENCOUNTER — Inpatient Hospital Stay (HOSPITAL_COMMUNITY)
Admission: EM | Admit: 2011-09-07 | Discharge: 2011-09-10 | DRG: 320 | Disposition: A | Discharge status: Home / Self Care | Payer: BC Managed Care – PPO | Attending: Internal Medicine | Admitting: Internal Medicine

## 2011-09-07 DIAGNOSIS — E785 Hyperlipidemia, unspecified: Secondary | ICD-10-CM | POA: Diagnosis present

## 2011-09-07 DIAGNOSIS — I959 Hypotension, unspecified: Secondary | ICD-10-CM | POA: Diagnosis present

## 2011-09-07 DIAGNOSIS — D649 Anemia, unspecified: Secondary | ICD-10-CM | POA: Diagnosis not present

## 2011-09-07 DIAGNOSIS — E871 Hypo-osmolality and hyponatremia: Secondary | ICD-10-CM | POA: Diagnosis present

## 2011-09-07 DIAGNOSIS — I10 Essential (primary) hypertension: Secondary | ICD-10-CM | POA: Diagnosis present

## 2011-09-07 DIAGNOSIS — R350 Frequency of micturition: Secondary | ICD-10-CM

## 2011-09-07 DIAGNOSIS — R509 Fever, unspecified: Secondary | ICD-10-CM | POA: Diagnosis present

## 2011-09-07 DIAGNOSIS — Z87442 Personal history of urinary calculi: Secondary | ICD-10-CM

## 2011-09-07 DIAGNOSIS — R7989 Other specified abnormal findings of blood chemistry: Secondary | ICD-10-CM

## 2011-09-07 DIAGNOSIS — R972 Elevated prostate specific antigen [PSA]: Secondary | ICD-10-CM

## 2011-09-07 DIAGNOSIS — N401 Enlarged prostate with lower urinary tract symptoms: Secondary | ICD-10-CM | POA: Diagnosis present

## 2011-09-07 DIAGNOSIS — I2510 Atherosclerotic heart disease of native coronary artery without angina pectoris: Secondary | ICD-10-CM | POA: Diagnosis present

## 2011-09-07 DIAGNOSIS — N390 Urinary tract infection, site not specified: Principal | ICD-10-CM | POA: Diagnosis present

## 2011-09-07 DIAGNOSIS — Z7982 Long term (current) use of aspirin: Secondary | ICD-10-CM

## 2011-09-07 DIAGNOSIS — N138 Other obstructive and reflux uropathy: Secondary | ICD-10-CM | POA: Diagnosis present

## 2011-09-07 DIAGNOSIS — J309 Allergic rhinitis, unspecified: Secondary | ICD-10-CM

## 2011-09-07 DIAGNOSIS — Z79899 Other long term (current) drug therapy: Secondary | ICD-10-CM

## 2011-09-07 DIAGNOSIS — E876 Hypokalemia: Secondary | ICD-10-CM | POA: Diagnosis present

## 2011-09-07 DIAGNOSIS — N400 Enlarged prostate without lower urinary tract symptoms: Secondary | ICD-10-CM | POA: Diagnosis present

## 2011-09-07 DIAGNOSIS — D72829 Elevated white blood cell count, unspecified: Secondary | ICD-10-CM | POA: Diagnosis present

## 2011-09-07 DIAGNOSIS — E860 Dehydration: Secondary | ICD-10-CM | POA: Diagnosis present

## 2011-09-07 DIAGNOSIS — B9689 Other specified bacterial agents as the cause of diseases classified elsewhere: Secondary | ICD-10-CM | POA: Diagnosis present

## 2011-09-07 DIAGNOSIS — R339 Retention of urine, unspecified: Secondary | ICD-10-CM | POA: Diagnosis present

## 2011-09-07 DIAGNOSIS — R0602 Shortness of breath: Secondary | ICD-10-CM

## 2011-09-07 LAB — URINALYSIS, ROUTINE W REFLEX MICROSCOPIC
Glucose, UA: NEGATIVE mg/dL
Nitrite: NEGATIVE
Protein, ur: 100 mg/dL — AB

## 2011-09-07 LAB — BASIC METABOLIC PANEL
CO2: 26 mEq/L (ref 19–32)
Chloride: 93 mEq/L — ABNORMAL LOW (ref 96–112)
GFR calc Af Amer: 56 mL/min — ABNORMAL LOW (ref >90)
Potassium: 2.6 mEq/L — CL (ref 3.5–5.1)
Sodium: 132 mEq/L — ABNORMAL LOW (ref 135–145)

## 2011-09-07 LAB — URINE MICROSCOPIC-ADD ON

## 2011-09-07 LAB — CBC WITH DIFFERENTIAL/PLATELET
Basophils Absolute: 0 10*3/uL (ref 0.0–0.1)
Basophils Relative: 0 % (ref 0–1)
HCT: 37.2 % — ABNORMAL LOW (ref 39.0–52.0)
Lymphocytes Relative: 2 % — ABNORMAL LOW (ref 12–46)
Neutro Abs: 10.2 10*3/uL — ABNORMAL HIGH (ref 1.7–7.7)
Neutrophils Relative %: 94 % — ABNORMAL HIGH (ref 43–77)
Platelets: 166 10*3/uL (ref 150–400)
RDW: 12.5 % (ref 11.5–15.5)
WBC: 10.8 10*3/uL — ABNORMAL HIGH (ref 4.0–10.5)

## 2011-09-07 MED ORDER — ACETAMINOPHEN 325 MG PO TABS
650.0000 mg | ORAL_TABLET | Freq: Once | ORAL | Status: AC
Start: 2011-09-07 — End: 2011-09-07
  Administered 2011-09-07: 650 mg via ORAL

## 2011-09-07 MED ORDER — POTASSIUM CHLORIDE CRYS ER 20 MEQ PO TBCR
40.0000 meq | EXTENDED_RELEASE_TABLET | Freq: Once | ORAL | Status: AC
  Administered 2011-09-07: 40 meq via ORAL
  Filled 2011-09-07: qty 2

## 2011-09-07 MED ORDER — DEXTROSE 5 % IV SOLN
1.0000 g | Freq: Once | INTRAVENOUS | Status: AC
  Administered 2011-09-07: 1 g via INTRAVENOUS
  Filled 2011-09-07: qty 10

## 2011-09-07 MED ORDER — SODIUM CHLORIDE 0.9 % IV BOLUS (SEPSIS)
1000.0000 mL | Freq: Once | INTRAVENOUS | Status: AC
  Administered 2011-09-07: 1000 mL via INTRAVENOUS

## 2011-09-07 MED ORDER — ACETAMINOPHEN 325 MG PO TABS
ORAL_TABLET | ORAL | Status: AC
  Filled 2011-09-07: qty 2

## 2011-09-07 NOTE — ED Notes (Signed)
Pt states symptoms started after most recent catheter was placed on Friday. Per pts wife she called [facility] to tell about pts fever of 103 and was told to come here.

## 2011-09-07 NOTE — ED Notes (Signed)
Pt denies pain, nausea, vomiting, dizziness. Pts wife states he is confused and acting different than he normally does states "he was just not acting smart and was kind of loopy." Pt states "I was just not feeling with it."

## 2011-09-07 NOTE — ED Provider Notes (Signed)
History     CSN: [id number]  Arrival date & time 09/07/11  2002   First MD Initiated Contact with Patient 09/07/11 2257      No chief complaint on file.   (Consider location/radiation/quality/duration/timing/severity/associated sxs/prior treatment) The history is provided by the spouse, the patient and medical records.   SIRR KABEL is a 66 y.o. male presents to the emergency department complaining of fever and chills.  The onset of the symptoms was  gradual starting 1 day ago.  The patient has associated burning of the urethra, and an occasional feeling of pressure in the kidneys.  The symptoms have been  persistent, gradually worsened.  Nothing makes the symptoms worse and nothing makes symptoms better.  The patient denies chest pain, shortness of breath, abdominal pain, dizziness, syncope.  Patient states problems with obstruction of urethra secondary to a BPH beginning July 14.  Seen by Dr. Earlene Plater urology and initially given Foley catheter.  After initial Foley catheter, patient was discharged home with in and out cath as necessary.  He was unable to pass and out cath therefore 3 days ago probably catheter was inserted.  Patient states chills on Saturday with resolution of symptoms on Sunday.  Fever to 103 and chills recurred this morning.  Patient consulted with Dr. Lucretia Roers who is on call for [facility] and was sent here.  Patient is also complaining of chronic cough x1 year.  Evaluate for cough by ENT and given a diagnosis of allergies.   The patient has medical history significant for:  Past Medical History  Diagnosis Date  . ALLERGIC RHINITIS 11/08/2006  . BENIGN PROSTATIC HYPERTROPHY 10/12/2006  . CORONARY ARTERY DISEASE 10/12/2006    Catheterization was normal 2012, mild nonobstructive coronary disease, normal LV function  . HYPERLIPIDEMIA 11/08/2006  . HYPERTENSION 10/12/2006  . NEPHROLITHIASIS, HX OF 10/12/2006  . Urinary frequency 10/18/2009  . PPD positive   .  Shortness of breath     April, 2012  /  catheterization May, 2012 mild nonobstructive coronary disease  . Kidney stone      Past Medical History  Diagnosis Date  . ALLERGIC RHINITIS 11/08/2006  . BENIGN PROSTATIC HYPERTROPHY 10/12/2006  . CORONARY ARTERY DISEASE 10/12/2006    Catheterization was normal 2012, mild nonobstructive coronary disease, normal LV function  . HYPERLIPIDEMIA 11/08/2006  . HYPERTENSION 10/12/2006  . NEPHROLITHIASIS, HX OF 10/12/2006  . Urinary frequency 10/18/2009  . PPD positive   . Shortness of breath     April, 2012  /  catheterization May, 2012 mild nonobstructive coronary disease  . Kidney stone     Past Surgical History  Procedure Date  . Cardiac catheterization   . Prostate biopsy     History reviewed. No pertinent family history.  History  Substance Use Topics  . Smoking status: Never Smoker   . Smokeless tobacco: Never Used  . Alcohol Use: Yes      Review of Systems  Constitutional: Positive for fever. Negative for diaphoresis, appetite change, fatigue and unexpected weight change.  HENT: Negative for mouth sores, trouble swallowing, neck pain and neck stiffness.   Respiratory: Positive for cough. Negative for chest tightness, shortness of breath, wheezing and stridor.   Cardiovascular: Negative for chest pain and palpitations.  Gastrointestinal: Negative for nausea, vomiting, abdominal pain, diarrhea, constipation, blood in stool, abdominal distention and rectal pain.  Genitourinary: Positive for dysuria and flank pain (described as pressure). Negative for urgency, frequency, hematuria and difficulty urinating.  Musculoskeletal: Negative for  back pain.  Skin: Negative for rash.  Neurological: Negative for weakness.  Hematological: Negative for adenopathy.  Psychiatric/Behavioral: Negative for confusion.  All other systems reviewed and are negative.    Allergies  Simvastatin  Home Medications   Current Outpatient Rx  Name Route  Sig Dispense Refill  . ASPIRIN EC 81 MG PO TBEC Oral Take 81 mg by mouth daily.    . ATENOLOL-CHLORTHALIDONE 50-25 MG PO TABS Oral Take 1 tablet by mouth daily. 90 tablet 6  . OMEGA-3-ACID ETHYL ESTERS 1 G PO CAPS Oral Take 1 g by mouth daily.    Marland Kitchen TAMSULOSIN HCL 0.4 MG PO CAPS Oral Take 0.4 mg by mouth daily after supper.      BP 96/54  Pulse 84  Temp 99.3 F (37.4 C) (Oral)  Resp 18  SpO2 95%  Physical Exam  Nursing note and vitals reviewed. Constitutional: He appears well-developed and well-nourished. No distress.  HENT:  Head: Normocephalic and atraumatic.  Mouth/Throat: Oropharynx is clear and moist. No oropharyngeal exudate.  Eyes: Conjunctivae are normal. No scleral icterus.  Neck: Normal range of motion. Neck supple.  Cardiovascular: Normal rate, regular rhythm, normal heart sounds and intact distal pulses.  Exam reveals no gallop and no friction rub.   No murmur heard. Pulmonary/Chest: Effort normal and breath sounds normal. No respiratory distress. He has no wheezes.  Abdominal: Soft. Bowel sounds are normal. He exhibits no mass. There is no tenderness. There is no rebound and no guarding.  Musculoskeletal: Normal range of motion. He exhibits no edema.  Neurological: He is alert.       Speech is clear and goal oriented Moves extremities without ataxia  Skin: Skin is warm and dry. No rash noted. He is not diaphoretic.  Psychiatric: He has a normal mood and affect.    ED Course  Procedures (including critical care time)  Labs Reviewed  URINALYSIS, ROUTINE W REFLEX MICROSCOPIC - Abnormal; Notable for the following:    Color, Urine AMBER (*)  BIOCHEMICALS MAY BE AFFECTED BY COLOR   APPearance CLOUDY (*)     Hgb urine dipstick LARGE (*)     Ketones, ur 15 (*)     Protein, ur 100 (*)     Leukocytes, UA MODERATE (*)     All other components within normal limits  CBC WITH DIFFERENTIAL - Abnormal; Notable for the following:    WBC 10.8 (*)     HCT 37.2 (*)     MCHC  36.6 (*)     Neutrophils Relative 94 (*)     Neutro Abs 10.2 (*)     Lymphocytes Relative 2 (*)     Lymphs Abs 0.3 (*)     All other components within normal limits  BASIC METABOLIC PANEL - Abnormal; Notable for the following:    Sodium 132 (*)     Potassium 2.6 (*)     Chloride 93 (*)     Glucose, Bld 111 (*)     BUN 27 (*)     Creatinine, Ser 1.46 (*)     GFR calc non Af Amer 48 (*)     GFR calc Af Amer 56 (*)     All other components within normal limits  URINE MICROSCOPIC-ADD ON - Abnormal; Notable for the following:    Bacteria, UA MANY (*)     All other components within normal limits  LACTIC ACID, PLASMA  URINE CULTURE   Dg Chest Portable 1 View  09/07/2011  *RADIOLOGY  REPORT*  Clinical Data: Fever and cough.  PORTABLE CHEST - 1 VIEW  Comparison: None.  Findings: Lungs are clear.  Heart size is normal.  No pneumothorax or pleural fluid.  IMPRESSION: No acute disease.   Original Report Authenticated By: Bernadene Bell. Maricela Curet, M.D.     Results for orders placed during the hospital encounter of 09/07/11  URINALYSIS, ROUTINE W REFLEX MICROSCOPIC      Component Value Range   Color, Urine AMBER (*) YELLOW   APPearance CLOUDY (*) CLEAR   Specific Gravity, Urine 1.020  1.005 - 1.030   pH 5.5  5.0 - 8.0   Glucose, UA NEGATIVE  NEGATIVE mg/dL   Hgb urine dipstick LARGE (*) NEGATIVE   Bilirubin Urine NEGATIVE  NEGATIVE   Ketones, ur 15 (*) NEGATIVE mg/dL   Protein, ur 161 (*) NEGATIVE mg/dL   Urobilinogen, UA 1.0  0.0 - 1.0 mg/dL   Nitrite NEGATIVE  NEGATIVE   Leukocytes, UA MODERATE (*) NEGATIVE  CBC WITH DIFFERENTIAL      Component Value Range   WBC 10.8 (*) 4.0 - 10.5 K/uL   RBC 4.49  4.22 - 5.81 MIL/uL   Hemoglobin 13.6  13.0 - 17.0 g/dL   HCT 09.6 (*) 04.5 - 40.9 %   MCV 82.9  78.0 - 100.0 fL   MCH 30.3  26.0 - 34.0 pg   MCHC 36.6 (*) 30.0 - 36.0 g/dL   RDW 81.1  91.4 - 78.2 %   Platelets 166  150 - 400 K/uL   Neutrophils Relative 94 (*) 43 - 77 %   Neutro Abs 10.2  (*) 1.7 - 7.7 K/uL   Lymphocytes Relative 2 (*) 12 - 46 %   Lymphs Abs 0.3 (*) 0.7 - 4.0 K/uL   Monocytes Relative 3  3 - 12 %   Monocytes Absolute 0.3  0.1 - 1.0 K/uL   Eosinophils Relative 1  0 - 5 %   Eosinophils Absolute 0.1  0.0 - 0.7 K/uL   Basophils Relative 0  0 - 1 %   Basophils Absolute 0.0  0.0 - 0.1 K/uL  BASIC METABOLIC PANEL      Component Value Range   Sodium 132 (*) 135 - 145 mEq/L   Potassium 2.6 (*) 3.5 - 5.1 mEq/L   Chloride 93 (*) 96 - 112 mEq/L   CO2 26  19 - 32 mEq/L   Glucose, Bld 111 (*) 70 - 99 mg/dL   BUN 27 (*) 6 - 23 mg/dL   Creatinine, Ser 9.56 (*) 0.50 - 1.35 mg/dL   Calcium 9.3  8.4 - 21.3 mg/dL   GFR calc non Af Amer 48 (*) >90 mL/min   GFR calc Af Amer 56 (*) >90 mL/min  URINE MICROSCOPIC-ADD ON      Component Value Range   Squamous Epithelial / LPF RARE  RARE   WBC, UA 21-50  <3 WBC/hpf   RBC / HPF TOO NUMEROUS TO COUNT  <3 RBC/hpf   Bacteria, UA MANY (*) RARE   Urine-Other MUCOUS PRESENT    LACTIC ACID, PLASMA      Component Value Range   Lactic Acid, Venous 1.9  0.5 - 2.2 mmol/L   Dg Chest Portable 1 View  09/07/2011  *RADIOLOGY REPORT*  Clinical Data: Fever and cough.  PORTABLE CHEST - 1 VIEW  Comparison: None.  Findings: Lungs are clear.  Heart size is normal.  No pneumothorax or pleural fluid.  IMPRESSION: No acute disease.   Original Report Authenticated  By: Bernadene Bell D'ALESSIO, M.D.       1. UTI (lower urinary tract infection)   2. Hypokalemia   3. Elevated serum creatinine       MDM  Windy Canny presents with fever and chills in a catheterized patient.  Urinalysis shows significant evidence of urinary tract infection with red blood cells many bacteria, white blood cells and leukocytes.  BMP with hyponatremia, hypokalemia, decreased chloride, increased BUN and creatinine.  CBC with leukocytosis of 10.8 and left shift.  Chest x-ray without acute disease.  Patient febrile and hypotensive and 96/54.  Patient meets SIRS criteria  with source of infection giving rise to concern for sepsis.  Will correct electrolyte imbalances including hypokalemia, give fluid bolus, begin IV Rocephin and proceed with admission.  Discussed patient with Dr. Dierdre Highman and he is in agreement.       Dahlia Client Serenna Deroy, PA-C 09/08/11 [phone]

## 2011-09-07 NOTE — ED Notes (Signed)
August 14, prostate closed off urethra, went to Dr. Earlene Plater urologist and had bladder drained and indwelling catheter, went back one week later for urodynamic test,  Indwelling cath taken out and given In and outs to take home, on Friday unable to get I&O caths in, went back to clinic and they put in another indwelling catheter, on Saturday began having chills associated with shaking and SOB.  Today fever of 103 and shaking, spoke with Dr. Lucretia Roers at [facility] urology and sent here. Pt c/o of fever and bilateral flank pain. Drainage bag for catheter with dark tea colored urine.

## 2011-09-08 ENCOUNTER — Inpatient Hospital Stay (HOSPITAL_COMMUNITY): Payer: BC Managed Care – PPO

## 2011-09-08 ENCOUNTER — Encounter (HOSPITAL_COMMUNITY): Payer: Self-pay | Admitting: Internal Medicine

## 2011-09-08 DIAGNOSIS — R509 Fever, unspecified: Secondary | ICD-10-CM

## 2011-09-08 DIAGNOSIS — E876 Hypokalemia: Secondary | ICD-10-CM | POA: Diagnosis present

## 2011-09-08 DIAGNOSIS — I959 Hypotension, unspecified: Secondary | ICD-10-CM

## 2011-09-08 DIAGNOSIS — E860 Dehydration: Secondary | ICD-10-CM | POA: Diagnosis present

## 2011-09-08 DIAGNOSIS — I2510 Atherosclerotic heart disease of native coronary artery without angina pectoris: Secondary | ICD-10-CM

## 2011-09-08 DIAGNOSIS — D649 Anemia, unspecified: Secondary | ICD-10-CM

## 2011-09-08 DIAGNOSIS — D72829 Elevated white blood cell count, unspecified: Secondary | ICD-10-CM | POA: Diagnosis present

## 2011-09-08 DIAGNOSIS — N390 Urinary tract infection, site not specified: Principal | ICD-10-CM

## 2011-09-08 LAB — COMPREHENSIVE METABOLIC PANEL
AST: 30 U/L (ref 0–37)
BUN: 26 mg/dL — ABNORMAL HIGH (ref 6–23)
CO2: 26 mEq/L (ref 19–32)
Calcium: 7.9 mg/dL — ABNORMAL LOW (ref 8.4–10.5)
Chloride: 100 mEq/L (ref 96–112)
Creatinine, Ser: 1.21 mg/dL (ref 0.50–1.35)
GFR calc Af Amer: 70 mL/min — ABNORMAL LOW (ref >90)
GFR calc non Af Amer: 61 mL/min — ABNORMAL LOW (ref >90)
Glucose, Bld: 115 mg/dL — ABNORMAL HIGH (ref 70–99)
Total Bilirubin: 0.5 mg/dL (ref 0.3–1.2)

## 2011-09-08 LAB — CBC WITH DIFFERENTIAL/PLATELET
Basophils Absolute: 0 10*3/uL (ref 0.0–0.1)
Eosinophils Relative: 2 % (ref 0–5)
Lymphocytes Relative: 9 % — ABNORMAL LOW (ref 12–46)
Lymphs Abs: 1 10*3/uL (ref 0.7–4.0)
MCV: 84.1 fL (ref 78.0–100.0)
Neutrophils Relative %: 81 % — ABNORMAL HIGH (ref 43–77)
Platelets: 155 10*3/uL (ref 150–400)
RBC: 3.89 MIL/uL — ABNORMAL LOW (ref 4.22–5.81)
RDW: 12.7 % (ref 11.5–15.5)
WBC: 10.1 10*3/uL (ref 4.0–10.5)

## 2011-09-08 LAB — MAGNESIUM: Magnesium: 2.2 mg/dL (ref 1.5–2.5)

## 2011-09-08 LAB — MRSA PCR SCREENING: MRSA by PCR: NEGATIVE

## 2011-09-08 LAB — LACTIC ACID, PLASMA: Lactic Acid, Venous: 1.3 mmol/L (ref 0.5–2.2)

## 2011-09-08 MED ORDER — ZOLPIDEM TARTRATE 5 MG PO TABS: 10.0000 mg | ORAL_TABLET | Freq: Every evening | ORAL | Status: DC | PRN

## 2011-09-08 MED ORDER — SODIUM CHLORIDE 0.9 % IV SOLN
INTRAVENOUS | Status: DC
  Administered 2011-09-08: 150 mL/h via INTRAVENOUS

## 2011-09-08 MED ORDER — POTASSIUM CHLORIDE CRYS ER 20 MEQ PO TBCR
40.0000 meq | EXTENDED_RELEASE_TABLET | Freq: Once | ORAL | Status: AC
  Administered 2011-09-08: 40 meq via ORAL
  Filled 2011-09-08: qty 2

## 2011-09-08 MED ORDER — ONDANSETRON HCL 4 MG/2ML IJ SOLN: 4.0000 mg | Freq: Four times a day (QID) | INTRAMUSCULAR | Status: DC | PRN

## 2011-09-08 MED ORDER — ONDANSETRON HCL 4 MG PO TABS: 4.0000 mg | ORAL_TABLET | Freq: Four times a day (QID) | ORAL | Status: DC | PRN

## 2011-09-08 MED ORDER — OMEGA-3-ACID ETHYL ESTERS 1 G PO CAPS
1.0000 g | ORAL_CAPSULE | Freq: Every day | ORAL | Status: DC
  Administered 2011-09-08 – 2011-09-09 (×2): 1 g via ORAL
  Filled 2011-09-08 (×4): qty 1

## 2011-09-08 MED ORDER — ACETAMINOPHEN 325 MG PO TABS: 650.0000 mg | ORAL_TABLET | Freq: Four times a day (QID) | ORAL | Status: DC | PRN

## 2011-09-08 MED ORDER — ACETAMINOPHEN 650 MG RE SUPP: 650.0000 mg | Freq: Four times a day (QID) | RECTAL | Status: DC | PRN

## 2011-09-08 MED ORDER — ZOLPIDEM TARTRATE 5 MG PO TABS
5.0000 mg | ORAL_TABLET | Freq: Every evening | ORAL | Status: DC | PRN
  Filled 2011-09-08: qty 1

## 2011-09-08 MED ORDER — SODIUM CHLORIDE 0.9 % IV BOLUS (SEPSIS)
1000.0000 mL | Freq: Once | INTRAVENOUS | Status: AC
  Administered 2011-09-08: 1000 mL via INTRAVENOUS

## 2011-09-08 MED ORDER — SODIUM CHLORIDE 0.9 % IV BOLUS (SEPSIS)
500.0000 mL | Freq: Once | INTRAVENOUS | Status: AC
  Administered 2011-09-08: 500 mL via INTRAVENOUS

## 2011-09-08 MED ORDER — SODIUM CHLORIDE 0.9 % IV SOLN
500.0000 mg | Freq: Three times a day (TID) | INTRAVENOUS | Status: DC
  Administered 2011-09-08 – 2011-09-09 (×5): 500 mg via INTRAVENOUS
  Filled 2011-09-08 (×6): qty 500

## 2011-09-08 MED ORDER — ASPIRIN EC 81 MG PO TBEC
81.0000 mg | DELAYED_RELEASE_TABLET | Freq: Every day | ORAL | Status: DC
  Administered 2011-09-08 – 2011-09-09 (×2): 81 mg via ORAL
  Filled 2011-09-08 (×3): qty 1

## 2011-09-08 MED ORDER — POTASSIUM CHLORIDE IN NACL 20-0.9 MEQ/L-% IV SOLN
INTRAVENOUS | Status: DC
  Administered 2011-09-08 – 2011-09-09 (×2): 125 mL/h via INTRAVENOUS
  Filled 2011-09-08 (×8): qty 1000

## 2011-09-08 MED ORDER — SODIUM CHLORIDE 0.9 % IJ SOLN
3.0000 mL | Freq: Two times a day (BID) | INTRAMUSCULAR | Status: DC
  Administered 2011-09-08 – 2011-09-09 (×4): 3 mL via INTRAVENOUS

## 2011-09-08 MED ORDER — POTASSIUM CHLORIDE 10 MEQ/100ML IV SOLN
10.0000 meq | INTRAVENOUS | Status: AC
  Administered 2011-09-08 (×2): 10 meq via INTRAVENOUS
  Filled 2011-09-08: qty 200

## 2011-09-08 MED ORDER — OXYCODONE-ACETAMINOPHEN 5-325 MG PO TABS
1.0000 | ORAL_TABLET | Freq: Four times a day (QID) | ORAL | Status: DC | PRN
  Administered 2011-09-09: 1 via ORAL
  Filled 2011-09-08: qty 1

## 2011-09-08 NOTE — Care Management Note (Signed)
    Page 1 of 1   09/08/2011     9:06:59 AM   CARE MANAGEMENT NOTE 09/08/2011  Patient:  Joel Payne, Joel Payne   Account Number:  [id number]  Date Initiated:  09/08/2011  Documentation initiated by:  Junius Creamer  Subjective/Objective Assessment:   adm w fever     Action/Plan:   lives w family, pcp dr Lesia Hausen   Anticipated DC Date:     Anticipated DC Plan:        DC Planning Services  CM consult      Choice offered to / List presented to:             Status of service:   Medicare Important Message given?   (If response is "NO", the following Medicare IM given date fields will be blank) Date Medicare IM given:   Date Additional Medicare IM given:    Discharge Disposition:    Per UR Regulation:  Reviewed for med. necessity/level of care/duration of stay  If discussed at Long Length of Stay Meetings, dates discussed:    Comments:  8/27 9:06p debbie Quiera Diffee rn,bsn [phone]

## 2011-09-08 NOTE — ED Provider Notes (Signed)
Medical screening examination/treatment/procedure(s) were performed by non-physician practitioner and as supervising physician I was immediately available for consultation/collaboration.  Juliet Rude. Rubin Payor, MD 09/08/11 [phone]

## 2011-09-08 NOTE — Progress Notes (Signed)
[facility] Progress Note [place] TEAM 1 - Stepdown/ICU TEAM   Joel Payne NWG:[id number] DOB: Dec 09, 1945 DOA: 09/07/2011 PCP: [name], MD  Brief narrative: 66 year old male patient with chronic Foley catheter placement beginning 08/26/2011. Came to the emergency department because of fever and chills onset 3 days prior. Apparently the patient's wife noticed he was becoming confused and not acting right. In the emergency department the patient was found to be mildly hypotensive and his urinalysis was consistent with a urinary tract infection. Patient also had fever greater than 102F.  Assessment/Plan: Principal Problem:  *UTI  *Continue empiric Primaxin *Suspect mediated by recent urinary retention requiring Foley catheterization *Followup on blood and urine cultures  Active Problems:  Hypotension/Dehydration *Continue IV fluid hydration since blood pressures remained soft *Urine output is good *Check orthostatic vital signs   CORONARY ARTERY DISEASE *Currently quiescent without any complaints of chest pain or shortness of breath *Continue aspirin *Beta blocker on hold secondary to low blood pressure   Fever/ Leukocytosis *Suspect secondary to urinary tract infection-the above   Hypokalemia *Replete and follow electrolyte panel   HYPERTENSION *Home medication Tenoretic on hold secondary to low blood pressure   BENIGN PROSTATIC HYPERTROPHY- with elevated PSA- preadmit foley *Patient endorses 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 Earlene Plater at [facility] and a followup appointment has been scheduled for this coming Friday, 09/11/2011 to discuss possible TURP procedure *Flomax on hold due to to suboptimal blood pressure   Anemia *Hemoglobin  stable-baseline unknown   DVT prophylaxis: SCDs Code Status: Full Family Communication: Directly with patient at the bedside Disposition Plan: Transfer to medical floor  Consultants: None  Procedures: None  Antibiotics: Primaxin 8/26 >>>  HPI/Subjective: Patient is alert and animated this morning. Denies back pain or abdominal pain. States does not feel as weak as previous. Outlined his past medical urological history as above. Tolerating solid diet   Objective: Blood pressure 107/57, pulse 66, temperature 98.1 F (36.7 C), temperature source Oral, resp. rate 19, height 5\' 8"  (1.727 m), weight 94 kg (207 lb 3.7 oz), SpO2 87.00%.  Intake/Output Summary (Last 24 hours) at 09/08/11 1423 Last data filed at 09/08/11 1300  Gross per 24 hour  Intake 3132.08 ml  Output   2125 ml  Net 1007.08 ml     Exam: General: No acute respiratory distress Lungs: Clear to auscultation bilaterally without wheezes or crackles Cardiovascular: Regular rate and rhythm without murmur gallop or rub normal S1 and S2, IV fluid at 125 cc per hour Abdomen: Nontender, nondistended, soft, bowel sounds positive, no rebound, no ascites, no appreciable mass Musculoskeletal: No significant cyanosis, clubbing of extremities Neurological: He is alert and oriented x3, exam non-focal  Data Reviewed: Basic Metabolic Panel:  Lab 09/08/11 5784 09/07/11 2148  NA 136 132*  K 3.2* 2.6*  CL 100 93*  CO2 26 26  GLUCOSE 115* 111*  BUN 26* 27*  CREATININE 1.21 1.46*  CALCIUM 7.9* 9.3  MG 2.2 --  PHOS -- --   Liver Function Tests:  Lab 09/08/11 0520  AST 30  ALT 25  ALKPHOS 57  BILITOT 0.5  PROT 6.0  ALBUMIN 2.8*   No results found for this basename: LIPASE:5,AMYLASE:5 in the last 168 hours No results found for this basename: AMMONIA:5 in the last 168 hours CBC:  Lab 09/08/11 0520 09/07/11 2148  WBC 10.1 10.8*  NEUTROABS 8.2* 10.2*  HGB 11.5* 13.6  HCT 32.7* 37.2*  MCV 84.1 82.9  PLT 155 166     Cardiac Enzymes: No results found for this basename: CKTOTAL:5,CKMB:5,CKMBINDEX:5,TROPONINI:5 in the last 168 hours BNP (last 3 results) No results found for this basename: PROBNP:3 in the last 8760 hours CBG: No results found for this basename: GLUCAP:5 in the last 168 hours  Recent Results (from the past 240 hour(s))  MRSA PCR SCREENING     Status: Normal   Collection Time   09/08/11  2:26 AM      Component Value Range Status Comment   MRSA by PCR NEGATIVE  NEGATIVE Final      Studies:  Recent x-ray studies have been reviewed in detail by the Attending Physician  Scheduled Meds:  Reviewed in detail by the Attending Physician   Junious Silk, ANP [facility] Office  [phone] Pager [phone]  On-Call/Text Page:      [email or web address]      password [email or web address]  If 7PM-7AM, please contact night-coverage [email or web address] Password [email or web address] 09/08/2011, 2:23 PM   LOS: 1 day   I have examined the patient and reviewed the chart. I agree with the above note which I have modified.   Calvert Cantor, MD [phone]

## 2011-09-08 NOTE — Progress Notes (Signed)
ANTIBIOTIC CONSULT NOTE - INITIAL  Pharmacy Consult for primaxin Indication: complicated UTI  Allergies  Allergen Reactions  . Simvastatin Other (See Comments)    Leg cramps    Patient Measurements: Height: 5\' 8"  (172.7 cm) Weight: 207 lb 14.3 oz (94.3 kg) IBW/kg (Calculated) : 68.4  Adjusted Body Weight:   Vital Signs: Temp: 97.9 F (36.6 C) (08/27 0209) Temp src: Oral (08/27 0209) BP: 102/55 mmHg (08/27 0209) Pulse Rate: 58  (08/27 0209) Intake/Output from previous day: 08/26 0701 - 08/27 0700 In: 1000 [I.V.:1000] Out: 250 [Urine:250] Intake/Output from this shift: Total I/O In: 1000 [I.V.:1000] Out: 250 [Urine:250]  Labs:  [facility] 09/07/11 2148  WBC 10.8*  HGB 13.6  PLT 166  LABCREA --  CREATININE 1.46*   Estimated Creatinine Clearance: 55.5 ml/min (by C-G formula based on Cr of 1.46). No results found for this basename: VANCOTROUGH:2,VANCOPEAK:2,VANCORANDOM:2,GENTTROUGH:2,GENTPEAK:2,GENTRANDOM:2,TOBRATROUGH:2,TOBRAPEAK:2,TOBRARND:2,AMIKACINPEAK:2,AMIKACINTROU:2,AMIKACIN:2, in the last 72 hours   Microbiology: No results found for this or any previous visit (from the past 720 hour(s)).  Medical History: Past Medical History  Diagnosis Date  . ALLERGIC RHINITIS 11/08/2006  . BENIGN PROSTATIC HYPERTROPHY 10/12/2006  . CORONARY ARTERY DISEASE 10/12/2006    Catheterization was normal 2012, mild nonobstructive coronary disease, normal LV function  . HYPERLIPIDEMIA 11/08/2006  . HYPERTENSION 10/12/2006  . NEPHROLITHIASIS, HX OF 10/12/2006  . Urinary frequency 10/18/2009  . PPD positive   . Shortness of breath     April, 2012  /  catheterization May, 2012 mild nonobstructive coronary disease  . Kidney stone     Medications:  Prescriptions prior to admission  Medication Sig Dispense Refill  . aspirin EC 81 MG tablet Take 81 mg by mouth daily.      Marland Kitchen atenolol-chlorthalidone (TENORETIC) 50-25 MG per tablet Take 1 tablet by mouth daily.  90 tablet  6  .  omega-3 acid ethyl esters (LOVAZA) 1 G capsule Take 1 g by mouth daily.      . Tamsulosin HCl (FLOMAX) 0.4 MG CAPS Take 0.4 mg by mouth daily after supper.       Assessment: 66 yo man admitted with fever, recent dx obstructive uropathy with enlarged prostate. He had catheter placed 4 days ago at [facility].  He came to ED with fever, chills and confusion.  Goal of Therapy:  Eradication of infection   Plan:  Primaxin 500 mg IV q8 hours. F/u cultures, renal function and clinical course.  Joel Payne 09/08/2011,2:11 AM

## 2011-09-08 NOTE — ED Notes (Signed)
Pt hypotensive but asympomatic - no diaphoresis, dizziness. Pt ao x 4.  Denies pain at this time.  Wife at bedside.  Urine output in foley (approx 250).

## 2011-09-08 NOTE — H&P (Signed)
Joel Payne is an 66 y.o. male. Patient was seen and examined on September 08, 2011 at 12:30 AM. PCP - Dr. Madison Hickman. Chief Complaint:  Fever. HPI:  66 year-old male who was recently diagnosed with obstructive uropathy was enlarged prostate was initially placed on Foley catheter by his urologist on August 14. Later last week patient was given catheter to do self catheter which was not possible. Since patient had urine retention as patient was not able to do the Self-Cath patient went to [facility] and had Foley catheter placed last Friday that is 4 days ago. The following Saturday is 3 days ago patient started developing fever and chills. Since the symptoms persisted and patient became will confused patient was instructed to go to the ER. In the ER today patient was found to be mildly hypotensive and UA was compatible with UTI and patient was febrile. Patient will be admitted for further IV antibiotics and hydration. Patient is denies any chest pain. Earlier he had mild shortness of breath which has resolved. Denies any abdominal pain. Patient's Foley catheter is draining clear fluid.  Past Medical History  Diagnosis Date  . ALLERGIC RHINITIS 11/08/2006  . BENIGN PROSTATIC HYPERTROPHY 10/12/2006  . CORONARY ARTERY DISEASE 10/12/2006    Catheterization was normal 2012, mild nonobstructive coronary disease, normal LV function  . HYPERLIPIDEMIA 11/08/2006  . HYPERTENSION 10/12/2006  . NEPHROLITHIASIS, HX OF 10/12/2006  . Urinary frequency 10/18/2009  . PPD positive   . Shortness of breath     April, 2012  /  catheterization May, 2012 mild nonobstructive coronary disease  . Kidney stone     Past Surgical History  Procedure Date  . Cardiac catheterization   . Prostate biopsy     History reviewed. No pertinent family history. Social History:  reports that he has never smoked. He has never used smokeless tobacco. He reports that he drinks alcohol. He reports that he does not use illicit  drugs.  Allergies:  Allergies  Allergen Reactions  . Simvastatin Other (See Comments)    Leg cramps     (Not in a hospital admission)  Results for orders placed during the hospital encounter of 09/07/11 (from the past 48 hour(s))  CBC WITH DIFFERENTIAL     Status: Abnormal   Collection Time   09/07/11  9:48 PM      Component Value Range Comment   WBC 10.8 (*) 4.0 - 10.5 K/uL    RBC 4.49  4.22 - 5.81 MIL/uL    Hemoglobin 13.6  13.0 - 17.0 g/dL    HCT 16.1 (*) 09.6 - 52.0 %    MCV 82.9  78.0 - 100.0 fL    MCH 30.3  26.0 - 34.0 pg    MCHC 36.6 (*) 30.0 - 36.0 g/dL    RDW 04.5  40.9 - 81.1 %    Platelets 166  150 - 400 K/uL    Neutrophils Relative 94 (*) 43 - 77 %    Neutro Abs 10.2 (*) 1.7 - 7.7 K/uL    Lymphocytes Relative 2 (*) 12 - 46 %    Lymphs Abs 0.3 (*) 0.7 - 4.0 K/uL    Monocytes Relative 3  3 - 12 %    Monocytes Absolute 0.3  0.1 - 1.0 K/uL    Eosinophils Relative 1  0 - 5 %    Eosinophils Absolute 0.1  0.0 - 0.7 K/uL    Basophils Relative 0  0 - 1 %    Basophils  Absolute 0.0  0.0 - 0.1 K/uL   BASIC METABOLIC PANEL     Status: Abnormal   Collection Time   09/07/11  9:48 PM      Component Value Range Comment   Sodium 132 (*) 135 - 145 mEq/L    Potassium 2.6 (*) 3.5 - 5.1 mEq/L    Chloride 93 (*) 96 - 112 mEq/L    CO2 26  19 - 32 mEq/L    Glucose, Bld 111 (*) 70 - 99 mg/dL    BUN 27 (*) 6 - 23 mg/dL    Creatinine, Ser 7.82 (*) 0.50 - 1.35 mg/dL    Calcium 9.3  8.4 - 95.6 mg/dL    GFR calc non Af Amer 48 (*) >90 mL/min    GFR calc Af Amer 56 (*) >90 mL/min   URINALYSIS, ROUTINE W REFLEX MICROSCOPIC     Status: Abnormal   Collection Time   09/07/11 10:08 PM      Component Value Range Comment   Color, Urine AMBER (*) YELLOW BIOCHEMICALS MAY BE AFFECTED BY COLOR   APPearance CLOUDY (*) CLEAR    Specific Gravity, Urine 1.020  1.005 - 1.030    pH 5.5  5.0 - 8.0    Glucose, UA NEGATIVE  NEGATIVE mg/dL    Hgb urine dipstick LARGE (*) NEGATIVE    Bilirubin Urine  NEGATIVE  NEGATIVE    Ketones, ur 15 (*) NEGATIVE mg/dL    Protein, ur 213 (*) NEGATIVE mg/dL    Urobilinogen, UA 1.0  0.0 - 1.0 mg/dL    Nitrite NEGATIVE  NEGATIVE    Leukocytes, UA MODERATE (*) NEGATIVE   URINE MICROSCOPIC-ADD ON     Status: Abnormal   Collection Time   09/07/11 10:08 PM      Component Value Range Comment   Squamous Epithelial / LPF RARE  RARE    WBC, UA 21-50  <3 WBC/hpf    RBC / HPF TOO NUMEROUS TO COUNT  <3 RBC/hpf    Bacteria, UA MANY (*) RARE    Urine-Other MUCOUS PRESENT   AMORPHOUS URATES/PHOSPHATES  LACTIC ACID, PLASMA     Status: Normal   Collection Time   09/07/11 11:15 PM      Component Value Range Comment   Lactic Acid, Venous 1.9  0.5 - 2.2 mmol/L    Dg Chest Portable 1 View  09/07/2011  *RADIOLOGY REPORT*  Clinical Data: Fever and cough.  PORTABLE CHEST - 1 VIEW  Comparison: None.  Findings: Lungs are clear.  Heart size is normal.  No pneumothorax or pleural fluid.  IMPRESSION: No acute disease.   Original Report Authenticated By: Bernadene Bell. Maricela Curet, M.D.     Review of Systems  Constitutional: Positive for fever.  HENT: Negative.   Eyes: Negative.   Respiratory: Negative.   Cardiovascular: Negative.   Gastrointestinal: Negative.   Genitourinary: Negative.   Musculoskeletal: Negative.   Skin: Negative.   Neurological: Negative.   Endo/Heme/Allergies: Negative.   Psychiatric/Behavioral: Negative.     Blood pressure 98/76, pulse 65, temperature 99.3 F (37.4 C), temperature source Oral, resp. rate 16, SpO2 100.00%. Physical Exam  Constitutional: He is oriented to person, place, and time. He appears well-developed and well-nourished. No distress.  HENT:  Head: Normocephalic and atraumatic.  Right Ear: External ear normal.  Left Ear: External ear normal.  Nose: Nose normal.  Mouth/Throat: Oropharynx is clear and moist. No oropharyngeal exudate.  Eyes: Conjunctivae are normal. Pupils are equal, round, and reactive to light.  Right eye  exhibits no discharge. Left eye exhibits no discharge. No scleral icterus.  Neck: Normal range of motion. Neck supple.  Cardiovascular: Normal rate and regular rhythm.   Respiratory: Effort normal and breath sounds normal. No respiratory distress. He has no wheezes. He has no rales.  GI: Soft. Bowel sounds are normal. He exhibits no distension. There is no tenderness. There is no rebound.  Musculoskeletal: Normal range of motion. He exhibits no edema and no tenderness.  Neurological: He is alert and oriented to person, place, and time.       Moves all extremities.  Skin: Skin is warm and dry. He is not diaphoretic. No erythema.  Psychiatric: His behavior is normal.     Assessment/Plan #1. Fever with hypotension probably from catheter-related UTI - patient was started on ceftriaxone. Urine cultures are been ordered. At this time we'll continue with Primaxin until we get cultures. Since patient is mildly hypotensive we will continue with IV hydration and hold off his antihypertensives. His lactic acid is in the normal level. #2. Hypokalemia probably related to his diuretics -  Replace and recheck. #3. Mild renal failure - probably related to his hypotension and dehydration and possibly mild obstruction which probably has been relieved by his Foley catheter. Continue to closely observe intake output and metabolic panel. We will check renal sonogram to make sure there is no hydronephrosis. #4. Hypertension - since patient is mildly hypotensive we will be holding off his antihypertensives. #5. History of nonobstructive CAD.  CODE STATUS - full code.  Gaspare Netzel N. 09/08/2011, 12:42 AM

## 2011-09-09 DIAGNOSIS — N400 Enlarged prostate without lower urinary tract symptoms: Secondary | ICD-10-CM

## 2011-09-09 LAB — BASIC METABOLIC PANEL
Chloride: 103 mEq/L (ref 96–112)
GFR calc Af Amer: >90 mL/min (ref >90)
GFR calc non Af Amer: 89 mL/min — ABNORMAL LOW (ref >90)
Potassium: 3.5 mEq/L (ref 3.5–5.1)
Sodium: 136 mEq/L (ref 135–145)

## 2011-09-09 LAB — URINE CULTURE

## 2011-09-09 LAB — CBC
Hemoglobin: 11.6 g/dL — ABNORMAL LOW (ref 13.0–17.0)
MCHC: 34.6 g/dL (ref 30.0–36.0)
RDW: 12.8 % (ref 11.5–15.5)
WBC: 6.9 10*3/uL (ref 4.0–10.5)

## 2011-09-09 MED ORDER — LEVOFLOXACIN IN D5W 500 MG/100ML IV SOLN
500.0000 mg | INTRAVENOUS | Status: DC
  Administered 2011-09-10: 500 mg via INTRAVENOUS
  Filled 2011-09-09 (×2): qty 100

## 2011-09-09 MED ORDER — LEVOFLOXACIN IN D5W 500 MG/100ML IV SOLN: 500.0000 mg | INTRAVENOUS | Status: DC

## 2011-09-09 MED ORDER — SODIUM CHLORIDE 0.9 % IV SOLN
500.0000 mg | Freq: Four times a day (QID) | INTRAVENOUS | Status: AC
  Administered 2011-09-09: 500 mg via INTRAVENOUS
  Filled 2011-09-09 (×3): qty 500

## 2011-09-09 MED ORDER — TAMSULOSIN HCL 0.4 MG PO CAPS
0.4000 mg | ORAL_CAPSULE | Freq: Every day | ORAL | Status: DC
  Administered 2011-09-09: 0.4 mg via ORAL
  Filled 2011-09-09 (×2): qty 1

## 2011-09-09 NOTE — Progress Notes (Signed)
[facility] PROGRESS NOTE  GAVEN EUGENE ZOX:[id number] DOB: December 26, 1945 DOA: 09/07/2011 PCP: [name], MD  Assessment/Plan: Principal Problem:  *UTI (lower urinary tract infection) Active Problems:  HYPERTENSION  BENIGN PROSTATIC HYPERTROPHY- with elevated PSA- preadmit foley  CORONARY ARTERY DISEASE  Fever  Hypotension  Hypokalemia  Dehydration  Anemia  Leukocytosis    1. UTI:  Patient presented with fever, altered mental status, against a background of recent catheterization for obstructive uropathy. He was placed on empiric Primaxin, now day# 2. Renal ultrasound, showed prostatomegaly, but  No hydronephrosis or calculi. Cultures have grown Acinetobacter, sensitive to the quinolones. Patient is now afebrile, wcc has normalized, and he feels better. We shall trasition to Levaquin monotherapy from 09/10/11.  2. Hypotension/Dehydration:   Patient was hypotensive at presentation. With elevated BUN/Creatinine ratio, consistent with dehydration and volume depletion. Preadmission antihypertensives were placed on hold, he was managed with aggressive iv fluid hydration, with resolution. BP is normal today, and urine output is good. Have discontinued iv fluids. 3. CORONARY ARTERY DISEASE:  Currently stable/asymptomatic, without any complaints of chest pain or shortness of breath. Continued on Aspirin. Beta blocker on hold secondary to low blood pressure at presentation.   4. Hypokalemia:  Repleted as indicated.  5. HYPERTENSION:  See # 2 above. Home medication Tenoretic on hold secondary to low blood pressure  6. 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 He is currently being followed by Dr Earlene Plater, urologist/Dr. Earlene Plater at [facility] and a  followup appointment has been scheduled for 09/11/2011 to discuss possible TURP procedure. Flomax was placed on hold, due to to suboptimal blood pressure. This will be reinstated today.   7. Anemia:   Hemoglobin stable. Baseline unknown      Code Status: Full Code.  Family Communication:  Disposition Plan: Aiming discharge on 09/10/11.    Brief narrative: 66 year-old male who was recently diagnosed with obstructive uropathy was enlarged prostate was initially placed on Foley catheter by his urologist on August 14. Later last week patient was given catheter to do self catheter which was not possible. Since patient had urine retention as patient was not able to do the Self-Cath patient went to [facility] and had Foley catheter placed on 08/25/11. Over the next 3 days, patient developed fever and chills. Since the symptoms persisted and patient became confused patient was instructed to go to the ER, where he was found to be mildly hypotensive, febrile and UA was compatible with UTI.   Consultants:  N/A.   Procedures:  CXR  Renal ultrasound.   Antibiotics:  Imipenem started 09/07/11.  Rocephin on 09/07/11, only.   HPI/Subjective: Feels much better.   Objective: Vital signs in last 24 hours: Temp:  [98 F (36.7 C)-99.5 F (37.5 C)] 98 F (36.7 C) (08/28 0515) Pulse Rate:  [74-81] 74  (08/28 0515) Resp:  [18] 18  (08/28 0515) BP: (101-123)/(58-73) 123/73 mmHg (08/28 0515) SpO2:  [95 %-98 %] 97 % (08/28 0515) Weight:  [97.115 kg (214 lb 1.6 oz)] 97.115 kg (214 lb 1.6 oz) (08/28 0515) Weight change: -0.3 kg (-10.6 oz) Last BM Date: 09/09/11  Intake/Output from previous day: 08/27 0701 - 08/28 0700 In: 3990 [P.O.:940; I.V.:2950; IV Piggyback:100] Out: 4025 [Urine:4025] Total I/O In: 240 [P.O.:240] Out: 400 [Urine:400]   Physical  Exam: General: Comfortable, alert, communicative, fully oriented, not short of breath at rest.  HEENT: Mild clinical pallor, no jaundice,  no conjunctival injection or discharge. Hydration status is fair.  NECK:  Supple, JVP not seen, no carotid bruits, no palpable lymphadenopathy, no palpable goiter. CHEST:  Clinically clear to auscultation, no wheezes, no crackles. HEART:  Sounds 1 and 2 heard, normal, regular, no murmurs. ABDOMEN:  Full, soft, non-tender, no palpable organomegaly, no palpable masses, normal bowel sounds. GENITALIA:  Not examined. LOWER EXTREMITIES:  No pitting edema, palpable peripheral pulses. MUSCULOSKELETAL SYSTEM:  Generalized osteoarthritic changes, otherwise, normal. CENTRAL NERVOUS SYSTEM:  No focal neurologic deficit on gross examination.  Lab Results:  Basename 09/09/11 0500 09/08/11 0520  WBC 6.9 10.1  HGB 11.6* 11.5*  HCT 33.5* 32.7*  PLT 163 155    Basename 09/09/11 0500 09/08/11 0520  NA 136 136  K 3.5 3.2*  CL 103 100  CO2 28 26  GLUCOSE 103* 115*  BUN 12 26*  CREATININE 0.85 1.21  CALCIUM 8.7 7.9*   Recent Results (from the past 240 hour(s))  URINE CULTURE     Status: Normal   Collection Time   09/07/11 10:08 PM      Component Value Range Status Comment   Specimen Description URINE, CATHETERIZED   Final    Special Requests ADDED 2341   Final    Culture  Setup Time 09/08/2011 05:18   Final    Colony Count >=100,000 COLONIES/ML   Final    Culture ACINETOBACTER CALCOACETICUS/BAUMANNII COMPLEX   Final    Report Status 09/09/2011 FINAL   Final    Organism ID, Bacteria ACINETOBACTER CALCOACETICUS/BAUMANNII COMPLEX   Final   MRSA PCR SCREENING     Status: Normal   Collection Time   09/08/11  2:26 AM      Component Value Range Status Comment   MRSA by PCR NEGATIVE  NEGATIVE Final   CULTURE, BLOOD (ROUTINE X 2)     Status: Normal (Preliminary result)   Collection Time   09/08/11  9:52 AM      Component Value Range Status Comment   Specimen Description BLOOD RIGHT ARM   Final    Special Requests BOTTLES DRAWN AEROBIC AND ANAEROBIC 10CC   Final    Culture  Setup Time 09/08/2011  14:32   Final    Culture     Final    Value:        BLOOD CULTURE RECEIVED NO GROWTH TO DATE CULTURE WILL BE HELD FOR 5 DAYS BEFORE ISSUING A FINAL NEGATIVE REPORT   Report Status PENDING   Incomplete   CULTURE, BLOOD (ROUTINE X 2)     Status: Normal (Preliminary result)   Collection Time   09/08/11 10:00 AM      Component Value Range Status Comment   Specimen Description BLOOD RIGHT HAND   Final    Special Requests BOTTLES DRAWN AEROBIC ONLY 10CC   Final    Culture  Setup Time 09/08/2011 14:32   Final    Culture     Final    Value:        BLOOD CULTURE RECEIVED NO GROWTH TO DATE CULTURE WILL BE HELD FOR 5 DAYS BEFORE ISSUING A FINAL NEGATIVE REPORT   Report Status PENDING   Incomplete      Studies/Results: US Renal Port  09/08/2011  *RADIOLOGY REPORT*  Clinical Data: Renal failure.  RENAL/URINARY TRACT ULTRASOUND COMPLETE  Comparison:  None.  Findings:  Right Kidney:  13.5  cm.  Multiple simple renal cysts are present. The largest cyst measures 19 mm x 19 mm x 19 mm in the upper pole. No hydronephrosis.  Left Kidney:  14.5 cm.  Multiple cysts are present which appears simple.  The largest cyst is in the interpolar region measuring 65 mm x 47 mm x 50 mm. No hydronephrosis.  Bladder:  Foley catheter is present with decompressed urinary bladder.  Prostatomegaly is present with prostate gland measuring 51 x 49 mm.  IMPRESSION: 1.  No hydronephrosis or calculi identified. 2.  Decompressed urinary bladder with Foley catheter. 3.  Prostatomegaly. 4.  Bilateral renal cysts, maximally measuring 19 mm on the right and 65 mm on the left.   Original Report Authenticated By: Andreas Newport, M.D.    Dg Chest Portable 1 View  09/07/2011  *RADIOLOGY REPORT*  Clinical Data: Fever and cough.  PORTABLE CHEST - 1 VIEW  Comparison: None.  Findings: Lungs are clear.  Heart size is normal.  No pneumothorax or pleural fluid.  IMPRESSION: No acute disease.   Original Report Authenticated By: Bernadene Bell. Maricela Curet, M.D.      Medications: Scheduled Meds:   . aspirin EC  81 mg Oral Daily  . imipenem-cilastatin  500 mg Intravenous Q6H  . omega-3 acid ethyl esters  1 g Oral Daily  . sodium chloride  3 mL Intravenous Q12H  . DISCONTD: imipenem-cilastatin  500 mg Intravenous Q8H   Continuous Infusions:   . 0.9 % NaCl with KCl 20 mEq / L 125 mL/hr (09/09/11 0636)   PRN Meds:.acetaminophen, acetaminophen, ondansetron (ZOFRAN) IV, ondansetron, oxyCODONE-acetaminophen, zolpidem, DISCONTD: zolpidem    LOS: 2 days   Loy Mccartt,CHRISTOPHER  [facility] Pager [phone]. If 8PM-8AM, please contact night-coverage at [email or web address], password [facility] 09/09/2011, 2:06 PM  LOS: 2 days

## 2011-09-09 NOTE — Progress Notes (Signed)
ANTIBIOTIC CONSULT NOTE - Follow-Up  Pharmacy Consult for primaxin Indication: complicated UTI  Allergies  Allergen Reactions  . Simvastatin Other (See Comments)    Leg cramps    Patient Measurements: Height: 5\' 8"  (172.7 cm) Weight: 214 lb 1.6 oz (97.115 kg) IBW/kg (Calculated) : 68.4   Vital Signs: Temp: 98 F (36.7 C) (08/28 0515) BP: 123/73 mmHg (08/28 0515) Pulse Rate: 74  (08/28 0515) Intake/Output from previous day: 08/27 0701 - 08/28 0700 In: 3990 [P.O.:940; I.V.:2950; IV Piggyback:100] Out: 4025 [Urine:4025] Intake/Output from this shift: Total I/O In: 240 [P.O.:240] Out: 400 [Urine:400]  Labs:  [facility] 09/09/11 0500 09/08/11 0520 09/07/11 2148  WBC 6.9 10.1 10.8*  HGB 11.6* 11.5* 13.6  PLT 163 155 166  LABCREA -- -- --  CREATININE 0.85 1.21 1.46*   Estimated Creatinine Clearance: 96.6 ml/min (by C-G formula based on Cr of 0.85). No results found for this basename: VANCOTROUGH:2,VANCOPEAK:2,VANCORANDOM:2,GENTTROUGH:2,GENTPEAK:2,GENTRANDOM:2,TOBRATROUGH:2,TOBRAPEAK:2,TOBRARND:2,AMIKACINPEAK:2,AMIKACINTROU:2,AMIKACIN:2, in the last 72 hours   Microbiology: Recent Results (from the past 720 hour(s))  URINE CULTURE     Status: Normal (Preliminary result)   Collection Time   09/07/11 10:08 PM      Component Value Range Status Comment   Specimen Description URINE, CATHETERIZED   Final    Special Requests ADDED 2341   Final    Culture  Setup Time 09/08/2011 05:18   Final    Colony Count >=100,000 COLONIES/ML   Final    Culture GRAM NEGATIVE RODS   Final    Report Status PENDING   Incomplete   MRSA PCR SCREENING     Status: Normal   Collection Time   09/08/11  2:26 AM      Component Value Range Status Comment   MRSA by PCR NEGATIVE  NEGATIVE Final   CULTURE, BLOOD (ROUTINE X 2)     Status: Normal (Preliminary result)   Collection Time   09/08/11  9:52 AM      Component Value Range Status Comment   Specimen Description BLOOD RIGHT ARM   Final    Special  Requests BOTTLES DRAWN AEROBIC AND ANAEROBIC 10CC   Final    Culture  Setup Time 09/08/2011 14:32   Final    Culture     Final    Value:        BLOOD CULTURE RECEIVED NO GROWTH TO DATE CULTURE WILL BE HELD FOR 5 DAYS BEFORE ISSUING A FINAL NEGATIVE REPORT   Report Status PENDING   Incomplete   CULTURE, BLOOD (ROUTINE X 2)     Status: Normal (Preliminary result)   Collection Time   09/08/11 10:00 AM      Component Value Range Status Comment   Specimen Description BLOOD RIGHT HAND   Final    Special Requests BOTTLES DRAWN AEROBIC ONLY 10CC   Final    Culture  Setup Time 09/08/2011 14:32   Final    Culture     Final    Value:        BLOOD CULTURE RECEIVED NO GROWTH TO DATE CULTURE WILL BE HELD FOR 5 DAYS BEFORE ISSUING A FINAL NEGATIVE REPORT   Report Status PENDING   Incomplete    Assessment: 66 yo man on Primaxin D#2 for GNR UTI. WBC back to wnl. Tmax=99.5. Noted improvement in Scr with CrCl now ~100 ml/min.  Primaxin 8/27>>  8/26 urine - GNR 8/27 Bld x 2  -ngtd  Goal of Therapy:  Eradication of infection  Plan:  1) Change Primaxin to 500 mg IV  q6 hours. 2) F/u cultures, renal function and clinical course.  Christoper Fabian, PharmD, BCPS Clinical pharmacist, pager [phone] 09/09/2011,11:36 AM

## 2011-09-09 NOTE — Discharge Summary (Signed)
Physician Discharge Summary  Joel Payne JXB:[id number] DOB: 04/26/1945 DOA: 09/07/2011  PCP: Joel Boga, MD  Admit date: 09/07/2011 Discharge date: 09/10/2011  Recommendations for Outpatient Follow-up:  1. Follow up with primary MD. 2. Follow up with primary urologist, Dr Joel Payne, at [facility].   Discharge Diagnoses:  Principal Problem:  *UTI (lower urinary tract infection) Active Problems:  HYPERTENSION  BENIGN PROSTATIC HYPERTROPHY- with elevated PSA- preadmit foley  CORONARY ARTERY DISEASE  Fever  Hypotension  Hypokalemia  Dehydration  Anemia  Leukocytosis   Discharge Condition: Satisfactory.   Diet recommendation: Heart-Healthy.  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 Later last week patient was given catheter to do self catheter which was not possible. Since patient had urine retention as patient was not able to do the Self-Cath patient went to [facility] and had Foley catheter placed on 08/25/11. Over the next 3 days, patient developed fever and chills. Since the symptoms persisted and patient became confused patient was instructed to go to the ER, where he was found to be mildly hypotensive, febrile and UA was compatible with UTI.    Hospital Course:  1. UTI:  Patient presented with fever, altered mental status, against a background of recent catheterization for obstructive uropathy. He was placed on empiric iv Primaxin. Renal ultrasound, showed prostatomegaly, but no hydronephrosis or calculi. Cultures have grown Acinetobacter Calcoaceticus/Baumannii complex, sensitive to the quinolones. Patient has defervesced, wcc has normalized, and as of 09/09/11, he feels  considerably better. He has been transitioned to Levaquin monotherapy from 09/10/11, and will complete a course of 10  Days antibiotic therapy, on 09/16/11.  2. Hypotension/Dehydration:  Patient was hypotensive at presentation, with elevated BUN/Creatinine ratio, consistent with dehydration and volume depletion. Pre-admission antihypertensives were placed on hold, he was managed with aggressive iv fluid hydration, with resolution. As of 09/09/11, BP was normal and urine output, good. IV fluids were discontinued on that date.  3. CORONARY ARTERY DISEASE:  Currently stable/asymptomatic, without any complaints of chest pain or shortness of breath. Continued on Aspirin. Beta blocker was placed on hold secondary to low blood pressure at presentation, but reinstated on discharge. Diuretic moiety. Has been discontinued.  4. Hypokalemia:  Repleted as indicated.  5. HYPERTENSION:  See # 2 above. Home medication Tenoretic was placed on hold, secondary to low blood pressure, however, as BP started creeping up on 09/10/11, patient has been commenced on Atenolol. 50 mg daily.  6. 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 He is currently being followed by Dr Joel Payne, urologist/Dr. Earlene Payne at [facility] and a followup appointment has been scheduled for 09/11/2011 to discuss possible TURP procedure. Flomax was initially placed on hold, due to to suboptimal blood pressure, but was reinstated on reinstated 09/09/11, without deleterious effect.  7. Anemia:  Hemoglobin stable. Baseline unknown       Procedures:  See below.   Consultations:  *N/A.  Discharge Exam: Filed Vitals:   09/10/11 0545  BP: 149/82  Pulse: 82  Temp:  98.6 F (37 C)  Resp: 18   Filed Vitals:   09/09/11 0515  09/09/11 1445 09/09/11 2150 09/10/11 0545  BP: 123/73 145/74 152/80 149/82  Pulse: 74 80 78 82  Temp: 98 F (36.7 C) 98 F (36.7 C) 97.9 F (36.6 C) 98.6 F (37 C)  TempSrc:  Oral Oral Oral  Resp: 18 18 18 18   Height:      Weight: 97.115 kg (214 lb 1.6 oz)   96.163 kg (212 lb)  SpO2: 97% 98% 98% 96%    General: Comfortable, alert, communicative, fully oriented, not short of breath at rest.  HEENT: Mild clinical pallor, no jaundice, no conjunctival injection or discharge. Hydration status is fair.  NECK: Supple, JVP not seen, no carotid bruits, no palpable lymphadenopathy, no palpable goiter.  CHEST: Clinically clear to auscultation, no wheezes, no crackles.  HEART: Sounds 1 and 2 heard, normal, regular, no murmurs.  ABDOMEN: Full, soft, non-tender, no palpable organomegaly, no palpable masses, normal bowel sounds.  GENITALIA: Not examined.  LOWER EXTREMITIES: No pitting edema, palpable peripheral pulses.  MUSCULOSKELETAL SYSTEM: Generalized osteoarthritic changes, otherwise, normal.  CENTRAL NERVOUS SYSTEM: No focal neurologic deficit on gross examination.  Discharge Instructions  Discharge Orders    Future Orders Please Complete By Expires   Diet - low sodium heart healthy      Increase activity slowly        Medication List  As of 09/10/2011 12:44 PM   STOP taking these medications         atenolol-chlorthalidone 50-25 MG per tablet         TAKE these medications         aspirin EC 81 MG tablet   Take 81 mg by mouth daily.      atenolol 50 MG tablet   Commonly known as: TENORMIN   Take 1 tablet (50 mg total) by mouth daily.      levofloxacin 500 MG tablet   Commonly known as: LEVAQUIN   Take 1 tablet (500 mg total) by mouth daily.      omega-3 acid ethyl esters 1 G capsule   Commonly known as: LOVAZA   Take 1 g by mouth daily.      oxyCODONE-acetaminophen 5-325 MG per tablet   Commonly known as: PERCOCET/ROXICET   Take 1 tablet by mouth every 6 (six) hours  as needed.      Tamsulosin HCl 0.4 MG Caps   Commonly known as: FLOMAX   Take 0.4 mg by mouth daily after supper.           Follow-up Information    Schedule an appointment as soon as possible for a visit with Joel Boga, MD.   Contact information:   [address] [place] [address] [phone]       Follow up with DAVIS III, RONALD L, MD. (Keep your scheduled appointment for 09/11/11. )    Contact information:   [address] [facility] [place] [address] [phone]           The results of significant diagnostics from this hospitalization (including imaging, microbiology, ancillary and laboratory) are listed below for reference.    Significant Diagnostic Studies: US Renal Port  09/08/2011  *RADIOLOGY REPORT*  Clinical Data: Renal failure.  RENAL/URINARY TRACT ULTRASOUND COMPLETE  Comparison:  None.  Findings:  Right Kidney:  13.5 cm.  Multiple simple renal cysts are present. The largest cyst measures 19 mm x 19 mm x 19 mm in the  upper pole. No hydronephrosis.  Left Kidney:  14.5 cm.  Multiple cysts are present which appears simple.  The largest cyst is in the interpolar region measuring 65 mm x 47 mm x 50 mm. No hydronephrosis.  Bladder:  Foley catheter is present with decompressed urinary bladder.  Prostatomegaly is present with prostate gland measuring 51 x 49 mm.  IMPRESSION: 1.  No hydronephrosis or calculi identified. 2.  Decompressed urinary bladder with Foley catheter. 3.  Prostatomegaly. 4.  Bilateral renal cysts, maximally measuring 19 mm on the right and 65 mm on the left.   Original Report Authenticated By: Andreas Newport, M.D.    Dg Chest Portable 1 View  09/07/2011  *RADIOLOGY REPORT*  Clinical Data: Fever and cough.  PORTABLE CHEST - 1 VIEW  Comparison: None.  Findings: Lungs are clear.  Heart size is normal.  No pneumothorax or pleural fluid.  IMPRESSION: No acute disease.   Original Report Authenticated By: Bernadene Bell. Maricela Curet, M.D.     Microbiology: Recent Results (from the past 240 hour(s))  URINE CULTURE     Status: Normal   Collection Time   09/07/11 10:08 PM      Component Value Range Status Comment   Specimen Description URINE, CATHETERIZED   Final    Special Requests ADDED 2341   Final    Culture  Setup Time 09/08/2011 05:18   Final    Colony Count >=100,000 COLONIES/ML   Final    Culture ACINETOBACTER CALCOACETICUS/BAUMANNII COMPLEX   Final    Report Status 09/09/2011 FINAL   Final    Organism ID, Bacteria ACINETOBACTER CALCOACETICUS/BAUMANNII COMPLEX   Final   MRSA PCR SCREENING     Status: Normal   Collection Time   09/08/11  2:26 AM      Component Value Range Status Comment   MRSA by PCR NEGATIVE  NEGATIVE Final   CULTURE, BLOOD (ROUTINE X 2)     Status: Normal (Preliminary result)   Collection Time   09/08/11  9:52 AM      Component Value Range Status Comment   Specimen Description BLOOD RIGHT ARM   Final    Special Requests BOTTLES DRAWN AEROBIC AND ANAEROBIC 10CC   Final    Culture  Setup Time 09/08/2011 14:32   Final    Culture     Final    Value:        BLOOD CULTURE RECEIVED NO GROWTH TO DATE CULTURE WILL BE HELD FOR 5 DAYS BEFORE ISSUING A FINAL NEGATIVE REPORT   Report Status PENDING   Incomplete   CULTURE, BLOOD (ROUTINE X 2)     Status: Normal (Preliminary result)   Collection Time   09/08/11 10:00 AM      Component Value Range Status Comment   Specimen Description BLOOD RIGHT HAND   Final    Special Requests BOTTLES DRAWN AEROBIC ONLY 10CC   Final    Culture  Setup Time 09/08/2011 14:32   Final    Culture     Final    Value:        BLOOD CULTURE RECEIVED NO GROWTH TO DATE CULTURE WILL BE HELD FOR 5 DAYS BEFORE ISSUING A FINAL NEGATIVE REPORT   Report Status PENDING   Incomplete      Labs: Basic Metabolic Panel:  Lab 09/10/11 7253 09/09/11 0500 09/08/11 0520 09/07/11 2148  NA 136 136 136 132*  K 3.5 3.5 3.2* 2.6*  CL 99 103 100 93*  CO2 28 28 26  26  GLUCOSE  103* 103* 115* 111*  BUN 10 12 26* 27*  CREATININE 0.76 0.85 1.21 1.46*  CALCIUM 9.3 8.7 7.9* 9.3  MG -- -- 2.2 --  PHOS -- -- -- --   Liver Function Tests:  Lab 09/08/11 0520  AST 30  ALT 25  ALKPHOS 57  BILITOT 0.5  PROT 6.0  ALBUMIN 2.8*   No results found for this basename: LIPASE:5,AMYLASE:5 in the last 168 hours No results found for this basename: AMMONIA:5 in the last 168 hours CBC:  Lab 09/10/11 0632 09/09/11 0500 09/08/11 0520 09/07/11 2148  WBC 7.0 6.9 10.1 10.8*  NEUTROABS -- -- 8.2* 10.2*  HGB 12.4* 11.6* 11.5* 13.6  HCT 35.4* 33.5* 32.7* 37.2*  MCV 83.9 85.0 84.1 82.9  PLT 191 163 155 166   Cardiac Enzymes: No results found for this basename: CKTOTAL:5,CKMB:5,CKMBINDEX:5,TROPONINI:5 in the last 168 hours BNP: BNP (last 3 results) No results found for this basename: PROBNP:3 in the last 8760 hours CBG: No results found for this basename: GLUCAP:5 in the last 168 hours  Time coordinating discharge: 40 minutes  Signed:  Carmencita Cusic,CHRISTOPHER  [facility] 09/10/2011, 12:44 PM

## 2011-09-10 LAB — BASIC METABOLIC PANEL WITH GFR
BUN: 10 mg/dL (ref 6–23)
CO2: 28 meq/L (ref 19–32)
Calcium: 9.3 mg/dL (ref 8.4–10.5)
Chloride: 99 meq/L (ref 96–112)
Creatinine, Ser: 0.76 mg/dL (ref 0.50–1.35)
GFR calc Af Amer: >90 mL/min
GFR calc non Af Amer: >90 mL/min
Glucose, Bld: 103 mg/dL — ABNORMAL HIGH (ref 70–99)
Potassium: 3.5 meq/L (ref 3.5–5.1)
Sodium: 136 meq/L (ref 135–145)

## 2011-09-10 LAB — CBC
HCT: 35.4 % — ABNORMAL LOW (ref 39.0–52.0)
Hemoglobin: 12.4 g/dL — ABNORMAL LOW (ref 13.0–17.0)
MCH: 29.4 pg (ref 26.0–34.0)
MCHC: 35 g/dL (ref 30.0–36.0)
MCV: 83.9 fL (ref 78.0–100.0)
Platelets: 191 10*3/uL (ref 150–400)
RBC: 4.22 MIL/uL (ref 4.22–5.81)
RDW: 12.5 % (ref 11.5–15.5)
WBC: 7 10*3/uL (ref 4.0–10.5)

## 2011-09-10 MED ORDER — ATENOLOL 50 MG PO TABS: 50.0000 mg | ORAL_TABLET | Freq: Every day | ORAL | Status: DC

## 2011-09-10 MED ORDER — OXYCODONE-ACETAMINOPHEN 5-325 MG PO TABS: 1.0000 | ORAL_TABLET | Freq: Four times a day (QID) | ORAL | Status: AC | PRN

## 2011-09-10 MED ORDER — LEVOFLOXACIN 500 MG PO TABS: 500.0000 mg | ORAL_TABLET | Freq: Every day | ORAL | Status: AC

## 2011-09-11 DIAGNOSIS — N320 Bladder-neck obstruction: Secondary | ICD-10-CM | POA: Insufficient documentation

## 2011-09-12 ENCOUNTER — Other Ambulatory Visit: Payer: Self-pay | Admitting: Internal Medicine

## 2011-09-14 LAB — CULTURE, BLOOD (ROUTINE X 2): Culture: NO GROWTH

## 2011-10-01 ENCOUNTER — Ambulatory Visit (INDEPENDENT_AMBULATORY_CARE_PROVIDER_SITE_OTHER): Payer: BC Managed Care – PPO | Admitting: Internal Medicine

## 2011-10-01 ENCOUNTER — Encounter: Payer: Self-pay | Admitting: Internal Medicine

## 2011-10-01 VITALS — BP 110/70 | HR 62 | Temp 98.5°F | Resp 18 | Ht 67.5 in | Wt 203.0 lb

## 2011-10-01 DIAGNOSIS — N390 Urinary tract infection, site not specified: Secondary | ICD-10-CM

## 2011-10-01 DIAGNOSIS — Z23 Encounter for immunization: Secondary | ICD-10-CM

## 2011-10-01 DIAGNOSIS — I10 Essential (primary) hypertension: Secondary | ICD-10-CM

## 2011-10-01 DIAGNOSIS — I2510 Atherosclerotic heart disease of native coronary artery without angina pectoris: Secondary | ICD-10-CM

## 2011-10-01 MED ORDER — ATENOLOL 50 MG PO TABS: 50.0000 mg | ORAL_TABLET | Freq: Every day | ORAL | Status: DC

## 2011-10-01 NOTE — Progress Notes (Signed)
Subjective:    Patient ID: Joel Payne, male    DOB: Jul 31, 1945, 66 y.o.   MRN: [id number]  HPI  66 year old patient who is seen today for evaluation preoperatively for an elective robotic assisted prostatectomy at [facility]. He presented recently with acute urinary retention complicated by UTI fever hypotension and leukocytosis (SIRS). A Foley catheter remains in place. He is treated hypertension which has been well-controlled on atenolol. Chlorthalidone was discontinued during his recent hospital stay. He also remains on Flomax but has an indwelling Foley catheter. He has a history of minimal nonobstructive coronary artery disease and did have a heart catheterization in May of 2013. Remains quite active with walking and has no cardiopulmonary complaints or recent inpatient EKG reviewed Hospital records reviewed  Past Medical History  Diagnosis Date  . ALLERGIC RHINITIS 11/08/2006  . BENIGN PROSTATIC HYPERTROPHY 10/12/2006  . CORONARY ARTERY DISEASE 10/12/2006    Catheterization was normal 2012, mild nonobstructive coronary disease, normal LV function  . HYPERLIPIDEMIA 11/08/2006  . HYPERTENSION 10/12/2006  . NEPHROLITHIASIS, HX OF 10/12/2006  . Urinary frequency 10/18/2009  . PPD positive   . Shortness of breath     April, 2012  /  catheterization May, 2012 mild nonobstructive coronary disease  . Kidney stone     in 1980    History   Social History  . Marital Status: Single    Spouse Name: N/A    Number of Children: N/A  . Years of Education: N/A   Occupational History  . Not on file.   Social History Main Topics  . Smoking status: Never Smoker   . Smokeless tobacco: Never Used  . Alcohol Use: Yes  . Drug Use: No  . Sexually Active: Not on file   Other Topics Concern  . Not on file   Social History Narrative  . No narrative on file    Past Surgical History  Procedure Date  . Cardiac catheterization   . Prostate biopsy     No family history on  file.  Allergies  Allergen Reactions  . Simvastatin Other (See Comments)    Leg cramps    Current Outpatient Prescriptions on File Prior to Visit  Medication Sig Dispense Refill  . aspirin EC 81 MG tablet Take 81 mg by mouth daily.      Marland Kitchen omega-3 acid ethyl esters (LOVAZA) 1 G capsule Take 1 g by mouth daily.      . Tamsulosin HCl (FLOMAX) 0.4 MG CAPS Take 0.4 mg by mouth daily after supper.      Marland Kitchen DISCONTD: atenolol (TENORMIN) 50 MG tablet Take 1 tablet (50 mg total) by mouth daily.  30 tablet  0    BP 110/70  Pulse 62  Temp 98.5 F (36.9 C) (Oral)  Resp 18  Ht 5' 7.5" (1.715 m)  Wt 203 lb (92.08 kg)  BMI 31.32 kg/m2  SpO2 97%       Review of Systems  Constitutional: Negative for fever, chills, appetite change and fatigue.  HENT: Negative for hearing loss, ear pain, congestion, sore throat, trouble swallowing, neck stiffness, dental problem, voice change and tinnitus.   Eyes: Negative for pain, discharge and visual disturbance.  Respiratory: Negative for cough, chest tightness, wheezing and stridor.   Cardiovascular: Negative for chest pain, palpitations and leg swelling.  Gastrointestinal: Negative for nausea, vomiting, abdominal pain, diarrhea, constipation, blood in stool and abdominal distention.  Genitourinary: Positive for decreased urine volume and difficulty urinating. Negative for urgency, hematuria, flank pain,  discharge and genital sores.  Musculoskeletal: Negative for myalgias, back pain, joint swelling, arthralgias and gait problem.  Skin: Negative for rash.  Neurological: Negative for dizziness, syncope, speech difficulty, weakness, numbness and headaches.  Hematological: Negative for adenopathy. Does not bruise/bleed easily.  Psychiatric/Behavioral: Negative for behavioral problems and dysphoric mood. The patient is not nervous/anxious.        Objective:   Physical Exam  Constitutional: He is oriented to person, place, and time. He appears  well-developed.       Appears healthy in no acute distress. Blood pressure 110/70  HENT:  Head: Normocephalic.  Right Ear: External ear normal.  Left Ear: External ear normal.  Eyes: Conjunctivae normal and EOM are normal.  Neck: Normal range of motion.  Cardiovascular: Normal rate and normal heart sounds.   Pulmonary/Chest: Breath sounds normal.  Abdominal: Bowel sounds are normal.  Musculoskeletal: Normal range of motion. He exhibits no edema and no tenderness.  Neurological: He is alert and oriented to person, place, and time.  Psychiatric: He has a normal mood and affect. His behavior is normal.          Assessment & Plan:   BPH with obstructive uropathy and history of acute urinary retention. Presently with the indwelling Foley Hypertension well controlled. We'll continue on atenolol and discontinue chlorthalidone Minimal nonobstructive coronary artery disease. Remains asymptomatic. Good exercise tolerance. No preoperative cardiac evaluation indicated  Return here 6 months or when necessary

## 2011-10-01 NOTE — Patient Instructions (Addendum)
Limit your sodium (Salt) intake    It is important that you exercise regularly, at least 20 minutes 3 to 4 times per week.  If you develop chest pain or shortness of breath seek  medical attention.  Return in 6 months for follow-up  

## 2011-10-15 DIAGNOSIS — D62 Acute posthemorrhagic anemia: Secondary | ICD-10-CM | POA: Insufficient documentation

## 2012-03-25 ENCOUNTER — Ambulatory Visit (AMBULATORY_SURGERY_CENTER): Payer: BC Managed Care – PPO | Admitting: *Deleted

## 2012-03-25 ENCOUNTER — Encounter: Payer: Self-pay | Admitting: Gastroenterology

## 2012-03-25 VITALS — Ht 68.0 in | Wt 204.4 lb

## 2012-03-25 DIAGNOSIS — Z1211 Encounter for screening for malignant neoplasm of colon: Secondary | ICD-10-CM

## 2012-03-25 MED ORDER — NA SULFATE-K SULFATE-MG SULF 17.5-3.13-1.6 GM/177ML PO SOLN: ORAL | Status: DC

## 2012-03-30 ENCOUNTER — Ambulatory Visit: Payer: BC Managed Care – PPO | Admitting: Internal Medicine

## 2012-04-06 ENCOUNTER — Other Ambulatory Visit (INDEPENDENT_AMBULATORY_CARE_PROVIDER_SITE_OTHER): Payer: BC Managed Care – PPO

## 2012-04-06 ENCOUNTER — Ambulatory Visit: Payer: BC Managed Care – PPO | Admitting: Internal Medicine

## 2012-04-06 DIAGNOSIS — Z0000 Encounter for general adult medical examination without abnormal findings: Secondary | ICD-10-CM

## 2012-04-06 LAB — CBC WITH DIFFERENTIAL/PLATELET
Basophils Relative: 0.7 % (ref 0.0–3.0)
Eosinophils Relative: 8.7 % — ABNORMAL HIGH (ref 0.0–5.0)
Lymphocytes Relative: 26 % (ref 12.0–46.0)
Monocytes Relative: 10.2 % (ref 3.0–12.0)
Neutrophils Relative %: 54.4 % (ref 43.0–77.0)
Platelets: 194 10*3/uL (ref 150.0–400.0)
RBC: 5.31 Mil/uL (ref 4.22–5.81)
WBC: 5.8 10*3/uL (ref 4.5–10.5)

## 2012-04-06 LAB — LIPID PANEL
HDL: 27.8 mg/dL — ABNORMAL LOW (ref >39.00)
Triglycerides: 165 mg/dL — ABNORMAL HIGH (ref 0.0–149.0)
VLDL: 33 mg/dL (ref 0.0–40.0)

## 2012-04-06 LAB — HEPATIC FUNCTION PANEL
ALT: 23 U/L (ref 0–53)
AST: 20 U/L (ref 0–37)
Alkaline Phosphatase: 56 U/L (ref 39–117)
Bilirubin, Direct: 0.2 mg/dL (ref 0.0–0.3)
Total Bilirubin: 1.1 mg/dL (ref 0.3–1.2)
Total Protein: 6.8 g/dL (ref 6.0–8.3)

## 2012-04-06 LAB — POCT URINALYSIS DIPSTICK
Bilirubin, UA: NEGATIVE
Glucose, UA: NEGATIVE
Ketones, UA: NEGATIVE
Nitrite, UA: NEGATIVE
Spec Grav, UA: 1.02

## 2012-04-06 LAB — LDL CHOLESTEROL, DIRECT: Direct LDL: 157.5 mg/dL

## 2012-04-06 LAB — BASIC METABOLIC PANEL
BUN: 13 mg/dL (ref 6–23)
Chloride: 103 mEq/L (ref 96–112)
Creatinine, Ser: 0.8 mg/dL (ref 0.4–1.5)
GFR: 105.51 mL/min (ref >60.00)

## 2012-04-06 LAB — PSA: PSA: 0.53 ng/mL (ref 0.10–4.00)

## 2012-04-08 ENCOUNTER — Encounter: Payer: Self-pay | Admitting: Gastroenterology

## 2012-04-08 ENCOUNTER — Ambulatory Visit (AMBULATORY_SURGERY_CENTER): Payer: BC Managed Care – PPO | Admitting: Gastroenterology

## 2012-04-08 VITALS — BP 139/79 | HR 56 | Temp 98.3°F | Resp 18 | Ht 68.0 in | Wt 204.0 lb

## 2012-04-08 DIAGNOSIS — Z1211 Encounter for screening for malignant neoplasm of colon: Secondary | ICD-10-CM

## 2012-04-08 MED ORDER — SODIUM CHLORIDE 0.9 % IV SOLN: 500.0000 mL | INTRAVENOUS | Status: DC

## 2012-04-08 NOTE — Progress Notes (Signed)
Patient did not experience any of the following events: a burn prior to discharge; a fall within the facility; wrong site/side/patient/procedure/implant event; or a hospital transfer or hospital admission upon discharge from the facility. (G8907) Patient did not have preoperative order for IV antibiotic SSI prophylaxis. (G8918)  

## 2012-04-08 NOTE — Patient Instructions (Addendum)
Discharge instructions given with verbal understanding. Normal exam. Resume previous medications. YOU HAD AN ENDOSCOPIC PROCEDURE TODAY AT THE [facility]: Refer to the procedure report that was given to you for any specific questions about what was found during the examination.  If the procedure report does not answer your questions, please call your gastroenterologist to clarify.  If you requested that your care partner not be given the details of your procedure findings, then the procedure report has been included in a sealed envelope for you to review at your convenience later.  YOU SHOULD EXPECT: Some feelings of bloating in the abdomen. Passage of more gas than usual.  Walking can help get rid of the air that was put into your GI tract during the procedure and reduce the bloating. If you had a lower endoscopy (such as a colonoscopy or flexible sigmoidoscopy) you may notice spotting of blood in your stool or on the toilet paper. If you underwent a bowel prep for your procedure, then you may not have a normal bowel movement for a few days.  DIET: Your first meal following the procedure should be a light meal and then it is ok to progress to your normal diet.  A half-sandwich or bowl of soup is an example of a good first meal.  Heavy or fried foods are harder to digest and may make you feel nauseous or bloated.  Likewise meals heavy in dairy and vegetables can cause extra gas to form and this can also increase the bloating.  Drink plenty of fluids but you should avoid alcoholic beverages for 24 hours.  ACTIVITY: Your care partner should take you home directly after the procedure.  You should plan to take it easy, moving slowly for the rest of the day.  You can resume normal activity the day after the procedure however you should NOT DRIVE or use heavy machinery for 24 hours (because of the sedation medicines used during the test).    SYMPTOMS TO REPORT IMMEDIATELY: A gastroenterologist  can be reached at any hour.  During normal business hours, 8:30 AM to 5:00 PM Monday through Friday, call [phone].  After hours and on weekends, please call the GI answering service at [phone] who will take a message and have the physician on call contact you.   Following lower endoscopy (colonoscopy or flexible sigmoidoscopy):  Excessive amounts of blood in the stool  Significant tenderness or worsening of abdominal pains  Swelling of the abdomen that is new, acute  Fever of 100F or higher  FOLLOW UP: If any biopsies were taken you will be contacted by phone or by letter within the next 1-3 weeks.  Call your gastroenterologist if you have not heard about the biopsies in 3 weeks.  Our staff will call the home number listed on your records the next business day following your procedure to check on you and address any questions or concerns that you may have at that time regarding the information given to you following your procedure. This is a courtesy call and so if there is no answer at the home number and we have not heard from you through the emergency physician on call, we will assume that you have returned to your regular daily activities without incident.  SIGNATURES/CONFIDENTIALITY: You and/or your care partner have signed paperwork which will be entered into your electronic medical record.  These signatures attest to the fact that that the information above on your After Visit Summary has been reviewed   and is understood.  Full responsibility of the confidentiality of this discharge information lies with you and/or your care-partner. 

## 2012-04-08 NOTE — Op Note (Signed)
[facility] [address] [place], [address]   COLONOSCOPY PROCEDURE REPORT  PATIENT: Joel Payne, Joel Payne  MR#: [id number] BIRTHDATE: 12-12-1945 , 67  yrs. old GENDER: Male ENDOSCOPIST: Louis Meckel, MD REFERRED AO:ZHYQM Amador Cunas, M.D. PROCEDURE DATE:  04/08/2012 PROCEDURE:   Colonoscopy, diagnostic ASA CLASS: INDICATIONS:average risk screening. MEDICATIONS: MAC sedation, administered by CRNA and propofol (Diprivan) 300mg  IV  DESCRIPTION OF PROCEDURE:   After the risks benefits and alternatives of the procedure were thoroughly explained, informed consent was obtained.  A digital rectal exam revealed no abnormalities of the rectum.   The LB [id number]  endoscope was introduced through the anus and advanced to the cecum, which was identified by both the appendix and ileocecal valve. No adverse events experienced.   The quality of the prep was Suprep good  The instrument was then slowly withdrawn as the colon was fully examined.      COLON FINDINGS: A normal appearing cecum, ileocecal valve, and appendiceal orifice were identified.  The ascending, hepatic flexure, transverse, splenic flexure, descending, sigmoid colon and rectum appeared unremarkable.  No polyps or cancers were seen. Retroflexed views revealed no abnormalities. The time to cecum=3 minutes 10 seconds.  Withdrawal time=6 minutes 03 seconds.  The scope was withdrawn and the procedure completed. COMPLICATIONS: There were no complications.  ENDOSCOPIC IMPRESSION: Normal colon  RECOMMENDATIONS: Continue current colorectal screening recommendations for "routine risk" patients with a repeat colonoscopy in 10 years.   eSigned:  Louis Meckel, MD 04/08/2012 12:36 PM   cc:

## 2012-04-11 ENCOUNTER — Telehealth: Payer: Self-pay

## 2012-04-11 NOTE — Telephone Encounter (Signed)
Left message

## 2012-04-15 ENCOUNTER — Encounter: Payer: Self-pay | Admitting: Internal Medicine

## 2012-04-15 ENCOUNTER — Ambulatory Visit (INDEPENDENT_AMBULATORY_CARE_PROVIDER_SITE_OTHER): Payer: BC Managed Care – PPO | Admitting: Internal Medicine

## 2012-04-15 VITALS — BP 150/90 | HR 64 | Temp 98.1°F | Resp 18 | Ht 67.0 in | Wt 203.0 lb

## 2012-04-15 DIAGNOSIS — I10 Essential (primary) hypertension: Secondary | ICD-10-CM

## 2012-04-15 DIAGNOSIS — I2510 Atherosclerotic heart disease of native coronary artery without angina pectoris: Secondary | ICD-10-CM

## 2012-04-15 DIAGNOSIS — Z0000 Encounter for general adult medical examination without abnormal findings: Secondary | ICD-10-CM

## 2012-04-15 DIAGNOSIS — E785 Hyperlipidemia, unspecified: Secondary | ICD-10-CM

## 2012-04-15 DIAGNOSIS — R350 Frequency of micturition: Secondary | ICD-10-CM

## 2012-04-15 DIAGNOSIS — R972 Elevated prostate specific antigen [PSA]: Secondary | ICD-10-CM

## 2012-04-15 MED ORDER — ROSUVASTATIN CALCIUM 10 MG PO TABS: 10.0000 mg | ORAL_TABLET | Freq: Every day | ORAL | Status: DC

## 2012-04-15 MED ORDER — ATENOLOL 50 MG PO TABS: 50.0000 mg | ORAL_TABLET | Freq: Every day | ORAL | Status: DC

## 2012-04-15 NOTE — Progress Notes (Signed)
Subjective:    Patient ID: Joel Payne, male    DOB: Apr 28, 1945, 67 y.o.   MRN: [id number]  HPI 67 year old patient who is seen today for a wellness exam. Medical problems include stable coronary artery disease. He has fairly mild nonobstructive disease and remains asymptomatic. He has a history of dyslipidemia but has been intolerant of statins in the past. He has hypertension. He has a history of obstructive uropathy and is status post TURP in the fall. No voiding issues. He has history of nephrolithiasis which has been stable.  Past Medical History  Diagnosis Date  . ALLERGIC RHINITIS 11/08/2006  . BENIGN PROSTATIC HYPERTROPHY 10/12/2006  . CORONARY ARTERY DISEASE 10/12/2006    Catheterization was normal 2012, mild nonobstructive coronary disease, normal LV function  . HYPERLIPIDEMIA 11/08/2006  . HYPERTENSION 10/12/2006  . NEPHROLITHIASIS, HX OF 10/12/2006  . Urinary frequency 10/18/2009  . PPD positive   . Shortness of breath     April, 2012  /  catheterization May, 2012 mild nonobstructive coronary disease  . Kidney stone     in 1980    History   Social History  . Marital Status: Single    Spouse Name: N/A    Number of Children: N/A  . Years of Education: N/A   Occupational History  . Not on file.   Social History Main Topics  . Smoking status: Never Smoker   . Smokeless tobacco: Never Used  . Alcohol Use: 0.5 oz/week    1 drink(s) per week     Comment: occasionally  . Drug Use: No  . Sexually Active: Not on file   Other Topics Concern  . Not on file   Social History Narrative  . No narrative on file    Past Surgical History  Procedure Laterality Date  . Cardiac catheterization  2012  . Prostate biopsy  2008  . Non radical prostatectomy  08/2011    Family History  Problem Relation Age of Onset  . Colon cancer Neg Hx   . Esophageal cancer Neg Hx   . Rectal cancer Neg Hx   . Stomach cancer Neg Hx     Allergies  Allergen Reactions  . Simvastatin  Other (See Comments)    Leg cramps    Current Outpatient Prescriptions on File Prior to Visit  Medication Sig Dispense Refill  . aspirin EC 81 MG tablet Take 81 mg by mouth daily.      Marland Kitchen atenolol (TENORMIN) 50 MG tablet Take 1 tablet (50 mg total) by mouth daily.  90 tablet  5  . omega-3 acid ethyl esters (LOVAZA) 1 G capsule Take 1 g by mouth daily.       No current facility-administered medications on file prior to visit.    BP 150/90  Pulse 64  Temp(Src) 98.1 F (36.7 C) (Oral)  Resp 18  Ht 5\' 7"  (1.702 m)  Wt 203 lb (92.08 kg)  BMI 31.79 kg/m2  SpO2 97%       Review of Systems  Constitutional: Negative for fever, chills, activity change, appetite change and fatigue.  HENT: Negative for hearing loss, ear pain, congestion, rhinorrhea, sneezing, mouth sores, trouble swallowing, neck pain, neck stiffness, dental problem, voice change, sinus pressure and tinnitus.   Eyes: Negative for photophobia, pain, redness and visual disturbance.  Respiratory: Negative for apnea, cough, choking, chest tightness, shortness of breath and wheezing.   Cardiovascular: Negative for chest pain, palpitations and leg swelling.  Gastrointestinal: Negative for nausea, vomiting, abdominal pain,  diarrhea, constipation, blood in stool, abdominal distention, anal bleeding and rectal pain.  Genitourinary: Negative for dysuria, urgency, frequency, hematuria, flank pain, decreased urine volume, discharge, penile swelling, scrotal swelling, difficulty urinating, genital sores and testicular pain.  Musculoskeletal: Negative for myalgias, back pain, joint swelling, arthralgias and gait problem.  Skin: Negative for color change, rash and wound.  Neurological: Negative for dizziness, tremors, seizures, syncope, facial asymmetry, speech difficulty, weakness, light-headedness, numbness and headaches.  Hematological: Negative for adenopathy. Does not bruise/bleed easily.  Psychiatric/Behavioral: Negative for  suicidal ideas, hallucinations, behavioral problems, confusion, sleep disturbance, self-injury, dysphoric mood, decreased concentration and agitation. The patient is not nervous/anxious.        Objective:   Physical Exam  Constitutional: He appears well-developed and well-nourished.  HENT:  Head: Normocephalic and atraumatic.  Right Ear: External ear normal.  Left Ear: External ear normal.  Nose: Nose normal.  Mouth/Throat: Oropharynx is clear and moist.  Eyes: Conjunctivae and EOM are normal. Pupils are equal, round, and reactive to light. No scleral icterus.  Neck: Normal range of motion. Neck supple. No JVD present. No thyromegaly present.  Cardiovascular: Regular rhythm, normal heart sounds and intact distal pulses.  Exam reveals no gallop and no friction rub.   No murmur heard. Pulmonary/Chest: Effort normal and breath sounds normal. He exhibits no tenderness.  Abdominal: Soft. Bowel sounds are normal. He exhibits no distension and no mass. There is no tenderness.  Genitourinary: Penis normal.  Musculoskeletal: Normal range of motion. He exhibits no edema and no tenderness.  Lymphadenopathy:    He has no cervical adenopathy.  Neurological: He is alert. He has normal reflexes. No cranial nerve deficit. Coordination normal.  Skin: Skin is warm and dry. No rash noted.  Psychiatric: He has a normal mood and affect. His behavior is normal.          Assessment & Plan:   Preventive health examination. Patient has had a recent colonoscopy Hypertension stable Dyslipidemia. We'll give a call if twice weekly Crestor. Recheck in 6 months Status post non-radical prostatectomy for BPH and urinary retention stable  Medicines refilled Sampling of Crestor dispensed

## 2012-04-15 NOTE — Patient Instructions (Signed)
Limit your sodium (Salt) intake    It is important that you exercise regularly, at least 20 minutes 3 to 4 times per week.  If you develop chest pain or shortness of breath seek  medical attention.  Please check your blood pressure on a regular basis.  If it is consistently greater than 150/90, please make an office appointment.  

## 2012-12-26 ENCOUNTER — Ambulatory Visit (INDEPENDENT_AMBULATORY_CARE_PROVIDER_SITE_OTHER): Payer: BC Managed Care – PPO | Admitting: *Deleted

## 2012-12-26 DIAGNOSIS — Z2911 Encounter for prophylactic immunotherapy for respiratory syncytial virus (RSV): Secondary | ICD-10-CM

## 2012-12-26 DIAGNOSIS — Z23 Encounter for immunization: Secondary | ICD-10-CM

## 2013-03-27 ENCOUNTER — Telehealth: Payer: Self-pay | Admitting: Internal Medicine

## 2013-03-27 NOTE — Telephone Encounter (Signed)
Spoke to pt told him needs to been seen per Dr. Kirtland BouchardK. Pt verbalized understanding and transferred to scheduling to schedule physical.

## 2013-03-27 NOTE — Telephone Encounter (Signed)
Please advise 

## 2013-03-27 NOTE — Telephone Encounter (Signed)
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 . Pt would like to know if he needs to come in or can you do from his records?

## 2013-03-27 NOTE — Telephone Encounter (Signed)
Needs rov 

## 2013-04-06 ENCOUNTER — Other Ambulatory Visit (INDEPENDENT_AMBULATORY_CARE_PROVIDER_SITE_OTHER): Payer: BC Managed Care – PPO

## 2013-04-06 DIAGNOSIS — Z0000 Encounter for general adult medical examination without abnormal findings: Secondary | ICD-10-CM

## 2013-04-06 LAB — BASIC METABOLIC PANEL
BUN: 13 mg/dL (ref 6–23)
CHLORIDE: 104 meq/L (ref 96–112)
CO2: 29 mEq/L (ref 19–32)
CREATININE: 0.9 mg/dL (ref 0.4–1.5)
Calcium: 9.1 mg/dL (ref 8.4–10.5)
GFR: 91.53 mL/min (ref >60.00)
GLUCOSE: 95 mg/dL (ref 70–99)
POTASSIUM: 3.7 meq/L (ref 3.5–5.1)
Sodium: 140 mEq/L (ref 135–145)

## 2013-04-06 LAB — LIPID PANEL
CHOL/HDL RATIO: 4
Cholesterol: 136 mg/dL (ref 0–200)
HDL: 36.2 mg/dL — ABNORMAL LOW (ref >39.00)
LDL CALC: 83 mg/dL (ref 0–99)
Triglycerides: 82 mg/dL (ref 0.0–149.0)
VLDL: 16.4 mg/dL (ref 0.0–40.0)

## 2013-04-06 LAB — HEPATIC FUNCTION PANEL
ALBUMIN: 4.2 g/dL (ref 3.5–5.2)
ALK PHOS: 49 U/L (ref 39–117)
ALT: 25 U/L (ref 0–53)
AST: 23 U/L (ref 0–37)
Bilirubin, Direct: 0.1 mg/dL (ref 0.0–0.3)
TOTAL PROTEIN: 6.7 g/dL (ref 6.0–8.3)
Total Bilirubin: 0.8 mg/dL (ref 0.3–1.2)

## 2013-04-06 LAB — POCT URINALYSIS DIPSTICK
Bilirubin, UA: NEGATIVE
GLUCOSE UA: NEGATIVE
Ketones, UA: NEGATIVE
LEUKOCYTES UA: NEGATIVE
NITRITE UA: NEGATIVE
SPEC GRAV UA: 1.02
UROBILINOGEN UA: 0.2
pH, UA: 7

## 2013-04-06 LAB — CBC WITH DIFFERENTIAL/PLATELET
BASOS PCT: 0.5 % (ref 0.0–3.0)
Basophils Absolute: 0 10*3/uL (ref 0.0–0.1)
EOS PCT: 6.8 % — AB (ref 0.0–5.0)
Eosinophils Absolute: 0.4 10*3/uL (ref 0.0–0.7)
HCT: 45 % (ref 39.0–52.0)
HEMOGLOBIN: 15 g/dL (ref 13.0–17.0)
LYMPHS PCT: 25.7 % (ref 12.0–46.0)
Lymphs Abs: 1.7 10*3/uL (ref 0.7–4.0)
MCHC: 33.4 g/dL (ref 30.0–36.0)
MCV: 88 fl (ref 78.0–100.0)
Monocytes Absolute: 0.7 10*3/uL (ref 0.1–1.0)
Monocytes Relative: 10.2 % (ref 3.0–12.0)
NEUTROS ABS: 3.7 10*3/uL (ref 1.4–7.7)
NEUTROS PCT: 56.8 % (ref 43.0–77.0)
Platelets: 187 10*3/uL (ref 150.0–400.0)
RBC: 5.12 Mil/uL (ref 4.22–5.81)
RDW: 13.4 % (ref 11.5–14.6)
WBC: 6.5 10*3/uL (ref 4.5–10.5)

## 2013-04-06 LAB — PSA: PSA: 0.32 ng/mL (ref 0.10–4.00)

## 2013-04-06 LAB — TSH: TSH: 3.05 u[IU]/mL (ref 0.35–5.50)

## 2013-04-17 ENCOUNTER — Encounter: Payer: Self-pay | Admitting: Internal Medicine

## 2013-04-17 ENCOUNTER — Ambulatory Visit (INDEPENDENT_AMBULATORY_CARE_PROVIDER_SITE_OTHER): Payer: BC Managed Care – PPO | Admitting: Internal Medicine

## 2013-04-17 VITALS — BP 130/90 | HR 58 | Temp 98.0°F | Resp 20 | Ht 67.5 in | Wt 204.0 lb

## 2013-04-17 DIAGNOSIS — Z0000 Encounter for general adult medical examination without abnormal findings: Secondary | ICD-10-CM

## 2013-04-17 DIAGNOSIS — E785 Hyperlipidemia, unspecified: Secondary | ICD-10-CM

## 2013-04-17 DIAGNOSIS — I10 Essential (primary) hypertension: Secondary | ICD-10-CM

## 2013-04-17 DIAGNOSIS — Z23 Encounter for immunization: Secondary | ICD-10-CM

## 2013-04-17 DIAGNOSIS — I2510 Atherosclerotic heart disease of native coronary artery without angina pectoris: Secondary | ICD-10-CM

## 2013-04-17 MED ORDER — ATENOLOL 50 MG PO TABS: 50.0000 mg | ORAL_TABLET | Freq: Every day | ORAL | Status: DC

## 2013-04-17 MED ORDER — ROSUVASTATIN CALCIUM 10 MG PO TABS: 10.0000 mg | ORAL_TABLET | Freq: Every day | ORAL | Status: DC

## 2013-04-17 NOTE — Progress Notes (Signed)
Pre-visit discussion using our clinic review tool. No additional management support is needed unless otherwise documented below in the visit note.  

## 2013-04-17 NOTE — Patient Instructions (Addendum)
Limit your sodium (Salt) intake    It is important that you exercise regularly, at least 20 minutes 3 to 4 times per week.  If you develop chest pain or shortness of breath seek  medical attention.  Return in one year for follow-up Gout Gout is an inflammatory arthritis caused by a buildup of uric acid crystals in the joints. Uric acid is a chemical that is normally present in the blood. When the level of uric acid in the blood is too high it can form crystals that deposit in your joints and tissues. This causes joint redness, soreness, and swelling (inflammation). Repeat attacks are common. Over time, uric acid crystals can form into masses (tophi) near a joint, destroying bone and causing disfigurement. Gout is treatable and often preventable. CAUSES  The disease begins with elevated levels of uric acid in the blood. Uric acid is produced by your body when it breaks down a naturally found substance called purines. Certain foods you eat, such as meats and fish, contain high amounts of purines. Causes of an elevated uric acid level include:  Being passed down from parent to child (heredity).  Diseases that cause increased uric acid production (such as obesity, psoriasis, and certain cancers).  Excessive alcohol use.  Diet, especially diets rich in meat and seafood.  Medicines, including certain cancer-fighting medicines (chemotherapy), water pills (diuretics), and aspirin.  Chronic kidney disease. The kidneys are no longer able to remove uric acid well.  Problems with metabolism. Conditions strongly associated with gout include:  Obesity.  High blood pressure.  High cholesterol.  Diabetes. Not everyone with elevated uric acid levels gets gout. It is not understood why some people get gout and others do not. Surgery, joint injury, and eating too much of certain foods are some of the factors that can lead to gout attacks. SYMPTOMS   An attack of gout comes on quickly. It causes  intense pain with redness, swelling, and warmth in a joint.  Fever can occur.  Often, only one joint is involved. Certain joints are more commonly involved:  Base of the big toe.  Knee.  Ankle.  Wrist.  Finger. Without treatment, an attack usually goes away in a few days to weeks. Between attacks, you usually will not have symptoms, which is different from many other forms of arthritis. DIAGNOSIS  Your caregiver will suspect gout based on your symptoms and exam. In some cases, tests may be recommended. The tests may include:  Blood tests.  Urine tests.  X-rays.  Joint fluid exam. This exam requires a needle to remove fluid from the joint (arthrocentesis). Using a microscope, gout is confirmed when uric acid crystals are seen in the joint fluid. TREATMENT  There are two phases to gout treatment: treating the sudden onset (acute) attack and preventing attacks (prophylaxis).  Treatment of an Acute Attack.  Medicines are used. These include anti-inflammatory medicines or steroid medicines.  An injection of steroid medicine into the affected joint is sometimes necessary.  The painful joint is rested. Movement can worsen the arthritis.  You may use warm or cold treatments on painful joints, depending which works best for you.  Treatment to Prevent Attacks.  If you suffer from frequent gout attacks, your caregiver may advise preventive medicine. These medicines are started after the acute attack subsides. These medicines either help your kidneys eliminate uric acid from your body or decrease your uric acid production. You may need to stay on these medicines for a very long time.  The early phase of treatment with preventive medicine can be associated with an increase in acute gout attacks. For this reason, during the first few months of treatment, your caregiver may also advise you to take medicines usually used for acute gout treatment. Be sure you understand your caregiver's  directions. Your caregiver may make several adjustments to your medicine dose before these medicines are effective.  Discuss dietary treatment with your caregiver or dietitian. Alcohol and drinks high in sugar and fructose and foods such as meat, poultry, and seafood can increase uric acid levels. Your caregiver or dietician can advise you on drinks and foods that should be limited. HOME CARE INSTRUCTIONS   Do not take aspirin to relieve pain. This raises uric acid levels.  Only take over-the-counter or prescription medicines for pain, discomfort, or fever as directed by your caregiver.  Rest the joint as much as possible. When in bed, keep sheets and blankets off painful areas.  Keep the affected joint raised (elevated).  Apply warm or cold treatments to painful joints. Use of warm or cold treatments depends on which works best for you.  Use crutches if the painful joint is in your leg.  Drink enough fluids to keep your urine clear or pale yellow. This helps your body get rid of uric acid. Limit alcohol, sugary drinks, and fructose drinks.  Follow your dietary instructions. Pay careful attention to the amount of protein you eat. Your daily diet should emphasize fruits, vegetables, whole grains, and fat-free or low-fat milk products. Discuss the use of coffee, vitamin C, and cherries with your caregiver or dietician. These may be helpful in lowering uric acid levels.  Maintain a healthy body weight. SEEK MEDICAL CARE IF:   You develop diarrhea, vomiting, or any side effects from medicines.  You do not feel better in 24 hours, or you are getting worse. SEEK IMMEDIATE MEDICAL CARE IF:   Your joint becomes suddenly more tender, and you have chills or a fever. MAKE SURE YOU:   Understand these instructions.  Will watch your condition.  Will get help right away if you are not doing well or get worse. Document Released: 12/27/1999 Document Revised: 04/25/2012 Document Reviewed:  08/12/2011 [facility] Patient Information 2014 [place], [place]. Purine Restricted Diet A low-purine diet consists of foods that reduce uric acid made in your body. INDICATIONS FOR USE  Your caregiver may ask you to follow a low-purine diet to reduce gout flairs.  GUIDELINES  Avoid high-purine foods, including all alcohol, yeast extracts taken as supplements, and sauces made from meats (like gravy). Do not eat high-purine meats, including anchovies, sardines, herring, mussels, tuna, codfish, scallops, trout, haddock, bacon, organ meats, tripe, goose, wild game, and sweetbreads.  Grains  Allowed/Recommended: All, except those listed to consume in moderation.  Consume in Moderation: Oatmeal ( cup uncooked daily), wheat bran or germ ( cup daily), and whole grains. Vegetables  Allowed/Recommended: All, except those listed to consume in moderation.  Consume in Moderation: Asparagus, cauliflower, spinach, mushrooms, and green peas ( cup daily). Fruit  Allowed/Recommended: All.  Consume in Moderation: None. Meat and Meat Substitutes  Allowed/Recommended: Eggs, nuts, and peanut butter.  Consume in Moderation: Limit to 4 to 6 oz daily. Avoid high-purine meats. Lentils, peas, and dried beans (1 cup daily). Milk  Allowed/Recommended: All. Choose low-fat or skim when possible.  Consume in Moderation: None. Fats and Oils  Allowed/Recommended: All.  Consume in Moderation: None. Beverages  Allowed/Recommended: All, except those listed to avoid.  Avoid: All  alcohol. Condiments/Miscellaneous  Allowed/Recommended: All, except those listed to consume in moderation.  Consume in Moderation: Bouillon and meat-based broths and soups. Document Released: 04/25/2010 Document Revised: 03/23/2011 Document Reviewed: 04/25/2010 [facility] Patient Information 2014 [place], [place].

## 2013-04-17 NOTE — Progress Notes (Signed)
Subjective:    Patient ID: Joel Payne, male    DOB: 02-Feb-1945, 68 y.o.   MRN: [id number]  HPI  68 year old patient who is seen today for a wellness exam. Medical problems include stable coronary artery disease. He has fairly mild nonobstructive disease and remains asymptomatic. He has a history of dyslipidemia but has been intolerant of statins in the past.  Presently, he has done well on a regimen of Crestor 10 mg twice weekly.  He has hypertension. 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 No voiding issues. He has history of nephrolithiasis which has been stable. He is had 2 episodes of pain and swelling involving his right great toe.  Father had a history of gouty arthritis  Past Medical History  Diagnosis Date  . ALLERGIC RHINITIS 11/08/2006  . BENIGN PROSTATIC HYPERTROPHY 10/12/2006  . CORONARY ARTERY DISEASE 10/12/2006    Catheterization was normal 2012, mild nonobstructive coronary disease, normal LV function  . HYPERLIPIDEMIA 11/08/2006  . HYPERTENSION 10/12/2006  . NEPHROLITHIASIS, HX OF 10/12/2006  . Urinary frequency 10/18/2009  . PPD positive   . Shortness of breath     April, 2012  /  catheterization May, 2012 mild nonobstructive coronary disease  . Kidney stone     in 1980    History   Social History  . Marital Status: Single    Spouse Name: N/A    Number of Children: N/A  . Years of Education: N/A   Occupational History  . Not on file.   Social History Main Topics  . Smoking status: Never Smoker   . Smokeless tobacco: Never Used  . Alcohol Use: 0.5 oz/week    1 drink(s) per week     Comment: occasionally  . Drug Use: No  . Sexual Activity: Not on file   Other Topics Concern  . Not on file   Social History Narrative  . No narrative on file    Past Surgical History  Procedure Laterality Date  . Cardiac catheterization  2012  . Prostate biopsy  2008  . Non radical prostatectomy  08/2011    Family  History  Problem Relation Age of Onset  . Colon cancer Neg Hx   . Esophageal cancer Neg Hx   . Rectal cancer Neg Hx   . Stomach cancer Neg Hx     Allergies  Allergen Reactions  . Simvastatin Other (See Comments)    Leg cramps    Current Outpatient Prescriptions on File Prior to Visit  Medication Sig Dispense Refill  . aspirin EC 81 MG tablet Take 81 mg by mouth daily.      Marland Kitchen. omega-3 acid ethyl esters (LOVAZA) 1 G capsule Take 1 g by mouth daily.       No current facility-administered medications on file prior to visit.    BP 130/90  Pulse 58  Temp(Src) 98 F (36.7 C) (Oral)  Resp 20  Ht 5' 7.5" (1.715 m)  Wt 204 lb (92.534 kg)  BMI 31.46 kg/m2  SpO2 97%       Review of Systems  Constitutional: Negative for fever, chills, activity change, appetite change and fatigue.  HENT: Negative for congestion, dental problem, ear pain, hearing loss, mouth sores, rhinorrhea, sinus pressure, sneezing, tinnitus, trouble swallowing and voice change.   Eyes: Negative for photophobia, pain, redness and visual disturbance.  Respiratory: Negative for apnea, cough, choking, chest tightness, shortness of breath and wheezing.   Cardiovascular:  Negative for chest pain, palpitations and leg swelling.  Gastrointestinal: Negative for nausea, vomiting, abdominal pain, diarrhea, constipation, blood in stool, abdominal distention, anal bleeding and rectal pain.  Genitourinary: Negative for dysuria, urgency, frequency, hematuria, flank pain, decreased urine volume, discharge, penile swelling, scrotal swelling, difficulty urinating, genital sores and testicular pain.  Musculoskeletal: Negative for arthralgias, back pain, gait problem, joint swelling, myalgias, neck pain and neck stiffness.  Skin: Negative for color change, rash and wound.  Neurological: Negative for dizziness, tremors, seizures, syncope, facial asymmetry, speech difficulty, weakness, light-headedness, numbness and headaches.   Hematological: Negative for adenopathy. Does not bruise/bleed easily.  Psychiatric/Behavioral: Negative for suicidal ideas, hallucinations, behavioral problems, confusion, sleep disturbance, self-injury, dysphoric mood, decreased concentration and agitation. The patient is not nervous/anxious.        Objective:   Physical Exam  Constitutional: He appears well-developed and well-nourished.  HENT:  Head: Normocephalic and atraumatic.  Right Ear: External ear normal.  Left Ear: External ear normal.  Nose: Nose normal.  Mouth/Throat: Oropharynx is clear and moist.  Eyes: Conjunctivae and EOM are normal. Pupils are equal, round, and reactive to light. No scleral icterus.  Neck: Normal range of motion. Neck supple. No JVD present. No thyromegaly present.  Cardiovascular: Regular rhythm, normal heart sounds and intact distal pulses.  Exam reveals no gallop and no friction rub.   No murmur heard. Pulmonary/Chest: Effort normal and breath sounds normal. He exhibits no tenderness.  Abdominal: Soft. Bowel sounds are normal. He exhibits no distension and no mass. There is no tenderness.  Genitourinary: Penis normal.  Musculoskeletal: Normal range of motion. He exhibits no edema and no tenderness.  Lymphadenopathy:    He has no cervical adenopathy.  Neurological: He is alert. He has normal reflexes. No cranial nerve deficit. Coordination normal.  Skin: Skin is warm and dry. No rash noted.  Psychiatric: He has a normal mood and affect. His behavior is normal.          Assessment & Plan:   Preventive health examination. Patient has had a recent colonoscopy Hypertension stable Dyslipidemia. We'll give a call if twice weekly Crestor. Recheck in 6 months Status post non-radical prostatectomy for BPH and urinary retention stable Possible gouty arthritis.  We'll place on a low purine  diet and observe.  He'll call if he has any recurrent episodes, and we'll check a uric acid level  Medicines  refilled Sampling of Crestor dispensed

## 2013-04-18 ENCOUNTER — Telehealth: Payer: Self-pay | Admitting: Internal Medicine

## 2013-04-18 NOTE — Telephone Encounter (Signed)
Relevant patient education assigned to patient using Emmi. ° °

## 2013-07-02 ENCOUNTER — Other Ambulatory Visit: Payer: Self-pay | Admitting: Internal Medicine

## 2014-03-27 IMAGING — CR DG CHEST 1V PORT
1 series · 1 of 1 positions shown · non-contrast
Comparison: None.

CLINICAL DATA: Fever and cough.

PORTABLE CHEST - 1 VIEW

[AP]
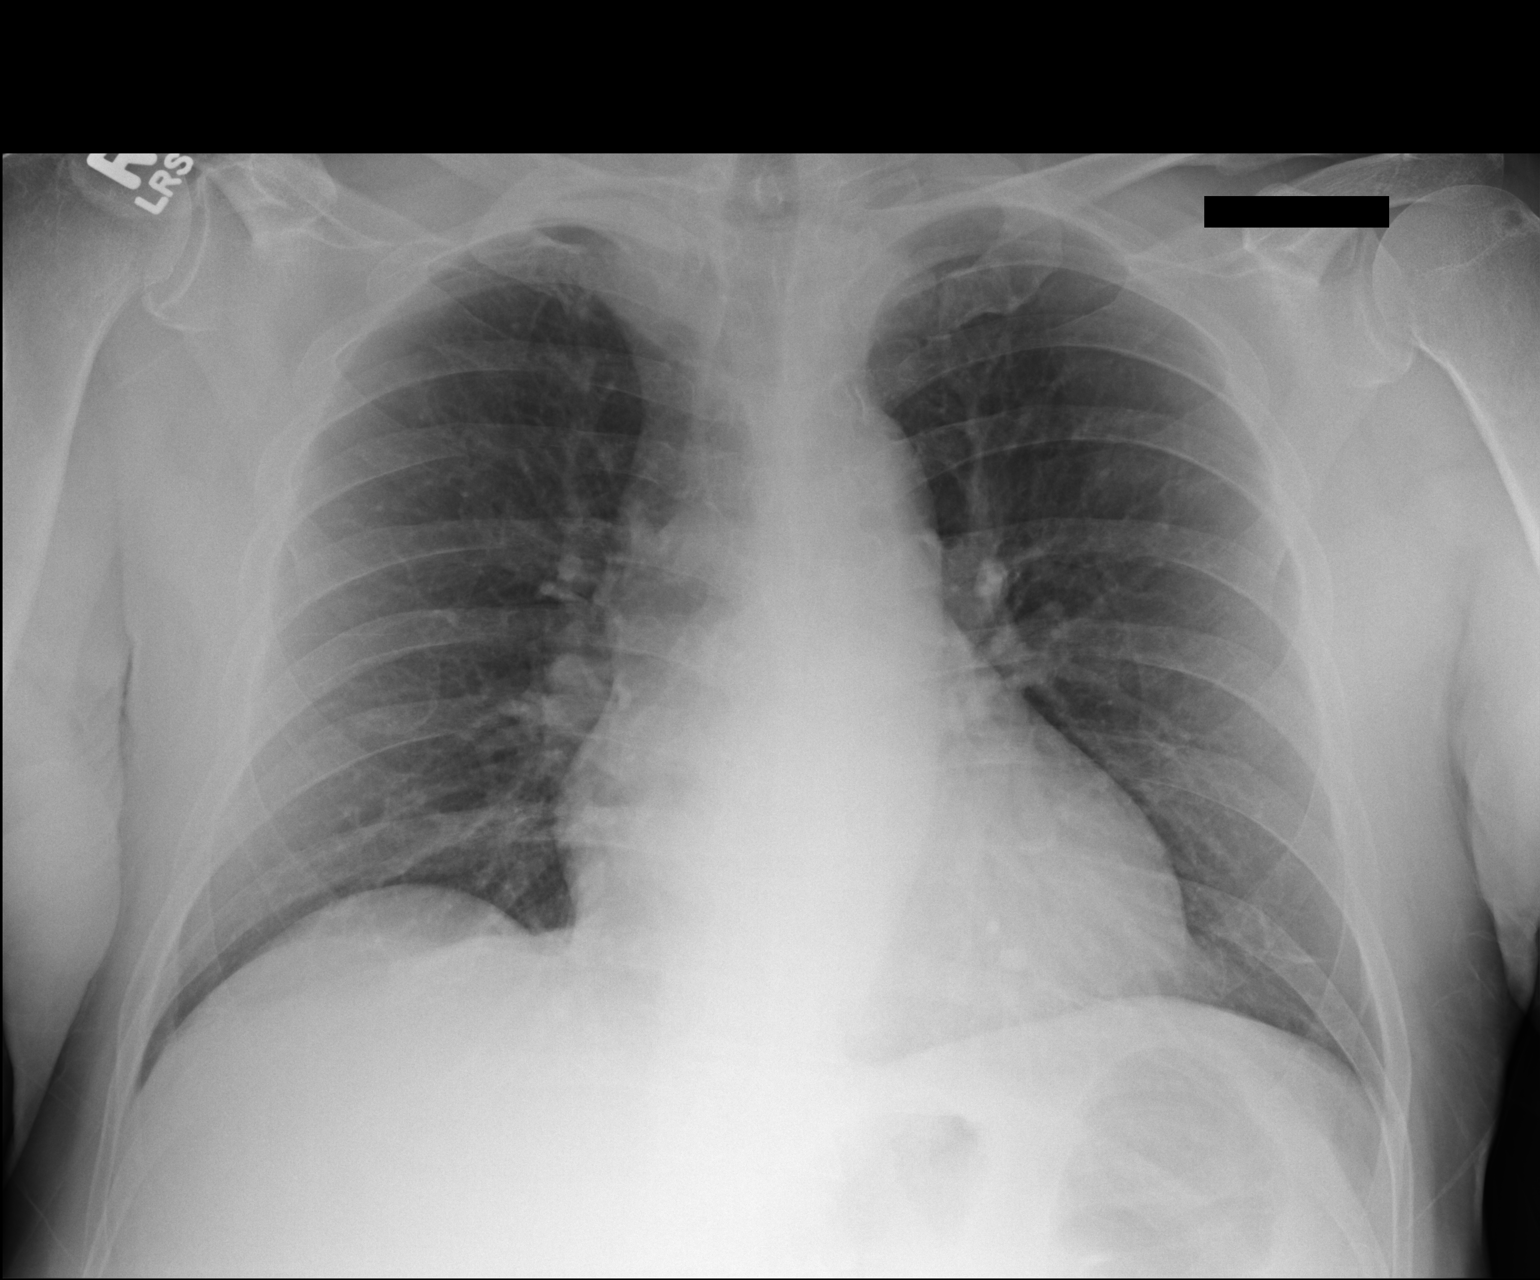

[1 of 1 positions shown; findings below may reference images not displayed]

FINDINGS: Lungs are clear.  Heart size is normal.  No pneumothorax
or pleural fluid.
IMPRESSION: No acute disease.

## 2014-08-07 ENCOUNTER — Encounter: Payer: Self-pay | Admitting: Internal Medicine

## 2014-08-07 ENCOUNTER — Ambulatory Visit (INDEPENDENT_AMBULATORY_CARE_PROVIDER_SITE_OTHER): Payer: BC Managed Care – PPO | Admitting: Internal Medicine

## 2014-08-07 VITALS — BP 150/80 | HR 59 | Temp 98.2°F | Resp 20 | Ht 67.5 in | Wt 205.0 lb

## 2014-08-07 DIAGNOSIS — L720 Epidermal cyst: Secondary | ICD-10-CM | POA: Diagnosis not present

## 2014-08-07 DIAGNOSIS — I10 Essential (primary) hypertension: Secondary | ICD-10-CM

## 2014-08-07 MED ORDER — CEPHALEXIN 500 MG PO CAPS: 500.0000 mg | ORAL_CAPSULE | Freq: Four times a day (QID) | ORAL | Status: DC

## 2014-08-07 NOTE — Patient Instructions (Signed)
Epidermal Cyst An epidermal cyst is sometimes called a sebaceous cyst, epidermal inclusion cyst, or infundibular cyst. These cysts usually contain a substance that looks "pasty" or "cheesy" and may have a bad smell. This substance is a protein called keratin. Epidermal cysts are usually found on the face, neck, or trunk. They may also occur in the vaginal area or other parts of the genitalia of both men and women. Epidermal cysts are usually small, painless, slow-growing bumps or lumps that move freely under the skin. It is important not to try to pop them. This may cause an infection and lead to tenderness and swelling. CAUSES  Epidermal cysts may be caused by a deep penetrating injury to the skin or a plugged hair follicle, often associated with acne. SYMPTOMS  Epidermal cysts can become inflamed and cause:  Redness.  Tenderness.  Increased temperature of the skin over the bumps or lumps.  Grayish-white, bad smelling material that drains from the bump or lump. DIAGNOSIS  Epidermal cysts are easily diagnosed by your caregiver during an exam. Rarely, a tissue sample (biopsy) may be taken to rule out other conditions that may resemble epidermal cysts. TREATMENT   Epidermal cysts often get better and disappear on their own. They are rarely ever cancerous.  If a cyst becomes infected, it may become inflamed and tender. This may require opening and draining the cyst. Treatment with antibiotics may be necessary. When the infection is gone, the cyst may be removed with minor surgery.  Small, inflamed cysts can often be treated with antibiotics or by injecting steroid medicines.  Sometimes, epidermal cysts become large and bothersome. If this happens, surgical removal in your caregiver's office may be necessary. HOME CARE INSTRUCTIONS  Only take over-the-counter or prescription medicines as directed by your caregiver.  Take your antibiotics as directed. Finish them even if you start to feel  better. SEEK MEDICAL CARE IF:   Your cyst becomes tender, red, or swollen.  Your condition is not improving or is getting worse.  You have any other questions or concerns. MAKE SURE YOU:  Understand these instructions.  Will watch your condition.  Will get help right away if you are not doing well or get worse. Document Released: 11/30/2003 Document Revised: 03/23/2011 Document Reviewed: 07/07/2010 [employer] Patient Information 2015 [employer]. This information is not intended to replace advice given to you by your health care provider. Make sure you discuss any questions you have with your health care provider.  

## 2014-08-07 NOTE — Progress Notes (Signed)
Subjective:    Patient ID: Joel Payne, male    DOB: August 11, 1945, 69 y.o.   MRN: [id number]  HPI  69 year old patient who presents with a one-week history of an enlarging painful, red nodule in the right posterior neck area He has essential hypertension which has been well-controlled  Past Medical History  Diagnosis Date  . ALLERGIC RHINITIS 11/08/2006  . BENIGN PROSTATIC HYPERTROPHY 10/12/2006  . CORONARY ARTERY DISEASE 10/12/2006    Catheterization was normal 2012, mild nonobstructive coronary disease, normal LV function  . HYPERLIPIDEMIA 11/08/2006  . HYPERTENSION 10/12/2006  . NEPHROLITHIASIS, HX OF 10/12/2006  . Urinary frequency 10/18/2009  . PPD positive   . Shortness of breath     April, 2012  /  catheterization May, 2012 mild nonobstructive coronary disease  . Kidney stone     in 1980    History   Social History  . Marital Status: Single    Spouse Name: N/A  . Number of Children: N/A  . Years of Education: N/A   Occupational History  . Not on file.   Social History Main Topics  . Smoking status: Never Smoker   . Smokeless tobacco: Never Used  . Alcohol Use: 0.5 oz/week    1 drink(s) per week     Comment: occasionally  . Drug Use: No  . Sexual Activity: Not on file   Other Topics Concern  . Not on file   Social History Narrative    Past Surgical History  Procedure Laterality Date  . Cardiac catheterization  2012  . Prostate biopsy  2008  . Non radical prostatectomy  08/2011    Family History  Problem Relation Age of Onset  . Colon cancer Neg Hx   . Esophageal cancer Neg Hx   . Rectal cancer Neg Hx   . Stomach cancer Neg Hx     Allergies  Allergen Reactions  . Simvastatin Other (See Comments)    Leg cramps    Current Outpatient Prescriptions on File Prior to Visit  Medication Sig Dispense Refill  . aspirin EC 81 MG tablet Take 81 mg by mouth daily.    Marland Kitchen atenolol (TENORMIN) 50 MG tablet TAKE 1 TABLET BY MOUTH EVERY DAY 90 tablet 3  .  omega-3 acid ethyl esters (LOVAZA) 1 G capsule Take 1 g by mouth daily.    . rosuvastatin (CRESTOR) 10 MG tablet Take 1 tablet (10 mg total) by mouth daily. 90 tablet 3   No current facility-administered medications on file prior to visit.    BP 150/80 mmHg  Pulse 59  Temp(Src) 98.2 F (36.8 C) (Oral)  Resp 20  Ht 5' 7.5" (1.715 m)  Wt 205 lb (92.987 kg)  BMI 31.62 kg/m2  SpO2 97%     Review of Systems  Constitutional: Negative for fever, chills, appetite change and fatigue.  HENT: Negative for congestion, dental problem, ear pain, hearing loss, sore throat, tinnitus, trouble swallowing and voice change.   Eyes: Negative for pain, discharge and visual disturbance.  Respiratory: Negative for cough, chest tightness, wheezing and stridor.   Cardiovascular: Negative for chest pain, palpitations and leg swelling.  Gastrointestinal: Negative for nausea, vomiting, abdominal pain, diarrhea, constipation, blood in stool and abdominal distention.  Genitourinary: Negative for urgency, hematuria, flank pain, discharge, difficulty urinating and genital sores.  Musculoskeletal: Negative for myalgias, back pain, joint swelling, arthralgias, gait problem and neck stiffness.  Skin: Positive for wound. Negative for rash.  Neurological: Negative for dizziness, syncope, speech difficulty,  weakness, numbness and headaches.  Hematological: Negative for adenopathy. Does not bruise/bleed easily.  Psychiatric/Behavioral: Negative for behavioral problems and dysphoric mood. The patient is not nervous/anxious.        Objective:   Physical Exam  Constitutional: He appears well-developed and well-nourished. No distress.  Blood pressure well controlled Afebrile  Neck:  3 cm erythematous tender nodule, right posterior neck area          Assessment & Plan:   Infected epidermoid cyst, right posterior neck  Procedure note.  After informed consent, local, prepped with Betadine and alcohol swabs and  local anesthesia with 1% Xylocaine, a small incision was made.  Cyst contents were evacuated with pressure and blood dissection.  Antibiotic ointment applied and the wound dressed Local wound care discussed

## 2014-08-07 NOTE — Progress Notes (Signed)
Pre visit review using our clinic review tool, if applicable. No additional management support is needed unless otherwise documented below in the visit note. 

## 2014-09-10 ENCOUNTER — Other Ambulatory Visit (INDEPENDENT_AMBULATORY_CARE_PROVIDER_SITE_OTHER): Payer: BC Managed Care – PPO

## 2014-09-10 DIAGNOSIS — Z0000 Encounter for general adult medical examination without abnormal findings: Secondary | ICD-10-CM | POA: Diagnosis not present

## 2014-09-10 LAB — POCT URINALYSIS DIPSTICK
BILIRUBIN UA: NEGATIVE
GLUCOSE UA: NEGATIVE
Ketones, UA: NEGATIVE
Leukocytes, UA: NEGATIVE
NITRITE UA: NEGATIVE
Spec Grav, UA: 1.015
Urobilinogen, UA: 0.2
pH, UA: 8

## 2014-09-10 LAB — BASIC METABOLIC PANEL
BUN: 15 mg/dL (ref 6–23)
CO2: 30 mEq/L (ref 19–32)
Calcium: 9.2 mg/dL (ref 8.4–10.5)
Chloride: 104 mEq/L (ref 96–112)
Creatinine, Ser: 0.84 mg/dL (ref 0.40–1.50)
GFR: 96.17 mL/min (ref >60.00)
Glucose, Bld: 97 mg/dL (ref 70–99)
POTASSIUM: 4.2 meq/L (ref 3.5–5.1)
SODIUM: 142 meq/L (ref 135–145)

## 2014-09-10 LAB — CBC WITH DIFFERENTIAL/PLATELET
BASOS ABS: 0 10*3/uL (ref 0.0–0.1)
Basophils Relative: 0.5 % (ref 0.0–3.0)
EOS PCT: 9.9 % — AB (ref 0.0–5.0)
Eosinophils Absolute: 0.6 10*3/uL (ref 0.0–0.7)
HCT: 46.2 % (ref 39.0–52.0)
HEMOGLOBIN: 15.7 g/dL (ref 13.0–17.0)
Lymphocytes Relative: 27.8 % (ref 12.0–46.0)
Lymphs Abs: 1.7 10*3/uL (ref 0.7–4.0)
MCHC: 33.9 g/dL (ref 30.0–36.0)
MCV: 87.8 fl (ref 78.0–100.0)
MONOS PCT: 9.6 % (ref 3.0–12.0)
Monocytes Absolute: 0.6 10*3/uL (ref 0.1–1.0)
Neutro Abs: 3.1 10*3/uL (ref 1.4–7.7)
Neutrophils Relative %: 52.2 % (ref 43.0–77.0)
Platelets: 193 10*3/uL (ref 150.0–400.0)
RBC: 5.26 Mil/uL (ref 4.22–5.81)
RDW: 13.6 % (ref 11.5–15.5)
WBC: 6 10*3/uL (ref 4.0–10.5)

## 2014-09-10 LAB — HEPATIC FUNCTION PANEL
ALBUMIN: 4.3 g/dL (ref 3.5–5.2)
ALK PHOS: 46 U/L (ref 39–117)
ALT: 24 U/L (ref 0–53)
AST: 21 U/L (ref 0–37)
BILIRUBIN TOTAL: 1 mg/dL (ref 0.2–1.2)
Bilirubin, Direct: 0.1 mg/dL (ref 0.0–0.3)
Total Protein: 7 g/dL (ref 6.0–8.3)

## 2014-09-10 LAB — LIPID PANEL
CHOL/HDL RATIO: 6
CHOLESTEROL: 214 mg/dL — AB (ref 0–200)
HDL: 33.3 mg/dL — ABNORMAL LOW (ref >39.00)
LDL CALC: 156 mg/dL — AB (ref 0–99)
NonHDL: 181.07
TRIGLYCERIDES: 126 mg/dL (ref 0.0–149.0)
VLDL: 25.2 mg/dL (ref 0.0–40.0)

## 2014-09-10 LAB — PSA: PSA: 0.35 ng/mL (ref 0.10–4.00)

## 2014-09-10 LAB — TSH: TSH: 2.81 u[IU]/mL (ref 0.35–4.50)

## 2014-09-18 ENCOUNTER — Encounter: Payer: Self-pay | Admitting: Internal Medicine

## 2014-09-18 ENCOUNTER — Ambulatory Visit (INDEPENDENT_AMBULATORY_CARE_PROVIDER_SITE_OTHER): Payer: BC Managed Care – PPO | Admitting: Internal Medicine

## 2014-09-18 VITALS — BP 150/90 | HR 59 | Temp 98.2°F | Ht 67.5 in | Wt 206.0 lb

## 2014-09-18 DIAGNOSIS — E785 Hyperlipidemia, unspecified: Secondary | ICD-10-CM

## 2014-09-18 DIAGNOSIS — J309 Allergic rhinitis, unspecified: Secondary | ICD-10-CM | POA: Diagnosis not present

## 2014-09-18 DIAGNOSIS — I10 Essential (primary) hypertension: Secondary | ICD-10-CM | POA: Diagnosis not present

## 2014-09-18 DIAGNOSIS — Z0000 Encounter for general adult medical examination without abnormal findings: Secondary | ICD-10-CM | POA: Diagnosis not present

## 2014-09-18 DIAGNOSIS — Z23 Encounter for immunization: Secondary | ICD-10-CM | POA: Diagnosis not present

## 2014-09-18 MED ORDER — PRAVASTATIN SODIUM 20 MG PO TABS: 20.0000 mg | ORAL_TABLET | Freq: Every day | ORAL | Status: DC

## 2014-09-18 NOTE — Progress Notes (Signed)
Subjective:    Patient ID: Joel Payne, male    DOB: 07-12-1945, 69 y.o.   MRN: [id number]  HPI 69 -year-old patient who is seen today for a wellness exam.  Medical problems include stable coronary artery disease. He has fairly mild nonobstructive disease and remains asymptomatic. He has a history of dyslipidemia but has been intolerant of statins in the past.  Most recently, he did well on low-dose Crestor twice weekly but has had to also discontinue.  He has hypertension. 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 No voiding issues. He has history of nephrolithiasis which has been stable. He is had 2 episodes of pain and swelling involving his right great toe.  Father had a history of gouty arthritis  Colonoscopy 2014  BP Readings from Last 3 Encounters:  09/18/14 158/100  08/07/14 150/80  04/17/13 130/90    Past Medical History  Diagnosis Date  . ALLERGIC RHINITIS 11/08/2006  . BENIGN PROSTATIC HYPERTROPHY 10/12/2006  . CORONARY ARTERY DISEASE 10/12/2006    Catheterization was normal 2012, mild nonobstructive coronary disease, normal LV function  . HYPERLIPIDEMIA 11/08/2006  . HYPERTENSION 10/12/2006  . NEPHROLITHIASIS, HX OF 10/12/2006  . Urinary frequency 10/18/2009  . PPD positive   . Shortness of breath     April, 2012  /  catheterization May, 2012 mild nonobstructive coronary disease  . Kidney stone     in 1980    Social History   Social History  . Marital Status: Single    Spouse Name: N/A  . Number of Children: N/A  . Years of Education: N/A   Occupational History  . Not on file.   Social History Main Topics  . Smoking status: Never Smoker   . Smokeless tobacco: Never Used  . Alcohol Use: 0.5 oz/week    1 drink(s) per week     Comment: occasionally  . Drug Use: No  . Sexual Activity: Not on file   Other Topics Concern  . Not on file   Social History Narrative    Past Surgical History  Procedure Laterality  Date  . Cardiac catheterization  2012  . Prostate biopsy  2008  . Non radical prostatectomy  08/2011    Family History  Problem Relation Age of Onset  . Colon cancer Neg Hx   . Esophageal cancer Neg Hx   . Rectal cancer Neg Hx   . Stomach cancer Neg Hx     Allergies  Allergen Reactions  . Simvastatin Other (See Comments)    Leg cramps    Current Outpatient Prescriptions on File Prior to Visit  Medication Sig Dispense Refill  . aspirin EC 81 MG tablet Take 81 mg by mouth daily.    Marland Kitchen atenolol (TENORMIN) 50 MG tablet TAKE 1 TABLET BY MOUTH EVERY DAY 90 tablet 3  . omega-3 acid ethyl esters (LOVAZA) 1 G capsule Take 1 g by mouth daily.     No current facility-administered medications on file prior to visit.    BP 158/100 mmHg  Pulse 59  Temp(Src) 98.2 F (36.8 C) (Oral)  Ht 5' 7.5" (1.715 m)  Wt 206 lb (93.441 kg)  BMI 31.77 kg/m2  SpO2 97%       Review of Systems  Constitutional: Negative for fever, chills, activity change, appetite change and fatigue.  HENT: Negative for congestion, dental problem, ear pain, hearing loss, mouth sores, rhinorrhea, sinus pressure, sneezing, tinnitus, trouble swallowing and voice  change.   Eyes: Negative for photophobia, pain, redness and visual disturbance.  Respiratory: Negative for apnea, cough, choking, chest tightness, shortness of breath and wheezing.   Cardiovascular: Negative for chest pain, palpitations and leg swelling.  Gastrointestinal: Negative for nausea, vomiting, abdominal pain, diarrhea, constipation, blood in stool, abdominal distention, anal bleeding and rectal pain.  Genitourinary: Negative for dysuria, urgency, frequency, hematuria, flank pain, decreased urine volume, discharge, penile swelling, scrotal swelling, difficulty urinating, genital sores and testicular pain.  Musculoskeletal: Negative for myalgias, back pain, joint swelling, arthralgias, gait problem, neck pain and neck stiffness.  Skin: Negative for color  change, rash and wound.  Neurological: Negative for dizziness, tremors, seizures, syncope, facial asymmetry, speech difficulty, weakness, light-headedness, numbness and headaches.  Hematological: Negative for adenopathy. Does not bruise/bleed easily.  Psychiatric/Behavioral: Negative for suicidal ideas, hallucinations, behavioral problems, confusion, sleep disturbance, self-injury, dysphoric mood, decreased concentration and agitation. The patient is not nervous/anxious.        Objective:   Physical Exam  Constitutional: He appears well-developed and well-nourished.  HENT:  Head: Normocephalic and atraumatic.  Right Ear: External ear normal.  Left Ear: External ear normal.  Nose: Nose normal.  Mouth/Throat: Oropharynx is clear and moist.  Eyes: Conjunctivae and EOM are normal. Pupils are equal, round, and reactive to light. No scleral icterus.  Neck: Normal range of motion. Neck supple. No JVD present. No thyromegaly present.  Cardiovascular: Regular rhythm, normal heart sounds and intact distal pulses.  Exam reveals no gallop and no friction rub.   No murmur heard. Pulmonary/Chest: Effort normal and breath sounds normal. He exhibits no tenderness.  Abdominal: Soft. Bowel sounds are normal. He exhibits no distension and no mass. There is no tenderness.  Genitourinary: Penis normal.  Musculoskeletal: Normal range of motion. He exhibits no edema or tenderness.  Lymphadenopathy:    He has no cervical adenopathy.  Neurological: He is alert. He has normal reflexes. No cranial nerve deficit. Coordination normal.  Skin: Skin is warm and dry. No rash noted.  Psychiatric: He has a normal mood and affect. His behavior is normal.          Assessment & Plan:   Preventive health examination. Patient has had a recent colonoscopy Hypertension.  Lowest blood pressure 150/90.  We'll place on a DASH diet.  Home blood pressure monitoring.  Encouraged Dyslipidemia. We'll give a trial of  pravastatin 20 mg daily Status post non-radical prostatectomy for BPH and urinary retention stable Possible gouty arthritis.  We'll continue a low purine  diet and observe.  He'll call if he has any recurrent episodes, and we'll check a uric acid level  Medicines refilled

## 2014-09-18 NOTE — Progress Notes (Signed)
Pre visit review using our clinic review tool, if applicable. No additional management support is needed unless otherwise documented below in the visit note. 

## 2014-09-18 NOTE — Patient Instructions (Signed)
Limit your sodium (Salt) intake    It is important that you exercise regularly, at least 20 minutes 3 to 4 times per week.  If you develop chest pain or shortness of breath seek  medical attention.  You need to lose weight.  Consider a lower calorie diet and regular exercise.  Please check your blood pressure on a regular basis.  If it is consistently greater than 150/90, please make an office appointment.  DASH Eating Plan DASH stands for "Dietary Approaches to Stop Hypertension." The DASH eating plan is a healthy eating plan that has been shown to reduce high blood pressure (hypertension). Additional health benefits may include reducing the risk of type 2 diabetes mellitus, heart disease, and stroke. The DASH eating plan may also help with weight loss. WHAT DO I NEED TO KNOW ABOUT THE DASH EATING PLAN? For the DASH eating plan, you will follow these general guidelines:  Choose foods with a percent daily value for sodium of less than 5% (as listed on the food label).  Use salt-free seasonings or herbs instead of table salt or sea salt.  Check with your health care provider or pharmacist before using salt substitutes.  Eat lower-sodium products, often labeled as "lower sodium" or "no salt added."  Eat fresh foods.  Eat more vegetables, fruits, and low-fat dairy products.  Choose whole grains. Look for the word "whole" as the first word in the ingredient list.  Choose fish and skinless chicken or turkey more often than red meat. Limit fish, poultry, and meat to 6 oz (170 g) each day.  Limit sweets, desserts, sugars, and sugary drinks.  Choose heart-healthy fats.  Limit cheese to 1 oz (28 g) per day.  Eat more home-cooked food and less restaurant, buffet, and fast food.  Limit fried foods.  Cook foods using methods other than frying.  Limit canned vegetables. If you do use them, rinse them well to decrease the sodium.  When eating at a restaurant, ask that your food be  prepared with less salt, or no salt if possible. WHAT FOODS CAN I EAT? Seek help from a dietitian for individual calorie needs. Grains Whole grain or whole wheat bread. Brown rice. Whole grain or whole wheat pasta. Quinoa, bulgur, and whole grain cereals. Low-sodium cereals. Corn or whole wheat flour tortillas. Whole grain cornbread. Whole grain crackers. Low-sodium crackers. Vegetables Fresh or frozen vegetables (raw, steamed, roasted, or grilled). Low-sodium or reduced-sodium tomato and vegetable juices. Low-sodium or reduced-sodium tomato sauce and paste. Low-sodium or reduced-sodium canned vegetables.  Fruits All fresh, canned (in natural juice), or frozen fruits. Meat and Other Protein Products Ground beef (85% or leaner), grass-fed beef, or beef trimmed of fat. Skinless chicken or turkey. Ground chicken or turkey. Pork trimmed of fat. All fish and seafood. Eggs. Dried beans, peas, or lentils. Unsalted nuts and seeds. Unsalted canned beans. Dairy Low-fat dairy products, such as skim or 1% milk, 2% or reduced-fat cheeses, low-fat ricotta or cottage cheese, or plain low-fat yogurt. Low-sodium or reduced-sodium cheeses. Fats and Oils Tub margarines without trans fats. Light or reduced-fat mayonnaise and salad dressings (reduced sodium). Avocado. Safflower, olive, or canola oils. Natural peanut or almond butter. Other Unsalted popcorn and pretzels. The items listed above may not be a complete list of recommended foods or beverages. Contact your dietitian for more options. WHAT FOODS ARE NOT RECOMMENDED? Grains White bread. White pasta. White rice. Refined cornbread. Bagels and croissants. Crackers that contain trans fat. Vegetables Creamed or fried vegetables. Vegetables   in a cheese sauce. Regular canned vegetables. Regular canned tomato sauce and paste. Regular tomato and vegetable juices. Fruits Dried fruits. Canned fruit in light or heavy syrup. Fruit juice. Meat and Other Protein  Products Fatty cuts of meat. Ribs, chicken wings, bacon, sausage, bologna, salami, chitterlings, fatback, hot dogs, bratwurst, and packaged luncheon meats. Salted nuts and seeds. Canned beans with salt. Dairy Whole or 2% milk, cream, half-and-half, and cream cheese. Whole-fat or sweetened yogurt. Full-fat cheeses or blue cheese. Nondairy creamers and whipped toppings. Processed cheese, cheese spreads, or cheese curds. Condiments Onion and garlic salt, seasoned salt, table salt, and sea salt. Canned and packaged gravies. Worcestershire sauce. Tartar sauce. Barbecue sauce. Teriyaki sauce. Soy sauce, including reduced sodium. Steak sauce. Fish sauce. Oyster sauce. Cocktail sauce. Horseradish. Ketchup and mustard. Meat flavorings and tenderizers. Bouillon cubes. Hot sauce. Tabasco sauce. Marinades. Taco seasonings. Relishes. Fats and Oils Butter, stick margarine, lard, shortening, ghee, and bacon fat. Coconut, palm kernel, or palm oils. Regular salad dressings. Other Pickles and olives. Salted popcorn and pretzels. The items listed above may not be a complete list of foods and beverages to avoid. Contact your dietitian for more information. WHERE CAN I FIND MORE INFORMATION? National Heart, Lung, and [employer] Institute: www.nhlbi.nih.gov/health/health-topics/topics/dash/ Document Released: 12/18/2010 Document Revised: 05/15/2013 Document Reviewed: 11/02/2012 Exit[employer] Patient Information 2015 [employer]. This information is not intended to replace advice given to you by your health care provider. Make sure you discuss any questions you have with your health care provider. Health Maintenance A healthy lifestyle and preventative care can promote health and wellness.  Maintain regular health, dental, and eye exams.  Eat a healthy diet. Foods like vegetables, fruits, whole grains, low-fat dairy products, and lean protein foods contain the nutrients you need and are low in calories. Decrease your intake  of foods high in solid fats, added sugars, and salt. Get information about a proper diet from your health care provider, if necessary.  Regular physical exercise is one of the most important things you can do for your health. Most adults should get at least 150 minutes of moderate-intensity exercise (any activity that increases your heart rate and causes you to sweat) each week. In addition, most adults need muscle-strengthening exercises on 2 or more days a week.   Maintain a healthy weight. The body mass index (BMI) is a screening tool to identify possible weight problems. It provides an estimate of body fat based on height and weight. Your health care provider can find your BMI and can help you achieve or maintain a healthy weight. For males 20 years and older:  A BMI below 18.5 is considered underweight.  A BMI of 18.5 to 24.9 is normal.  A BMI of 25 to 29.9 is considered overweight.  A BMI of 30 and above is considered obese.  Maintain normal blood lipids and cholesterol by exercising and minimizing your intake of saturated fat. Eat a balanced diet with plenty of fruits and vegetables. Blood tests for lipids and cholesterol should begin at age 20 and be repeated every 5 years. If your lipid or cholesterol levels are high, you are over age 50, or you are at high risk for heart disease, you may need your cholesterol levels checked more frequently.Ongoing high lipid and cholesterol levels should be treated with medicines if diet and exercise are not working.  If you smoke, find out from your health care provider how to quit. If you do not use tobacco, do not start.  Lung cancer   screening is recommended for adults aged 55-80 years who are at high risk for developing lung cancer because of a history of smoking. A yearly low-dose CT scan of the lungs is recommended for people who have at least a 30-pack-year history of smoking and are current smokers or have quit within the past 15 years. A pack  year of smoking is smoking an average of 1 pack of cigarettes a day for 1 year (for example, a 30-pack-year history of smoking could mean smoking 1 pack a day for 30 years or 2 packs a day for 15 years). Yearly screening should continue until the smoker has stopped smoking for at least 15 years. Yearly screening should be stopped for people who develop a health problem that would prevent them from having lung cancer treatment.  If you choose to drink alcohol, do not have more than 2 drinks per day. One drink is considered to be 12 oz (360 mL) of beer, 5 oz (150 mL) of wine, or 1.5 oz (45 mL) of liquor.  Avoid the use of street drugs. Do not share needles with anyone. Ask for help if you need support or instructions about stopping the use of drugs.  High blood pressure causes heart disease and increases the risk of stroke. Blood pressure should be checked at least every 1-2 years. Ongoing high blood pressure should be treated with medicines if weight loss and exercise are not effective.  If you are 45-79 years old, ask your health care provider if you should take aspirin to prevent heart disease.  Diabetes screening involves taking a blood sample to check your fasting blood sugar level. This should be done once every 3 years after age 45 if you are at a normal weight and without risk factors for diabetes. Testing should be considered at a younger age or be carried out more frequently if you are overweight and have at least 1 risk factor for diabetes.  Colorectal cancer can be detected and often prevented. Most routine colorectal cancer screening begins at the age of 50 and continues through age 75. However, your health care provider may recommend screening at an earlier age if you have risk factors for colon cancer. On a yearly basis, your health care provider may provide home test kits to check for hidden blood in the stool. A small camera at the end of a tube may be used to directly examine the colon  (sigmoidoscopy or colonoscopy) to detect the earliest forms of colorectal cancer. Talk to your health care provider about this at age 50 when routine screening begins. A direct exam of the colon should be repeated every 5-10 years through age 75, unless early forms of precancerous polyps or small growths are found.  People who are at an increased risk for hepatitis B should be screened for this virus. You are considered at high risk for hepatitis B if:  You were born in a country where hepatitis B occurs often. Talk with your health care provider about which countries are considered high risk.  Your parents were born in a high-risk country and you have not received a shot to protect against hepatitis B (hepatitis B vaccine).  You have HIV or AIDS.  You use needles to inject street drugs.  You live with, or have sex with, someone who has hepatitis B.  You are a man who has sex with other men (MSM).  You get hemodialysis treatment.  You take certain medicines for conditions like cancer, organ transplantation,   and autoimmune conditions.  Hepatitis C blood testing is recommended for all people born from 1945 through 1965 and any individual with known risk factors for hepatitis C.  Healthy men should no longer receive prostate-specific antigen (PSA) blood tests as part of routine cancer screening. Talk to your health care provider about prostate cancer screening.  Testicular cancer screening is not recommended for adolescents or adult males who have no symptoms. Screening includes self-exam, a health care provider exam, and other screening tests. Consult with your health care provider about any symptoms you have or any concerns you have about testicular cancer.  Practice safe sex. Use condoms and avoid high-risk sexual practices to reduce the spread of sexually transmitted infections (STIs).  You should be screened for STIs, including gonorrhea and chlamydia if:  You are sexually active and  are younger than 24 years.  You are older than 24 years, and your health care provider tells you that you are at risk for this type of infection.  Your sexual activity has changed since you were last screened, and you are at an increased risk for chlamydia or gonorrhea. Ask your health care provider if you are at risk.  If you are at risk of being infected with HIV, it is recommended that you take a prescription medicine daily to prevent HIV infection. This is called pre-exposure prophylaxis (PrEP). You are considered at risk if:  You are a man who has sex with other men (MSM).  You are a heterosexual man who is sexually active with multiple partners.  You take drugs by injection.  You are sexually active with a partner who has HIV.  Talk with your health care provider about whether you are at high risk of being infected with HIV. If you choose to begin PrEP, you should first be tested for HIV. You should then be tested every 3 months for as long as you are taking PrEP.  Use sunscreen. Apply sunscreen liberally and repeatedly throughout the day. You should seek shade when your shadow is shorter than you. Protect yourself by wearing long sleeves, pants, a wide-brimmed hat, and sunglasses year round whenever you are outdoors.  Tell your health care provider of new moles or changes in moles, especially if there is a change in shape or color. Also, tell your health care provider if a mole is larger than the size of a pencil eraser.  A one-time screening for abdominal aortic aneurysm (AAA) and surgical repair of large AAAs by ultrasound is recommended for men aged 65-75 years who are current or former smokers.  Stay current with your vaccines (immunizations). Document Released: 06/27/2007 Document Revised: 01/03/2013 Document Reviewed: 05/26/2010 Exit[employer] Patient Information 2015 [employer]. This information is not intended to replace advice given to you by your health care provider. Make  sure you discuss any questions you have with your health care provider.  

## 2014-09-20 ENCOUNTER — Other Ambulatory Visit: Payer: Self-pay | Admitting: Internal Medicine

## 2015-09-05 ENCOUNTER — Other Ambulatory Visit: Payer: Self-pay | Admitting: Internal Medicine

## 2015-09-05 NOTE — Telephone Encounter (Signed)
Rx refill sent to pharmacy. 

## 2015-10-13 DIAGNOSIS — G459 Transient cerebral ischemic attack, unspecified: Secondary | ICD-10-CM

## 2015-10-13 HISTORY — DX: Transient cerebral ischemic attack, unspecified: G45.9

## 2015-10-27 ENCOUNTER — Emergency Department (HOSPITAL_COMMUNITY): Payer: BC Managed Care – PPO

## 2015-10-27 ENCOUNTER — Inpatient Hospital Stay (HOSPITAL_COMMUNITY)
Admission: EM | Admit: 2015-10-27 | Discharge: 2015-11-01 | DRG: 065 | Disposition: A | Discharge status: Home / Self Care | Payer: BC Managed Care – PPO | Attending: Family Medicine | Admitting: Family Medicine

## 2015-10-27 ENCOUNTER — Encounter (HOSPITAL_COMMUNITY): Payer: Self-pay | Admitting: Emergency Medicine

## 2015-10-27 DIAGNOSIS — H5347 Heteronymous bilateral field defects: Secondary | ICD-10-CM | POA: Diagnosis present

## 2015-10-27 DIAGNOSIS — R29700 NIHSS score 0: Secondary | ICD-10-CM | POA: Diagnosis present

## 2015-10-27 DIAGNOSIS — I639 Cerebral infarction, unspecified: Secondary | ICD-10-CM

## 2015-10-27 DIAGNOSIS — N400 Enlarged prostate without lower urinary tract symptoms: Secondary | ICD-10-CM | POA: Diagnosis present

## 2015-10-27 DIAGNOSIS — H5461 Unqualified visual loss, right eye, normal vision left eye: Secondary | ICD-10-CM | POA: Diagnosis present

## 2015-10-27 DIAGNOSIS — Q211 Atrial septal defect: Secondary | ICD-10-CM

## 2015-10-27 DIAGNOSIS — I63432 Cerebral infarction due to embolism of left posterior cerebral artery: Principal | ICD-10-CM | POA: Diagnosis present

## 2015-10-27 DIAGNOSIS — E785 Hyperlipidemia, unspecified: Secondary | ICD-10-CM | POA: Diagnosis present

## 2015-10-27 DIAGNOSIS — Z23 Encounter for immunization: Secondary | ICD-10-CM

## 2015-10-27 DIAGNOSIS — Z87442 Personal history of urinary calculi: Secondary | ICD-10-CM

## 2015-10-27 DIAGNOSIS — G459 Transient cerebral ischemic attack, unspecified: Secondary | ICD-10-CM

## 2015-10-27 DIAGNOSIS — I2510 Atherosclerotic heart disease of native coronary artery without angina pectoris: Secondary | ICD-10-CM | POA: Diagnosis present

## 2015-10-27 DIAGNOSIS — I4819 Other persistent atrial fibrillation: Secondary | ICD-10-CM

## 2015-10-27 DIAGNOSIS — I480 Paroxysmal atrial fibrillation: Secondary | ICD-10-CM | POA: Diagnosis present

## 2015-10-27 DIAGNOSIS — I4891 Unspecified atrial fibrillation: Secondary | ICD-10-CM

## 2015-10-27 DIAGNOSIS — Z7982 Long term (current) use of aspirin: Secondary | ICD-10-CM

## 2015-10-27 DIAGNOSIS — R319 Hematuria, unspecified: Secondary | ICD-10-CM | POA: Diagnosis present

## 2015-10-27 DIAGNOSIS — I63532 Cerebral infarction due to unspecified occlusion or stenosis of left posterior cerebral artery: Secondary | ICD-10-CM

## 2015-10-27 DIAGNOSIS — I10 Essential (primary) hypertension: Secondary | ICD-10-CM | POA: Diagnosis present

## 2015-10-27 DIAGNOSIS — Z888 Allergy status to other drugs, medicaments and biological substances status: Secondary | ICD-10-CM

## 2015-10-27 DIAGNOSIS — H538 Other visual disturbances: Secondary | ICD-10-CM | POA: Diagnosis present

## 2015-10-27 HISTORY — DX: Cerebral infarction, unspecified: I63.9

## 2015-10-27 HISTORY — DX: Transient cerebral ischemic attack, unspecified: G45.9

## 2015-10-27 LAB — COMPREHENSIVE METABOLIC PANEL
ALK PHOS: 47 U/L (ref 38–126)
ALT: 21 U/L (ref 17–63)
AST: 20 U/L (ref 15–41)
Albumin: 4.4 g/dL (ref 3.5–5.0)
Anion gap: 9 (ref 5–15)
BILIRUBIN TOTAL: 1 mg/dL (ref 0.3–1.2)
BUN: 19 mg/dL (ref 6–20)
CALCIUM: 9.1 mg/dL (ref 8.9–10.3)
CO2: 25 mmol/L (ref 22–32)
CREATININE: 0.94 mg/dL (ref 0.61–1.24)
Chloride: 105 mmol/L (ref 101–111)
Glucose, Bld: 101 mg/dL — ABNORMAL HIGH (ref 65–99)
Potassium: 3.6 mmol/L (ref 3.5–5.1)
Sodium: 139 mmol/L (ref 135–145)
TOTAL PROTEIN: 7.3 g/dL (ref 6.5–8.1)

## 2015-10-27 LAB — DIFFERENTIAL
Basophils Absolute: 0 10*3/uL (ref 0.0–0.1)
Basophils Relative: 1 %
Eosinophils Absolute: 0.4 10*3/uL (ref 0.0–0.7)
Eosinophils Relative: 6 %
LYMPHS PCT: 32 %
Lymphs Abs: 2.1 10*3/uL (ref 0.7–4.0)
MONO ABS: 0.7 10*3/uL (ref 0.1–1.0)
MONOS PCT: 11 %
NEUTROS ABS: 3.3 10*3/uL (ref 1.7–7.7)
Neutrophils Relative %: 50 %

## 2015-10-27 LAB — URINALYSIS, ROUTINE W REFLEX MICROSCOPIC
BILIRUBIN URINE: NEGATIVE
Glucose, UA: NEGATIVE mg/dL
Ketones, ur: NEGATIVE mg/dL
Leukocytes, UA: NEGATIVE
NITRITE: NEGATIVE
Protein, ur: NEGATIVE mg/dL
SPECIFIC GRAVITY, URINE: 1.014 (ref 1.005–1.030)
pH: 5.5 (ref 5.0–8.0)

## 2015-10-27 LAB — RAPID URINE DRUG SCREEN, HOSP PERFORMED
AMPHETAMINES: NOT DETECTED
Barbiturates: NOT DETECTED
Benzodiazepines: NOT DETECTED
Cocaine: NOT DETECTED
Opiates: NOT DETECTED
Tetrahydrocannabinol: NOT DETECTED

## 2015-10-27 LAB — CBC
HEMATOCRIT: 43 % (ref 39.0–52.0)
Hemoglobin: 15.5 g/dL (ref 13.0–17.0)
MCH: 30.2 pg (ref 26.0–34.0)
MCHC: 36 g/dL (ref 30.0–36.0)
MCV: 83.7 fL (ref 78.0–100.0)
Platelets: 185 10*3/uL (ref 150–400)
RBC: 5.14 MIL/uL (ref 4.22–5.81)
RDW: 13.1 % (ref 11.5–15.5)
WBC: 6.5 10*3/uL (ref 4.0–10.5)

## 2015-10-27 LAB — URINE MICROSCOPIC-ADD ON
Bacteria, UA: NONE SEEN
RBC / HPF: NONE SEEN RBC/hpf (ref 0–5)
SQUAMOUS EPITHELIAL / LPF: NONE SEEN
WBC UA: NONE SEEN WBC/hpf (ref 0–5)

## 2015-10-27 LAB — I-STAT CHEM 8, ED
BUN: 19 mg/dL (ref 6–20)
CALCIUM ION: 1.1 mmol/L — AB (ref 1.15–1.40)
CHLORIDE: 102 mmol/L (ref 101–111)
Creatinine, Ser: 1 mg/dL (ref 0.61–1.24)
GLUCOSE: 99 mg/dL (ref 65–99)
HCT: 44 % (ref 39.0–52.0)
Hemoglobin: 15 g/dL (ref 13.0–17.0)
Potassium: 3.6 mmol/L (ref 3.5–5.1)
Sodium: 142 mmol/L (ref 135–145)
TCO2: 26 mmol/L (ref 0–100)

## 2015-10-27 LAB — ETHANOL: ALCOHOL ETHYL (B): 6 mg/dL — AB (ref <5)

## 2015-10-27 LAB — APTT: aPTT: 33 seconds (ref 24–36)

## 2015-10-27 LAB — PROTIME-INR
INR: 1.03
Prothrombin Time: 13.5 seconds (ref 11.4–15.2)

## 2015-10-27 LAB — I-STAT TROPONIN, ED: TROPONIN I, POC: 0 ng/mL (ref 0.00–0.08)

## 2015-10-27 NOTE — ED Notes (Signed)
Patient transported to CT 

## 2015-10-27 NOTE — ED Triage Notes (Addendum)
Pt reports approx 15 minutes ago vision in his right eye became blurry and he begin having a localized headache over the left eye. Denies numbness/tingling and weakness. A&Ox4.

## 2015-10-27 NOTE — ED Provider Notes (Signed)
[facility]-EMERGENCY DEPT Provider Note   CSN: [id number] Arrival date & time: 10/27/15  2148     History   Chief Complaint Chief Complaint  Patient presents with  . Blurred Vision  . Headache    HPI Joel Payne is a 70 y.o. male.  He presents for evaluation of initial onset of a described right visual field cut, with a subsequent left-sided headache, at 9:30 PM tonight. The symptoms both improved, within 30 minutes, and he has only a little "fuzzy vision" in the right eye and a mild headache. No preceding symptoms. No known inciting factors. No extremity weakness, trouble walking, nausea, vomiting, chest pain, palpitations or near syncope. No similar problem in the past. No history of migraine. There are no other known modifying factors. He is taking his usual medications.  HPI  Past Medical History:  Diagnosis Date  . ALLERGIC RHINITIS 11/08/2006  . BENIGN PROSTATIC HYPERTROPHY 10/12/2006  . CORONARY ARTERY DISEASE 10/12/2006   Catheterization was normal 2012, mild nonobstructive coronary disease, normal LV function  . HYPERLIPIDEMIA 11/08/2006  . HYPERTENSION 10/12/2006  . Kidney stone    in 1980  . 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 Shortness of breath    April, 2012  /  catheterization May, 2012 mild nonobstructive coronary disease  . Urinary frequency 10/18/2009    Patient Active Problem List   Diagnosis Date Noted  . Fever 09/08/2011  . UTI (lower urinary tract infection) 09/08/2011  . Hypotension 09/08/2011  . Hypokalemia 09/08/2011  . Dehydration 09/08/2011  . Anemia 09/08/2011  . Leukocytosis 09/08/2011  . Elevated PSA 04/06/2011  . Shortness of breath   . URINARY FREQUENCY 10/18/2009  . Dyslipidemia 11/08/2006  . Allergic rhinitis 11/08/2006  . Essential hypertension 10/12/2006  . BENIGN PROSTATIC HYPERTROPHY- with elevated PSA- preadmit foley 10/12/2006  . NEPHROLITHIASIS, HX OF 10/12/2006  . CORONARY ARTERY DISEASE 10/12/2006     Past Surgical History:  Procedure Laterality Date  . CARDIAC CATHETERIZATION  2012  . non radical prostatectomy  08/2011  . PROSTATE BIOPSY  2008       Home Medications    Prior to Admission medications   Medication Sig Start Date End Date Taking? Authorizing Provider  aspirin EC 81 MG tablet Take 81 mg by mouth daily.   Yes Historical Provider, MD  atenolol (TENORMIN) 25 MG tablet Take 25 mg by mouth daily.   Yes Historical Provider, MD  omega-3 acid ethyl esters (LOVAZA) 1 G capsule Take 1 g by mouth daily.   Yes Historical Provider, MD  pravastatin (PRAVACHOL) 20 MG tablet Take 1 tablet (20 mg total) by mouth daily. 09/18/14  Yes Gordy Savers, MD  atenolol (TENORMIN) 50 MG tablet TAKE 1 TABLET BY MOUTH EVERY DAY Patient not taking: Reported on 10/27/2015 09/05/15   Gordy Savers, MD    Family History Family History  Problem Relation Age of Onset  . Colon cancer Neg Hx   . Esophageal cancer Neg Hx   . Rectal cancer Neg Hx   . Stomach cancer Neg Hx     Social History Social History  Substance Use Topics  . Smoking status: Never Smoker  . Smokeless tobacco: Never Used  . Alcohol use 0.5 oz/week    1 drink(s) per week     Comment: occasionally     Allergies   Simvastatin   Review of Systems Review of Systems  All other systems reviewed and are negative.    Physical  Exam Updated Vital Signs BP 172/84   Pulse 61   Temp 97.8 F (36.6 C)   Resp 19   Ht 5\' 8"  (1.727 m)   Wt 197 lb (89.4 kg)   SpO2 96%   BMI 29.95 kg/m   Physical Exam  Constitutional: He is oriented to person, place, and time. He appears well-developed and well-nourished.  HENT:  Head: Normocephalic and atraumatic.  Right Ear: External ear normal.  Left Ear: External ear normal.  Eyes: Conjunctivae and EOM are normal. Pupils are equal, round, and reactive to light.  Neck: Normal range of motion and phonation normal. Neck supple.  Cardiovascular: Normal rate, regular  rhythm and normal heart sounds.   Pulmonary/Chest: Effort normal and breath sounds normal. He exhibits no bony tenderness.  Abdominal: Soft. There is no tenderness.  Musculoskeletal: Normal range of motion.  Neurological: He is alert and oriented to person, place, and time. No cranial nerve deficit or sensory deficit. He exhibits normal muscle tone. Coordination normal.  No dysarthria, aphasia or nystagmus. No pronator drift. No dysmetria. Normal gait.  Skin: Skin is warm, dry and intact.  Psychiatric: He has a normal mood and affect. His behavior is normal. Judgment and thought content normal.  Nursing note and vitals reviewed.    ED Treatments / Results  Labs (all labs ordered are listed, but only abnormal results are displayed) Labs Reviewed  ETHANOL - Abnormal; Notable for the following:       Result Value   Alcohol, Ethyl (B) 6 (*)    All other components within normal limits  COMPREHENSIVE METABOLIC PANEL - Abnormal; Notable for the following:    Glucose, Bld 101 (*)    All other components within normal limits  URINALYSIS, ROUTINE W REFLEX MICROSCOPIC (NOT AT [facility]) - Abnormal; Notable for the following:    Hgb urine dipstick LARGE (*)    All other components within normal limits  I-STAT CHEM 8, ED - Abnormal; Notable for the following:    Calcium, Ion 1.10 (*)    All other components within normal limits  PROTIME-INR  APTT  CBC  DIFFERENTIAL  RAPID URINE DRUG SCREEN, HOSP PERFORMED  URINE MICROSCOPIC-ADD ON  I-STAT TROPOININ, ED    EKG  EKG Interpretation  Date/Time:  Sunday October 27 2015 23:00:09 EDT Ventricular Rate:  58 PR Interval:    QRS Duration: 100 QT Interval:  459 QTC Calculation: 451 R Axis:   45 Text Interpretation:  Sinus rhythm Borderline T wave abnormalities Since last tracing T wave abnormality is new Confirmed by Effie Shy  MD, Lattie Cervi [phone]) on 10/27/2015 11:10:49 PM       Radiology Ct Head Wo Contrast  Result Date: 10/27/2015 CLINICAL  DATA:  70 year old male with headache and blurry vision. EXAM: CT HEAD WITHOUT CONTRAST TECHNIQUE: Contiguous axial images were obtained from the base of the skull through the vertex without intravenous contrast. COMPARISON:  None. FINDINGS: Brain: No evidence of acute infarction, hemorrhage, hydrocephalus, extra-axial collection or mass lesion/mass effect. Vascular: No hyperdense vessel or unexpected calcification. Skull: Normal. Negative for fracture or focal lesion. Sinuses/Orbits: No acute finding. Other: None IMPRESSION: No acute intracranial pathology. Electronically Signed   By: Elgie Collard M.D.   On: 10/27/2015 23:57    Procedures Procedures (including critical care time)  Medications Ordered in ED Medications - No data to display   Initial Impression / Assessment and Plan / ED Course  I have reviewed the triage vital signs and the nursing notes.  Pertinent labs &  imaging results that were available during my care of the patient were reviewed by me and considered in my medical decision making (see chart for details).  Clinical Course    Medications - No data to display  Patient Vitals for the past 24 hrs:  BP Temp Temp src Pulse Resp SpO2 Height Weight  10/28/15 0017 172/84 - - 61 19 96 % - -  10/28/15 0015 - 97.8 F (36.6 C) - - - - - -  10/28/15 0000 153/77 - - (!) 56 22 97 % - -  10/27/15 2300 150/79 - - (!) 56 16 96 % - -  10/27/15 2159 169/83 97.8 F (36.6 C) Oral (!) 59 17 97 % 5\' 8"  (1.727 m) 197 lb (89.4 kg)    00:25- case discussed with neural hospitalist, Dr. Roxy Mannsster, who [facility]  12:30 AM Reevaluation with update and discussion. After initial assessment and treatment, an updated evaluation reveals No change in clinical status. He remains comfortable. Visual acuities normal.. Jacqueli Pangallo L   12:32 AM-Consult complete with hospitalist. Patient case explained and discussed. He agrees to admit patient for further evaluation  and treatment. Call ended at 00:42  Final Clinical Impressions(s) / ED Diagnoses   Final diagnoses:  Transient cerebral ischemia, unspecified type  Hematuria, unspecified type    Evaluation consistent with TIA. Doubt primary eye disorder. He will require additional evaluation, with neurology consultation. He will need to be transferred to [place] for admission.  Nursing Notes Reviewed/ Care Coordinated, and agree without changes. Applicable Imaging Reviewed.  Interpretation of Laboratory Data incorporated into ED treatment   Plan: Admit  New Prescriptions New Prescriptions   No medications on file     Mancel BaleElliott Zalayah Pizzuto, MD 10/28/15 [phone]

## 2015-10-28 ENCOUNTER — Observation Stay (HOSPITAL_COMMUNITY): Payer: BC Managed Care – PPO

## 2015-10-28 ENCOUNTER — Inpatient Hospital Stay (HOSPITAL_COMMUNITY): Payer: BC Managed Care – PPO

## 2015-10-28 ENCOUNTER — Encounter (HOSPITAL_COMMUNITY): Payer: Self-pay | Admitting: Family Medicine

## 2015-10-28 DIAGNOSIS — I639 Cerebral infarction, unspecified: Secondary | ICD-10-CM | POA: Diagnosis present

## 2015-10-28 DIAGNOSIS — R319 Hematuria, unspecified: Secondary | ICD-10-CM | POA: Diagnosis present

## 2015-10-28 DIAGNOSIS — R29700 NIHSS score 0: Secondary | ICD-10-CM | POA: Diagnosis present

## 2015-10-28 DIAGNOSIS — I63432 Cerebral infarction due to embolism of left posterior cerebral artery: Secondary | ICD-10-CM | POA: Diagnosis present

## 2015-10-28 DIAGNOSIS — G459 Transient cerebral ischemic attack, unspecified: Secondary | ICD-10-CM | POA: Diagnosis present

## 2015-10-28 DIAGNOSIS — G458 Other transient cerebral ischemic attacks and related syndromes: Secondary | ICD-10-CM

## 2015-10-28 DIAGNOSIS — Q211 Atrial septal defect: Secondary | ICD-10-CM | POA: Diagnosis not present

## 2015-10-28 DIAGNOSIS — I10 Essential (primary) hypertension: Secondary | ICD-10-CM | POA: Diagnosis present

## 2015-10-28 DIAGNOSIS — Z888 Allergy status to other drugs, medicaments and biological substances status: Secondary | ICD-10-CM | POA: Diagnosis not present

## 2015-10-28 DIAGNOSIS — Z87442 Personal history of urinary calculi: Secondary | ICD-10-CM | POA: Diagnosis not present

## 2015-10-28 DIAGNOSIS — I351 Nonrheumatic aortic (valve) insufficiency: Secondary | ICD-10-CM | POA: Diagnosis not present

## 2015-10-28 DIAGNOSIS — I2510 Atherosclerotic heart disease of native coronary artery without angina pectoris: Secondary | ICD-10-CM | POA: Diagnosis present

## 2015-10-28 DIAGNOSIS — N400 Enlarged prostate without lower urinary tract symptoms: Secondary | ICD-10-CM | POA: Diagnosis present

## 2015-10-28 DIAGNOSIS — I480 Paroxysmal atrial fibrillation: Secondary | ICD-10-CM | POA: Diagnosis present

## 2015-10-28 DIAGNOSIS — H5347 Heteronymous bilateral field defects: Secondary | ICD-10-CM | POA: Diagnosis not present

## 2015-10-28 DIAGNOSIS — H5461 Unqualified visual loss, right eye, normal vision left eye: Secondary | ICD-10-CM | POA: Diagnosis present

## 2015-10-28 DIAGNOSIS — H538 Other visual disturbances: Secondary | ICD-10-CM | POA: Diagnosis present

## 2015-10-28 DIAGNOSIS — Z7982 Long term (current) use of aspirin: Secondary | ICD-10-CM | POA: Diagnosis not present

## 2015-10-28 DIAGNOSIS — E785 Hyperlipidemia, unspecified: Secondary | ICD-10-CM | POA: Diagnosis present

## 2015-10-28 DIAGNOSIS — Z23 Encounter for immunization: Secondary | ICD-10-CM | POA: Diagnosis not present

## 2015-10-28 DIAGNOSIS — I63532 Cerebral infarction due to unspecified occlusion or stenosis of left posterior cerebral artery: Secondary | ICD-10-CM | POA: Diagnosis not present

## 2015-10-28 LAB — LIPID PANEL
CHOL/HDL RATIO: 5.8 ratio
CHOLESTEROL: 184 mg/dL (ref 0–200)
HDL: 32 mg/dL — ABNORMAL LOW (ref >40)
LDL Cholesterol: 120 mg/dL — ABNORMAL HIGH (ref 0–99)
TRIGLYCERIDES: 160 mg/dL — AB (ref <150)
VLDL: 32 mg/dL (ref 0–40)

## 2015-10-28 MED ORDER — INFLUENZA VAC SPLIT QUAD 0.5 ML IM SUSY
0.5000 mL | PREFILLED_SYRINGE | INTRAMUSCULAR | Status: AC
  Administered 2015-10-29: 0.5 mL via INTRAMUSCULAR
  Filled 2015-10-28: qty 0.5

## 2015-10-28 MED ORDER — PRAVASTATIN SODIUM 20 MG PO TABS
20.0000 mg | ORAL_TABLET | Freq: Every day | ORAL | Status: DC
  Administered 2015-10-28: 20 mg via ORAL
  Filled 2015-10-28: qty 1

## 2015-10-28 MED ORDER — SENNOSIDES-DOCUSATE SODIUM 8.6-50 MG PO TABS: 1.0000 | ORAL_TABLET | Freq: Every evening | ORAL | Status: DC | PRN

## 2015-10-28 MED ORDER — ASPIRIN 300 MG RE SUPP: 300.0000 mg | Freq: Every day | RECTAL | Status: DC

## 2015-10-28 MED ORDER — IOPAMIDOL (ISOVUE-370) INJECTION 76%
INTRAVENOUS | Status: AC
  Administered 2015-10-28: 50 mL
  Filled 2015-10-28: qty 50

## 2015-10-28 MED ORDER — HYDROCODONE-ACETAMINOPHEN 5-325 MG PO TABS: 1.0000 | ORAL_TABLET | ORAL | Status: DC | PRN

## 2015-10-28 MED ORDER — OMEGA-3-ACID ETHYL ESTERS 1 G PO CAPS
1.0000 g | ORAL_CAPSULE | Freq: Every day | ORAL | Status: DC
  Administered 2015-10-28 – 2015-11-01 (×4): 1 g via ORAL
  Filled 2015-10-28 (×5): qty 1

## 2015-10-28 MED ORDER — ASPIRIN 325 MG PO TABS
325.0000 mg | ORAL_TABLET | Freq: Every day | ORAL | Status: DC
  Administered 2015-10-28 – 2015-11-01 (×4): 325 mg via ORAL
  Filled 2015-10-28 (×5): qty 1

## 2015-10-28 MED ORDER — LABETALOL HCL 5 MG/ML IV SOLN: 5.0000 mg | INTRAVENOUS | Status: DC | PRN

## 2015-10-28 MED ORDER — ENOXAPARIN SODIUM 40 MG/0.4ML ~~LOC~~ SOLN
40.0000 mg | SUBCUTANEOUS | Status: DC
  Administered 2015-10-28 – 2015-10-30 (×3): 40 mg via SUBCUTANEOUS
  Filled 2015-10-28 (×4): qty 0.4

## 2015-10-28 MED ORDER — ATENOLOL 25 MG PO TABS
25.0000 mg | ORAL_TABLET | Freq: Every day | ORAL | Status: DC
  Administered 2015-10-28 – 2015-10-30 (×3): 25 mg via ORAL
  Filled 2015-10-28 (×4): qty 1

## 2015-10-28 MED ORDER — STROKE: EARLY STAGES OF RECOVERY BOOK
Freq: Once | Status: DC
  Filled 2015-10-28: qty 1

## 2015-10-28 MED ORDER — ACETAMINOPHEN 325 MG PO TABS
650.0000 mg | ORAL_TABLET | Freq: Four times a day (QID) | ORAL | Status: DC | PRN
  Administered 2015-10-28: 650 mg via ORAL
  Filled 2015-10-28 (×2): qty 2

## 2015-10-28 MED ORDER — SODIUM CHLORIDE 0.9 % IV SOLN
INTRAVENOUS | Status: AC
Start: 2015-10-28 — End: 2015-10-28

## 2015-10-28 NOTE — Evaluation (Signed)
Physical Therapy Evaluation and Discharge Patient Details Name: Joel Payne MRN: [id number] DOB: Jan 31, 1945 Today's Date: 10/28/2015   History of Present Illness  Joel Payne is a 70 y.o. male referred for consult of TIA.PMHx of CAD, HLD, HTN, nephrolithiasis,.  Patient presented with a blurry right eye with localized headache in the left eye without weakness, numbness, paresthesias.. Pt with BP of 192/89.  Clinical Impression  Pt admitted with above. Appears all symptoms have resolved. Pt reports vision back to baseline. BP taken and MAP was 109 (192/89). Pt denies headache. Pt ambulating and transferring independently without difficulty. Pt with no further acute PT needs at this time. PT SIGNING OFF. Please re-consult if needed in the future.    Follow Up Recommendations No PT follow up    Equipment Recommendations  None recommended by PT    Recommendations for Other Services       Precautions / Restrictions Precautions Precautions: None Restrictions Weight Bearing Restrictions: No      Mobility  Bed Mobility Overal bed mobility: Independent             General bed mobility comments: no use of bed rail, bed flat  Transfers Overall transfer level: Independent Equipment used: None             General transfer comment: stable  Ambulation/Gait Ambulation/Gait assistance: Independent   Assistive device: Rolling walker (2 wheeled) Gait Pattern/deviations: WFL(Within Functional Limits);Step-through pattern Gait velocity: wfl Gait velocity interpretation: at or above normal speed for age/gender General Gait Details: no episodes of LOB  Stairs Stairs: Yes Stairs assistance: Modified independent (Device/Increase time) Stair Management: One rail Right Number of Stairs: 12 General stair comments: wfl  Wheelchair Mobility    Modified Rankin (Stroke Patients Only) Modified Rankin (Stroke Patients Only) Pre-Morbid Rankin Score: No symptoms Modified  Rankin: No significant disability     Balance                                 Standardized Balance Assessment Standardized Balance Assessment : Dynamic Gait Index   Dynamic Gait Index Level Surface: Normal Change in Gait Speed: Normal Gait with Horizontal Head Turns: Normal Gait with Vertical Head Turns: Normal Gait and Pivot Turn: Normal Step Over Obstacle: Normal Step Around Obstacles: Normal Steps: Mild Impairment Total Score: 23       Pertinent Vitals/Pain Pain Assessment: No/denies pain    Home Living Family/patient expects to be discharged to:: Private residence Living Arrangements: Spouse/significant other Available Help at Discharge: Family;Available 24 hours/day Type of Home: House Home Access: Stairs to enter Entrance Stairs-Rails: None Entrance Stairs-Number of Steps: 3 Home Layout: One level Home Equipment: None      Prior Function Level of Independence: Independent         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[employer], teaches anthropology     Higher education careers adviser        Extremity/Trunk Assessment   Upper Extremity Assessment: Overall WFL for tasks assessed           Lower Extremity Assessment: Overall WFL for tasks assessed      Cervical / Trunk Assessment: Normal  Communication   Communication: No difficulties  Cognition Arousal/Alertness: Awake/alert Behavior During Therapy: WFL for tasks assessed/performed Overall Cognitive Status: Within Functional Limits for tasks assessed                      General Comments General comments (skin  integrity, edema, etc.): in tact    Exercises     Assessment/Plan    PT Assessment Patent does not need any further PT services  PT Problem List            PT Treatment Interventions      PT Goals (Current goals can be found in the Care Plan section)  Acute Rehab PT Goals Patient Stated Goal: home PT Goal Formulation: All assessment and education complete, DC therapy     Frequency     Barriers to discharge        Co-evaluation               End of Session   Activity Tolerance: Patient tolerated treatment well Patient left: in bed (transporters present to take pt to MRI) Nurse Communication: Mobility status (pt indep and safe in room)    Functional Assessment Tool Used: clinical jdugement Functional Limitation: Mobility: Walking and moving around Mobility: Walking and Moving Around Current Status ([id number](G8978): At least 1 percent but less than 20 percent impaired, limited or restricted Mobility: Walking and Moving Around Goal Status [phone](G8979): 0 percent impaired, limited or restricted Mobility: Walking and Moving Around Discharge Status [phone](G8980): 0 percent impaired, limited or restricted    Time: 0750-0803 PT Time Calculation (min) (ACUTE ONLY): 13 min   Charges:   PT Evaluation $PT Eval Low Complexity: 1 Procedure     PT G Codes:   PT G-Codes **NOT FOR INPATIENT CLASS** Functional Assessment Tool Used: clinical jdugement Functional Limitation: Mobility: Walking and moving around Mobility: Walking and Moving Around Current Status ([id number](G8978): At least 1 percent but less than 20 percent impaired, limited or restricted Mobility: Walking and Moving Around Goal Status [phone](G8979): 0 percent impaired, limited or restricted Mobility: Walking and Moving Around Discharge Status [phone](G8980): 0 percent impaired, limited or restricted    Marcene BrawnChadwell, Gene Glazebrook Marie 10/28/2015, 8:43 AM   Lewis ShockAshly Chandani Rogowski, PT, DPT Pager #: [phone] Office #: [phone]

## 2015-10-28 NOTE — ED Notes (Signed)
MD at bedside. 

## 2015-10-28 NOTE — Evaluation (Signed)
Occupational Therapy Evaluation Patient Details Name: Joel Cannyrthur D Cryer MRN: [id number] DOB: Aug 28, 1945 Today's Date: 10/28/2015    History of Present Illness Joel Payne is a 70 y.o. male referred for consult of TIA.PMHx of CAD, HLD, HTN, nephrolithiasis,.  Patient presented with a blurry right eye with localized headache in the left eye without weakness, numbness, paresthesias.. Pt with BP of 192/89.   Clinical Impression   Pt demonstrating very high level balance with self care tasks and transfers. Pt ambulating without use of AD and no LOB noted. Pt ambulated up/down 8 steps and demonstrated step over simulated tub shower without issue. Pt does not appear to need any further OT follow up at this time. OT is signing off. Thank you for the referral.     Follow Up Recommendations  No OT follow up    Equipment Recommendations  None recommended by OT    Recommendations for Other Services       Precautions / Restrictions Precautions Precautions: None Restrictions Weight Bearing Restrictions: No      Mobility Bed Mobility Overal bed mobility: Independent    General bed mobility comments: no use of bed rail, bed flat  Transfers Overall transfer level: Independent Equipment used: None  General transfer comment: no LOB    Balance Overall balance assessment: Independent       ADL Overall ADL's : Independent       General ADL Comments: overall independence with all ADL tasks. Pt also demonstrating simulated step over tub ledge without difficulty.     Vision Vision Assessment?: No apparent visual deficits          Pertinent Vitals/Pain Pain Assessment: No/denies pain     Hand Dominance Right   Extremity/Trunk Assessment     Cervical / Trunk Assessment Cervical / Trunk Assessment: Normal   Communication Communication Communication: No difficulties   Cognition Arousal/Alertness: Awake/alert Behavior During Therapy: WFL for tasks  assessed/performed Overall Cognitive Status: Within Functional Limits for tasks assessed                 Home Living Family/patient expects to be discharged to:: Private residence Living Arrangements: Spouse/significant other Available Help at Discharge: Family;Available 24 hours/day Type of Home: House Home Access: Stairs to enter Enter[employer] CorporationEntrance Stairs-Number of Steps: 3 Entrance Stairs-Rails: None Home Layout: One level     Bathroom Shower/Tub: Tub/shower unit;Curtain Shower/tub characteristics: Engineer, building servicesCurtain Bathroom Toilet: Standard Bathroom Accessibility: Yes How Accessible: Accessible via walker Home Equipment: None          Prior Functioning/Environment Level of Independence: Independent        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[employer], teaches anthropology              OT Goals(Current goals can be found in the care plan section) Acute Rehab OT Goals Patient Stated Goal: home OT Goal Formulation: With patient Time For Goal Achievement: 11/11/15 Potential to Achieve Goals: Good   End of Session    Activity Tolerance: Patient tolerated treatment well Patient left: in bed;with bed alarm set;with call bell/phone within reach   Time: 1327-1336 OT Time Calculation (min): 9 min Charges:  OT General Charges $OT Visit: 1 Procedure OT Evaluation $OT Eval Low Complexity: 1 Procedure G-Codes: OT G-codes **NOT FOR INPATIENT CLASS** Functional Assessment Tool Used: clinical judgement Functional Limitation: Self care Self Care Current Status (J4782(G8987): At least 1 percent but less than 20 percent impaired, limited or restricted Self Care Goal Status (N5621(G8988): At least 1 percent but less than 20  percent impaired, limited or restricted Self Care Discharge Status [phone]): At least 1 percent but less than 20 percent impaired, limited or restricted  Lowella Grip 10/28/2015, 1:44 PM

## 2015-10-28 NOTE — Care Management Note (Signed)
Case Management Note  Patient Details  Name: Joel Payne MRN: [id number] Date of Birth: [birth year]  Subjective/Objective:    Pt admitted with CVA. He is from home with his spouse.                Action/Plan: No f/u per PT. Awaiting OT rec. CM following for discharge needs.  Expected Discharge Date:                  Expected Discharge Plan:  Home/Self Care  In-House Referral:     Discharge planning Services     Post Acute Care Choice:    Choice offered to:     DME Arranged:    DME Agency:     HH Arranged:    HH Agency:     Status of Service:  In process, will continue to follow  If discussed at Long Length of Stay Meetings, dates discussed:    Additional Comments:  Kermit BaloKelli F Yong Wahlquist, RN 10/28/2015, 1:27 PM

## 2015-10-28 NOTE — ED Notes (Signed)
Report called to 5W RN Elease HashimotoPatricia. [facility] called for transport.

## 2015-10-28 NOTE — Progress Notes (Signed)
Patient admitted after midnight, please see H&P.  MRI +, echo pending, FLP, HgbA1C pending.  Appreciate nephrology eval.   Suspect d/c in AM Marlin CanaryJessica Alanson Hausmann DO

## 2015-10-28 NOTE — Consult Note (Addendum)
[facility]    Provider:  Dr Lucia Gaskins Referring Provider: Marlin Canary, MD inpatient Primary Care Physician:  [name], MD  CC:  TIA  HPI:  Joel Payne is a 70 y.o. male referred for consult of TIA.PMHx of CAD, HLD, HTN, nephrolithiasis,.  Patient presented with a blurry right eye with localized headache in the left eye without weakness, numbness, paresthesias. Occurred 930 PM last evening (10/27/2015) with improvement. No Hx of migraines. He is on ASA 81mg  and endorses compliance. He lost vision in the right eye for about 40 minutes, just the right eye and just the right half. About 930 and then developed a left-sided headache. At the temple. No weakness, no sensory changes, no recent illnesses or new medications, not light headed or SOB or any other associated symptoms, no hx of stroke. Now he is completely fine. He is compliant with medications.    Date last known well: Last evening 930pm tPA Given: No tpa, deficits reslved  Review of Systems: Patient complains of symptoms per HPI as well as the following symptoms: No CP, No SOB. Pertinent negatives per HPI. All others negative.   Social History   Social History  . Marital status: Single    Spouse name: N/A  . Number of children: N/A  . Years of education: N/A   Occupational History  . Not on file.   Social History Main Topics  . Smoking status: Never Smoker  . Smokeless tobacco: Never Used  . Alcohol use 0.5 oz/week    1 drink(s) per week     Comment: occasionally  . Drug use: No  . Sexual activity: Not on file   Other Topics Concern  . Not on file   Social History Narrative  . No narrative on file    Family History  Problem Relation Age of Onset  . Colon cancer Neg Hx   . Esophageal cancer Neg Hx   . Rectal cancer Neg Hx   . Stomach cancer Neg Hx     Past Medical History:  Diagnosis Date  . ALLERGIC RHINITIS 11/08/2006  . BENIGN PROSTATIC HYPERTROPHY 10/12/2006  .  CORONARY ARTERY DISEASE 10/12/2006   Catheterization was normal 2012, mild nonobstructive coronary disease, normal LV function  . HYPERLIPIDEMIA 11/08/2006  . HYPERTENSION 10/12/2006  . Kidney stone    in 1980  . 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 Shortness of breath    April, 2012  /  catheterization May, 2012 mild nonobstructive coronary disease  . Urinary frequency 10/18/2009    Past Surgical History:  Procedure Laterality Date  . CARDIAC CATHETERIZATION  2012  . non radical prostatectomy  08/2011  . PROSTATE BIOPSY  2008    Current Facility-Administered Medications  Medication Dose Route Frequency Provider Last Rate Last Dose  .  stroke: mapping our early stages of recovery book   Does not apply Once Timothy S Opyd, MD      . 0.9 %  sodium chloride infusion   Intravenous Continuous Lavone Neri Opyd, MD      . acetaminophen (TYLENOL) tablet 650 mg  650 mg Oral Q6H PRN Lavone Neri Opyd, MD      . aspirin suppository 300 mg  300 mg Rectal Daily Briscoe Deutscher, MD       Or  . aspirin tablet 325 mg  325 mg Oral Daily Timothy S Opyd, MD      . enoxaparin (LOVENOX) injection 40 mg  40 mg  Subcutaneous Q24H Briscoe Deutscher, MD      . HYDROcodone-acetaminophen (NORCO/VICODIN) 5-325 MG per tablet 1-2 tablet  1-2 tablet Oral Q4H PRN Lavone Neri Opyd, MD      . labetalol (NORMODYNE,TRANDATE) injection 5 mg  5 mg Intravenous Q2H PRN Lavone Neri Opyd, MD      . omega-3 acid ethyl esters (LOVAZA) capsule 1 g  1 g Oral Daily Timothy S Opyd, MD      . pravastatin (PRAVACHOL) tablet 20 mg  20 mg Oral Daily Briscoe Deutscher, MD      . senna-docusate (Senokot-S) tablet 1 tablet  1 tablet Oral QHS PRN Briscoe Deutscher, MD        Allergies as of 10/27/2015 - Review Complete 10/27/2015  Allergen Reaction Noted  . Simvastatin Other (See Comments) 09/07/2011    Vitals: BP (!) 197/83 (BP Location: Left Arm)   Pulse (!) 52   Temp 97.8 F (36.6 C)   Resp 18   Ht 5\' 8"  (1.727 m)   Wt 84.1 kg  (185 lb 6.5 oz)   SpO2 96%   BMI 28.19 kg/m  Last Weight:  Wt Readings from Last 1 Encounters:  10/28/15 84.1 kg (185 lb 6.5 oz)   Last Height:   Ht Readings from Last 1 Encounters:  10/28/15 5\' 8"  (1.727 m)     Physical exam: Exam: Gen: NAD                    Physical Exam  Constitutional: Appears well-developed and well-nourished.  Psych: Affect appropriate to situation Eyes: No scleral injection HENT: No OP obstrucion Head: Normocephalic.  Cardiovascular: Normal rate and regular rhythm.  Respiratory: Effort normal and breath sounds normal to anterior ascultation GI: Soft.  No distension. There is no tenderness.  Skin: WDI    Neuro: Detailed Neurologic Exam  Speech:    Speech is normal; fluent and spontaneous with normal comprehension.  Cognition:    The patient is oriented to person, place, and time;     recent and remote memory intact;     language fluent;     normal attention, concentration,     fund of knowledge Cranial Nerves:    The pupils are equal, round, and reactive to light. The fundi are normal and spontaneous venous pulsations are present. Visual fields are full to finger confrontation. Extraocular movements are intact. Trigeminal sensation is intact and the muscles of mastication are normal. The face is symmetric. The palate elevates in the midline. Hearing intact. Voice is normal. Shoulder shrug is normal. The tongue has normal motion without fasciculations.   Coordination:    Normal finger to nose and heel to shin. Normal rapid alternating movements.   Gait:    Deferred  Motor Observation:    No asymmetry, no atrophy, and no involuntary movements noted. Tone:    Possibly increased tone in the lower extremities     Posture:    Posture is normal. normal erect    Strength:    Strength is V/V in the upper and lower limbs.      Sensation: intact to LT     Reflex Exam:  DTR's:    Deep tendon reflexes in the upper and lower extremities  birsk with crossed patellar bilaterally.   Toes:    The toes are equivocal bilaterally.   Clonus:    Clonus is absent.   Physical Examination: Blood pressure (!) 197/83, pulse (!) 52, temperature 97.8 F (36.6 C), resp.  rate 18, height 5\' 8"  (1.727 m), weight 84.1 kg (185 lb 6.5 oz), SpO2 96 %.  Laboratory Studies:  Basic Metabolic Panel:  Recent Labs Lab 10/27/15 2238 10/27/15 2249  NA 139 142  K 3.6 3.6  CL 105 102  CO2 25  --   GLUCOSE 101* 99  BUN 19 19  CREATININE 0.94 1.00  CALCIUM 9.1  --     Liver Function Tests:  Recent Labs Lab 10/27/15 2238  AST 20  ALT 21  ALKPHOS 47  BILITOT 1.0  PROT 7.3  ALBUMIN 4.4   No results for input(s): LIPASE, AMYLASE in the last 168 hours. No results for input(s): AMMONIA in the last 168 hours.  CBC:  Recent Labs Lab 10/27/15 2238 10/27/15 2249  WBC 6.5  --   NEUTROABS 3.3  --   HGB 15.5 15.0  HCT 43.0 44.0  MCV 83.7  --   PLT 185  --     Cardiac Enzymes: No results for input(s): CKTOTAL, CKMB, CKMBINDEX, TROPONINI in the last 168 hours.  BNP: Invalid input(s): POCBNP  CBG: No results for input(s): GLUCAP in the last 168 hours.  Microbiology: Results for orders placed or performed during the hospital encounter of 09/07/11  Urine culture     Status: None   Collection Time: 09/07/11 10:08 PM  Result Value Ref Range Status   Specimen Description URINE, CATHETERIZED  Final   Special Requests ADDED 2341  Final   Culture  Setup Time 09/08/2011 05:18  Final   Colony Count >=100,000 COLONIES/ML  Final   Culture ACINETOBACTER CALCOACETICUS/BAUMANNII COMPLEX  Final   Report Status 09/09/2011 FINAL  Final   Organism ID, Bacteria ACINETOBACTER CALCOACETICUS/BAUMANNII COMPLEX  Final      Susceptibility   Acinetobacter calcoaceticus/baumannii complex - MIC*    CEFTRIAXONE 16 Intermediate     CIPROFLOXACIN <=0.25 Sensitive     GENTAMICIN <=1 Sensitive     TOBRAMYCIN <=1 Sensitive     TRIMETH/SULFA <=20  Sensitive     LEVOFLOXACIN <=0.12 Sensitive     NITROFURANTOIN >=512 Resistant     * ACINETOBACTER CALCOACETICUS/BAUMANNII COMPLEX  MRSA PCR Screening     Status: None   Collection Time: 09/08/11  2:26 AM  Result Value Ref Range Status   MRSA by PCR NEGATIVE NEGATIVE Final    Comment:        The GeneXpert MRSA Assay (FDA approved for NASAL specimens only), is one component of a comprehensive MRSA colonization surveillance program. It is not intended to diagnose MRSA infection nor to guide or monitor treatment for MRSA infections.  Culture, blood (routine x 2)     Status: None   Collection Time: 09/08/11  9:52 AM  Result Value Ref Range Status   Specimen Description BLOOD RIGHT ARM  Final   Special Requests BOTTLES DRAWN AEROBIC AND ANAEROBIC 10CC  Final   Culture  Setup Time 09/08/2011 14:32  Final   Culture NO GROWTH 5 DAYS  Final   Report Status 09/14/2011 FINAL  Final  Culture, blood (routine x 2)     Status: None   Collection Time: 09/08/11 10:00 AM  Result Value Ref Range Status   Specimen Description BLOOD RIGHT HAND  Final   Special Requests BOTTLES DRAWN AEROBIC ONLY 10CC  Final   Culture  Setup Time 09/08/2011 14:32  Final   Culture NO GROWTH 5 DAYS  Final   Report Status 09/14/2011 FINAL  Final    Coagulation Studies:  Recent Labs  10/27/15  2238  LABPROT 13.5  INR 1.03    Urinalysis:  Recent Labs Lab 10/27/15 2322  COLORURINE YELLOW  LABSPEC 1.014  PHURINE 5.5  GLUCOSEU NEGATIVE  HGBUR LARGE*  BILIRUBINUR NEGATIVE  KETONESUR NEGATIVE  PROTEINUR NEGATIVE  NITRITE NEGATIVE  LEUKOCYTESUR NEGATIVE    Lipid Panel:    Component Value Date/Time   CHOL 214 (H) 09/10/2014 0821   TRIG 126.0 09/10/2014 0821   HDL 33.30 (L) 09/10/2014 0821   CHOLHDL 6 09/10/2014 0821   VLDL 25.2 09/10/2014 0821   LDLCALC 156 (H) 09/10/2014 0821    HgbA1C: No results found for: HGBA1C  Urine Drug Screen:     Component Value Date/Time   LABOPIA NONE DETECTED  10/27/2015 2322   COCAINSCRNUR NONE DETECTED 10/27/2015 2322   LABBENZ NONE DETECTED 10/27/2015 2322   AMPHETMU NONE DETECTED 10/27/2015 2322   THCU NONE DETECTED 10/27/2015 2322   LABBARB NONE DETECTED 10/27/2015 2322    Alcohol Level:  Recent Labs Lab 10/27/15 2238  ETH 6*    Other results:   Imaging: Dg Chest 2 View  Result Date: 10/28/2015 CLINICAL DATA:  70 y/o M; right eye blurriness and localize headache. EXAM: CHEST  2 VIEW COMPARISON:  09/07/2011 chest radiograph FINDINGS: The heart size and mediastinal contours are within normal limits and stable. Both lungs are clear. Mild degenerative changes of thoracic spine. IMPRESSION: No active cardiopulmonary disease. Electronically Signed   By: Mitzi HansenLance  Furusawa-Stratton M.D.   On: 10/28/2015 02:41   Ct Head Wo Contrast  Result Date: 10/27/2015 CLINICAL DATA:  70 year old male with headache and blurry vision. EXAM: CT HEAD WITHOUT CONTRAST TECHNIQUE: Contiguous axial images were obtained from the base of the skull through the vertex without intravenous contrast. COMPARISON:  None. FINDINGS: Brain: No evidence of acute infarction, hemorrhage, hydrocephalus, extra-axial collection or mass lesion/mass effect. Vascular: No hyperdense vessel or unexpected calcification. Skull: Normal. Negative for fracture or focal lesion. Sinuses/Orbits: No acute finding. Other: None IMPRESSION: No acute intracranial pathology. Electronically Signed   By: Elgie CollardArash  Radparvar M.D.   On: 10/27/2015 23:57    Assessment/Plan:    Assessment: 70 y.o. male with transient monocular vision loss. Neuro exam is non focal.   Stroke Risk Factors - hyperlipidemia, HTN, advanced age  Plan: 1. HgbA1c, fasting lipid panel (ordered) 2. MRI, MRA  of the brain without contrast 3. PT consult, OT consult (patient likely will not need PT or OT, exam non focal) 4. Echocardiogram  5. Carotid dopplers 6. Continue ASA 81mg  may add statin pending lipid panel 7. Telemetry  monitoring 8. 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 This was discussed with Dr. Pearlean BrownieSethi this evening. Spoke to patient and wife, reviewed MRIs.   I had a long d/w patient need to eval for stroke/TIAs, personally independently reviewed imaging studies and evaluation results and answered questions.Continue ASA for secondary stroke prevention and maintain strict control of hypertension with blood pressure goal below 130/90, diabetes with hemoglobin A1c goal below 6.5% and lipids with LDL cholesterol goal below 70 mg/dL. Need to check HgbA1c and lipid panel.   Naomie DeanAntonia Xavier Munger, MD  [facility] [address] [place], [place] [id number]  Phone [phone] Fax [phone]

## 2015-10-28 NOTE — H&P (Addendum)
History and Physical    BALTAZAR PEKALA WUJ:[id number] DOB: [birth year] DOA: 10/27/2015  PCP: [name], MD   Patient coming from: Home  Chief Complaint: Right-sided vision deficit, left frontal HA  HPI: Joel Payne is a 70 y.o. male with medical history significant for hypertension and hyperlipidemia who presents to the emergency department with right-sided vision loss and left frontal headache. Patient reports that he had been in his usual state of health throughout the day with no recent illness and no recent trauma. At approximately 9:45 PM on the day of his presentation, patient noted vision loss affecting the right half of his visual field. He isn't sure if the vision loss was in one eye or both. He was working at his home computer when he was suddenly unable to see the right half of the screen. Vision seemed to return to normal within 20-30 minutes, by which time he had developed a headache behind the left eye. There was no change in his hearing, no loss of coordination, no speech disturbance, and no focal numbness or weakness. Patient has never experienced anything similar previously. He is a nonsmoker without diabetes or significant vascular disease, though he is known to have hypertension and hyperlipidemia. Visual deficits had resolved prior to coming to the emergency department.  ED Course: Upon arrival to the ED, patient is found to be afebrile, saturating well on room air, bradycardic in the mid 50s, and with vitals otherwise stable. EKG demonstrates a sinus rhythm and noncontrast head CT is negative for acute intracranial abnormality. CMP is unremarkable and CBC is entirely within the normal limits. INR is also normal at 1.03, troponin is undetectable, UDS is completely negative, and urinalysis is notable only for large hemoglobin. Neurology was consulted by the ED physician and advised a medical admission to [facility] for ongoing evaluation and management of  suspected TIA or CVA.  Review of Systems:  All other systems reviewed and apart from HPI, are negative.  Past Medical History:  Diagnosis Date  . ALLERGIC RHINITIS 11/08/2006  . BENIGN PROSTATIC HYPERTROPHY 10/12/2006  . CORONARY ARTERY DISEASE 10/12/2006   Catheterization was normal 2012, mild nonobstructive coronary disease, normal LV function  . HYPERLIPIDEMIA 11/08/2006  . HYPERTENSION 10/12/2006  . Kidney stone    in 1980  . 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 Shortness of breath    April, 2012  /  catheterization May, 2012 mild nonobstructive coronary disease  . Urinary frequency 10/18/2009    Past Surgical History:  Procedure Laterality Date  . CARDIAC CATHETERIZATION  2012  . non radical prostatectomy  08/2011  . PROSTATE BIOPSY  2008     reports that he has never smoked. He has never used smokeless tobacco. He reports that he drinks about 0.5 oz of alcohol per week . He reports that he does not use drugs.  Allergies  Allergen Reactions  . Simvastatin Other (See Comments)    Leg cramps    Family History  Problem Relation Age of Onset  . Colon cancer Neg Hx   . Esophageal cancer Neg Hx   . Rectal cancer Neg Hx   . Stomach cancer Neg Hx      Prior to Admission medications   Medication Sig Start Date End Date Taking? Authorizing Provider  aspirin EC 81 MG tablet Take 81 mg by mouth daily.   Yes Historical Provider, MD  atenolol (TENORMIN) 25 MG tablet Take 25 mg by mouth daily.  Yes Historical Provider, MD  omega-3 acid ethyl esters (LOVAZA) 1 G capsule Take 1 g by mouth daily.   Yes Historical Provider, MD  pravastatin (PRAVACHOL) 20 MG tablet Take 1 tablet (20 mg total) by mouth daily. 09/18/14  Yes Gordy SaversPeter F Kwiatkowski, MD  atenolol (TENORMIN) 50 MG tablet TAKE 1 TABLET BY MOUTH EVERY DAY Patient not taking: Reported on 10/27/2015 09/05/15   Gordy SaversPeter F Kwiatkowski, MD    Physical Exam: Vitals:   10/27/15 2300 10/28/15 0000 10/28/15 0015 10/28/15  0017  BP: 150/79 153/77  172/84  Pulse: (!) 56 (!) 56  61  Resp: 16 22  19   Temp:   97.8 F (36.6 C)   TempSrc:      SpO2: 96% 97%  96%  Weight:      Height:          Constitutional: NAD, calm, comfortable Eyes: PERTLA, lids and conjunctivae normal ENMT: Mucous membranes are moist. Posterior pharynx clear of any exudate or lesions.   Neck: normal, supple, no masses, no thyromegaly Respiratory: clear to auscultation bilaterally, no wheezing, no crackles. Normal respiratory effort.    Cardiovascular: S1 & S2 heard, regular rate and rhythm, no significant murmur. No extremity edema. No carotid bruits. No significant JVD. Abdomen: No distension, no tenderness, no masses palpated. Bowel sounds normal.  Musculoskeletal: no clubbing / cyanosis. No joint deformity upper and lower extremities. Normal muscle tone.  Skin: no significant rashes, lesions, ulcers. Warm, dry, well-perfused. Neurologic: CN 2-12 grossly intact. Sensation intact, DTR normal. Strength 5/5 in all 4 limbs.  Psychiatric: Normal judgment and insight. Alert and oriented x 3. Normal mood and affect.     Labs on Admission: I have personally reviewed following labs and imaging studies  CBC:  Recent Labs Lab 10/27/15 2238 10/27/15 2249  WBC 6.5  --   NEUTROABS 3.3  --   HGB 15.5 15.0  HCT 43.0 44.0  MCV 83.7  --   PLT 185  --    Basic Metabolic Panel:  Recent Labs Lab 10/27/15 2238 10/27/15 2249  NA 139 142  K 3.6 3.6  CL 105 102  CO2 25  --   GLUCOSE 101* 99  BUN 19 19  CREATININE 0.94 1.00  CALCIUM 9.1  --    GFR: Estimated Creatinine Clearance: 74.7 mL/min (by C-G formula based on SCr of 1 mg/dL). Liver Function Tests:  Recent Labs Lab 10/27/15 2238  AST 20  ALT 21  ALKPHOS 47  BILITOT 1.0  PROT 7.3  ALBUMIN 4.4   No results for input(s): LIPASE, AMYLASE in the last 168 hours. No results for input(s): AMMONIA in the last 168 hours. Coagulation Profile:  Recent Labs Lab  10/27/15 2238  INR 1.03   Cardiac Enzymes: No results for input(s): CKTOTAL, CKMB, CKMBINDEX, TROPONINI in the last 168 hours. BNP (last 3 results) No results for input(s): PROBNP in the last 8760 hours. HbA1C: No results for input(s): HGBA1C in the last 72 hours. CBG: No results for input(s): GLUCAP in the last 168 hours. Lipid Profile: No results for input(s): CHOL, HDL, LDLCALC, TRIG, CHOLHDL, LDLDIRECT in the last 72 hours. Thyroid Function Tests: No results for input(s): TSH, T4TOTAL, FREET4, T3FREE, THYROIDAB in the last 72 hours. Anemia Panel: No results for input(s): VITAMINB12, FOLATE, FERRITIN, TIBC, IRON, RETICCTPCT in the last 72 hours. Urine analysis:    Component Value Date/Time   COLORURINE YELLOW 10/27/2015 2322   APPEARANCEUR CLEAR 10/27/2015 2322   LABSPEC 1.014 10/27/2015 2322  PHURINE 5.5 10/27/2015 2322   GLUCOSEU NEGATIVE 10/27/2015 2322   HGBUR LARGE (A) 10/27/2015 2322   HGBUR 3+ 11/26/2009 0814   BILIRUBINUR NEGATIVE 10/27/2015 2322   BILIRUBINUR Negative 09/10/2014 0945   KETONESUR NEGATIVE 10/27/2015 2322   PROTEINUR NEGATIVE 10/27/2015 2322   UROBILINOGEN 0.2 09/10/2014 0945   UROBILINOGEN 1.0 09/07/2011 2208   NITRITE NEGATIVE 10/27/2015 2322   LEUKOCYTESUR NEGATIVE 10/27/2015 2322   Sepsis Labs: @LABRCNTIP (procalcitonin:4,lacticidven:4) )No results found for this or any previous visit (from the past 240 hour(s)).   Radiological Exams on Admission: Ct Head Wo Contrast  Result Date: 10/27/2015 CLINICAL DATA:  70 year old male with headache and blurry vision. EXAM: CT HEAD WITHOUT CONTRAST TECHNIQUE: Contiguous axial images were obtained from the base of the skull through the vertex without intravenous contrast. COMPARISON:  None. FINDINGS: Brain: No evidence of acute infarction, hemorrhage, hydrocephalus, extra-axial collection or mass lesion/mass effect. Vascular: No hyperdense vessel or unexpected calcification. Skull: Normal. Negative for  fracture or focal lesion. Sinuses/Orbits: No acute finding. Other: None IMPRESSION: No acute intracranial pathology. Electronically Signed   By: Elgie Collard M.D.   On: 10/27/2015 23:57    EKG: Independently reviewed. Sinus rhythm.  Assessment/Plan  1. Transient right-sided hemianopsia  - Developed acute right-sided visual field deficit ~9:45 pm on 10/15, lasting 20-30 minutes and associated with left frontal HA  - RF's include age, HTN, HLD - CT head negative for acute intracranial abnormality, vitals normal and stable, and basic labwork including CMP, CBC, troponin, coags, UA, UDS is unremarkable  - tPA not considered as deficits have resolved by time of arrival  - ASA ppx started, will continue statin and increase as needed pending fasting lipid panel, VTE ppx  - Has already passed bedside swallow screen, will order diet  - Monitor on telemetry, check A1c and fasting lipids; obtain MRI/MRA brain, carotid dopplers, TTE  - PT, OT evals requested  - Maintain normothermia, euvolemia, euglycemia   2. Hypertension  - At goal currently  - Managed at home with atenolol; will hold for now given concern for acute CVA - Treat overnight for SBP >220, or DBP >110   3. Dyslipidemia  - LDL 156 and HDL 33 in August 2016 - Continue pravastatin and Lovaza at current dosing for now; he had intolerable leg cramping with simvastatin previously, unknown dose  - Fasting lipid panel ordered    4. Sinus bradycardia  - HR in mid-50's in ED, asymptomatic  - Likely secondary to atenolol, held on admission   - Monitoring on telemetry    5. Hematuria  - Large Hgb noted in admisison UA  - Likely secondary to BPH, but malignancy a consideration; outpatient follow-up advised    DVT prophylaxis: sq Lovenox Code Status: Full  Family Communication: Wife updated at bedside Disposition Plan: Admit to telemetry at [facility] Consults called: Neurology Admission status: Observation    Briscoe Deutscher, MD [facility] Pager [phone]  If 7PM-7AM, please contact night-coverage [email or web address] Password [email or web address]  10/28/2015, 1:07 AM

## 2015-10-29 ENCOUNTER — Inpatient Hospital Stay (HOSPITAL_COMMUNITY): Payer: BC Managed Care – PPO

## 2015-10-29 DIAGNOSIS — G459 Transient cerebral ischemic attack, unspecified: Secondary | ICD-10-CM

## 2015-10-29 DIAGNOSIS — E785 Hyperlipidemia, unspecified: Secondary | ICD-10-CM

## 2015-10-29 DIAGNOSIS — I480 Paroxysmal atrial fibrillation: Secondary | ICD-10-CM

## 2015-10-29 LAB — ECHOCARDIOGRAM COMPLETE
HEIGHTINCHES: 68 in
WEIGHTICAEL: 2966.51 [oz_av]

## 2015-10-29 LAB — HEMOGLOBIN A1C
Hgb A1c MFr Bld: 5.4 % (ref 4.8–5.6)
Mean Plasma Glucose: 108 mg/dL

## 2015-10-29 MED ORDER — PRAVASTATIN SODIUM 40 MG PO TABS
40.0000 mg | ORAL_TABLET | Freq: Every day | ORAL | Status: DC
  Administered 2015-10-29 – 2015-11-01 (×3): 40 mg via ORAL
  Filled 2015-10-29 (×4): qty 1

## 2015-10-29 NOTE — Progress Notes (Signed)
PROGRESS NOTE    LAWERENCE Payne  ZOX:[id number] DOB: 1945/09/26 DOA: 10/27/2015 PCP: [name], MD   Outpatient Specialists:     Brief Narrative:  Joel Payne is a 70 y.o. male with medical history significant for hypertension and hyperlipidemia who presents to the emergency department with right-sided vision loss and left frontal headache. Patient reports that he had been in his usual state of health throughout the day with no recent illness and no recent trauma. At approximately 9:45 PM on the day of his presentation, patient noted vision loss affecting the right half of his visual field. He isn't sure if the vision loss was in one eye or both. He was working at his home computer when he was suddenly unable to see the right half of the screen. Vision seemed to return to normal within 20-30 minutes, by which time he had developed a headache behind the left eye. There was no change in his hearing, no loss of coordination, no speech disturbance, and no focal numbness or weakness. Patient has never experienced anything similar previously. He is a nonsmoker without diabetes or significant vascular disease, though he is known to have hypertension and hyperlipidemia. Visual deficits had resolved prior to coming to the emergency department.   Assessment & Plan:   Active Problems:   Dyslipidemia   Essential hypertension   Hemianopsia   Acute CVA (cerebrovascular accident) (HCC)  CVA -needs TEE With loop recorder-- ? outpatient -ASA -statin HgbA1C 5.4   Hypertension  - At goal currently  - Managed at home with atenolol; will hold for now given concern for acute CVA - Treat overnight for SBP >220, or DBP >110    Sinus bradycardia  - HR in mid-50's in ED, asymptomatic  - Likely secondary to atenolol, held on admission   - Monitoring on telemetry    hemoglobin in urine -outpatient follow up   DVT prophylaxis:  Lovenox   Code Status: Full Code   Family  Communication:   Disposition Plan:     Consultants:   neuro    Subjective: Up making bed in room  Objective: Vitals:   10/29/15 0139 10/29/15 0521 10/29/15 1025 10/29/15 1316  BP: (!) 151/70 (!) 168/80 (!) 168/69 (!) 153/70  Pulse: (!) 58 (!) 59 (!) 51 (!) 51  Resp: 18 18 20 16   Temp: 97.9 F (36.6 C) 97.7 F (36.5 C) 97.6 F (36.4 C) 97.7 F (36.5 C)  TempSrc: Oral Oral Oral Oral  SpO2: 94% 97% 95% 97%  Weight:      Height:       No intake or output data in the 24 hours ending 10/29/15 1444 Filed Weights   10/27/15 2159 10/28/15 0620  Weight: 89.4 kg (197 lb) 84.1 kg (185 lb 6.5 oz)    Examination:  General exam: Appears calm and comfortable  Respiratory system: Clear to auscultation. Respiratory effort normal. Cardiovascular system: S1 & S2 heard, RRR. No JVD, murmurs, rubs, gallops or clicks. No pedal edema. Gastrointestinal system: Abdomen is nondistended, soft and nontender. No organomegaly or masses felt. Normal bowel sounds heard. Central nervous system: Alert and oriented. No focal neurological deficits.     Data Reviewed: I have personally reviewed following labs and imaging studies  CBC:  Recent Labs Lab 10/27/15 2238 10/27/15 2249  WBC 6.5  --   NEUTROABS 3.3  --   HGB 15.5 15.0  HCT 43.0 44.0  MCV 83.7  --   PLT 185  --  Basic Metabolic Panel:  Recent Labs Lab 10/27/15 2238 10/27/15 2249  NA 139 142  K 3.6 3.6  CL 105 102  CO2 25  --   GLUCOSE 101* 99  BUN 19 19  CREATININE 0.94 1.00  CALCIUM 9.1  --    GFR: Estimated Creatinine Clearance: 72.6 mL/min (by C-G formula based on SCr of 1 mg/dL). Liver Function Tests:  Recent Labs Lab 10/27/15 2238  AST 20  ALT 21  ALKPHOS 47  BILITOT 1.0  PROT 7.3  ALBUMIN 4.4   No results for input(s): LIPASE, AMYLASE in the last 168 hours. No results for input(s): AMMONIA in the last 168 hours. Coagulation Profile:  Recent Labs Lab 10/27/15 2238  INR 1.03   Cardiac  Enzymes: No results for input(s): CKTOTAL, CKMB, CKMBINDEX, TROPONINI in the last 168 hours. BNP (last 3 results) No results for input(s): PROBNP in the last 8760 hours. HbA1C:  Recent Labs  10/28/15 1024  HGBA1C 5.4   CBG: No results for input(s): GLUCAP in the last 168 hours. Lipid Profile:  Recent Labs  10/28/15 1024  CHOL 184  HDL 32*  LDLCALC 120*  TRIG 160*  CHOLHDL 5.8   Thyroid Function Tests: No results for input(s): TSH, T4TOTAL, FREET4, T3FREE, THYROIDAB in the last 72 hours. Anemia Panel: No results for input(s): VITAMINB12, FOLATE, FERRITIN, TIBC, IRON, RETICCTPCT in the last 72 hours. Urine analysis:    Component Value Date/Time   COLORURINE YELLOW 10/27/2015 2322   APPEARANCEUR CLEAR 10/27/2015 2322   LABSPEC 1.014 10/27/2015 2322   PHURINE 5.5 10/27/2015 2322   GLUCOSEU NEGATIVE 10/27/2015 2322   HGBUR LARGE (A) 10/27/2015 2322   HGBUR 3+ 11/26/2009 0814   BILIRUBINUR NEGATIVE 10/27/2015 2322   BILIRUBINUR Negative 09/10/2014 0945   KETONESUR NEGATIVE 10/27/2015 2322   PROTEINUR NEGATIVE 10/27/2015 2322   UROBILINOGEN 0.2 09/10/2014 0945   UROBILINOGEN 1.0 09/07/2011 2208   NITRITE NEGATIVE 10/27/2015 2322   LEUKOCYTESUR NEGATIVE 10/27/2015 2322    )No results found for this or any previous visit (from the past 240 hour(s)).    Anti-infectives    None       Radiology Studies: Ct Angio Head W Or Wo Contrast  Result Date: 10/28/2015 CLINICAL DATA:  Initial evaluation for acute stroke. Found have left PCA infarct on prior MRI. EXAM: CT ANGIOGRAPHY HEAD AND NECK TECHNIQUE: Multidetector CT imaging of the head and neck was performed using the standard protocol during bolus administration of intravenous contrast. Multiplanar CT image reconstructions and MIPs were obtained to evaluate the vascular anatomy. Carotid stenosis measurements (when applicable) are obtained utilizing NASCET criteria, using the distal internal carotid diameter as the  denominator. CONTRAST:  50 cc of Isovue 370. The CT could be obtained CTA chest 5 in MR the COMPARISON:  Previous MRI from earlier the same day. FINDINGS: CTA NECK FINDINGS Aortic arch: Visualized aortic arch of normal caliber with normal 3 vessel morphology. Mild scattered plaque within the proximal descending intrathoracic aorta and about the origin of the great vessels without high-grade stenosis. Visualized subclavian arteries are widely patent. Right carotid system: Right common carotid artery widely patent from its origin to the bifurcation. Mild eccentric calcified plaque about the right bifurcation without flow-limiting stenosis. Right ICA widely patent distally to the skullbase without stenosis, dissection, or occlusion. Left carotid system: Left common carotid artery patent from its origin to the bifurcation. Mild eccentric plaque about the left bifurcation (slightly worse on the right) without flow-limiting stenosis. Left ICA patent distally  to the skullbase without stenosis, dissection, or occlusion. Vertebral arteries: Both of the vertebral arteries arise from the subclavian arteries. Vertebral arteries patent within the neck without stenosis, dissection, or occlusion. Skeleton: No acute osseous abnormality. No worrisome lytic or blastic osseous lesions. Moderate multilevel degenerative spondylolysis noted within the visualized spine, greatest at C6-7. Other neck: Thyroid normal. No adenopathy within the neck. No acute soft tissue abnormality. Upper chest: Visualized mediastinum within normal limits. Visualized lungs are clear. Review of the MIP images confirms the above findings CTA HEAD FINDINGS Anterior circulation: Petrous, cavernous, and supraclinoid segments are widely patent bilaterally. ICA termini patent. A1 segments patent. Anterior communicating artery normal. Anterior cerebral arteries patent to their distal aspects and are relatively normal in appearance. M1 segments patent without  high-grade stenosis or occlusion. MCA bifurcations normal. No proximal M2 stenosis or occlusion. Distal MCA branches well opacified and symmetric bilaterally. Posterior circulation: Scattered atheromatous plaque within the V4 segments bilaterally with associated mild to moderate stenoses, slightly worse on the right (Series 502, image 246). Posterior inferior cerebral arteries patent bilaterally. Basilar artery widely patent to its distal aspect. Superior cerebral arteries patent bilaterally. Both of the posterior cerebral arteries primarily supplied via the basilar artery. Right PCA widely patent to its distal aspect without stenosis for significant atheromatous regularity. Left PCA patent proximally. Inferior left P3 segment attenuated and not well visualized distally, suspected to be possibly occluded given the acute left PCA infarct. Venous sinuses: Patent. Anatomic variants: No significant anatomic variant. No aneurysm or vascular malformation. Delayed phase: No pathologic enhancement. Review of the MIP images confirms the above findings IMPRESSION: CTA NECK IMPRESSION: 1. Mild eccentric plaque about the carotid bifurcations bilaterally without flow-limiting stenosis. No significant stenosis within the carotid arteries bilaterally. 2. Widely patent vertebral arteries within the neck. CTA HEAD IMPRESSION: 1. Attenuated and likely occluded left P3 segment, inferior division. 2. Focal atheromatous plaque within the V4 segments bilaterally with associated mild to moderate short-segment stenoses, slightly worse on the right. Otherwise widely patent posterior circulation. 3. Widely patent anterior circulation. Electronically Signed   By: Rise Mu M.D.   On: 10/28/2015 23:20   Dg Chest 2 View  Result Date: 10/28/2015 CLINICAL DATA:  70 y/o M; right eye blurriness and localize headache. EXAM: CHEST  2 VIEW COMPARISON:  09/07/2011 chest radiograph FINDINGS: The heart size and mediastinal contours are  within normal limits and stable. Both lungs are clear. Mild degenerative changes of thoracic spine. IMPRESSION: No active cardiopulmonary disease. Electronically Signed   By: Mitzi Hansen M.D.   On: 10/28/2015 02:41   Ct Head Wo Contrast  Result Date: 10/27/2015 CLINICAL DATA:  70 year old male with headache and blurry vision. EXAM: CT HEAD WITHOUT CONTRAST TECHNIQUE: Contiguous axial images were obtained from the base of the skull through the vertex without intravenous contrast. COMPARISON:  None. FINDINGS: Brain: No evidence of acute infarction, hemorrhage, hydrocephalus, extra-axial collection or mass lesion/mass effect. Vascular: No hyperdense vessel or unexpected calcification. Skull: Normal. Negative for fracture or focal lesion. Sinuses/Orbits: No acute finding. Other: None IMPRESSION: No acute intracranial pathology. Electronically Signed   By: Elgie Collard M.D.   On: 10/27/2015 23:57   Ct Angio Neck W Or Wo Contrast  Result Date: 10/28/2015 CLINICAL DATA:  Initial evaluation for acute stroke. Found have left PCA infarct on prior MRI. EXAM: CT ANGIOGRAPHY HEAD AND NECK TECHNIQUE: Multidetector CT imaging of the head and neck was performed using the standard protocol during bolus administration of intravenous contrast. Multiplanar CT  image reconstructions and MIPs were obtained to evaluate the vascular anatomy. Carotid stenosis measurements (when applicable) are obtained utilizing NASCET criteria, using the distal internal carotid diameter as the denominator. CONTRAST:  50 cc of Isovue 370. The CT could be obtained CTA chest 5 in MR the COMPARISON:  Previous MRI from earlier the same day. FINDINGS: CTA NECK FINDINGS Aortic arch: Visualized aortic arch of normal caliber with normal 3 vessel morphology. Mild scattered plaque within the proximal descending intrathoracic aorta and about the origin of the great vessels without high-grade stenosis. Visualized subclavian arteries are widely  patent. Right carotid system: Right common carotid artery widely patent from its origin to the bifurcation. Mild eccentric calcified plaque about the right bifurcation without flow-limiting stenosis. Right ICA widely patent distally to the skullbase without stenosis, dissection, or occlusion. Left carotid system: Left common carotid artery patent from its origin to the bifurcation. Mild eccentric plaque about the left bifurcation (slightly worse on the right) without flow-limiting stenosis. Left ICA patent distally to the skullbase without stenosis, dissection, or occlusion. Vertebral arteries: Both of the vertebral arteries arise from the subclavian arteries. Vertebral arteries patent within the neck without stenosis, dissection, or occlusion. Skeleton: No acute osseous abnormality. No worrisome lytic or blastic osseous lesions. Moderate multilevel degenerative spondylolysis noted within the visualized spine, greatest at C6-7. Other neck: Thyroid normal. No adenopathy within the neck. No acute soft tissue abnormality. Upper chest: Visualized mediastinum within normal limits. Visualized lungs are clear. Review of the MIP images confirms the above findings CTA HEAD FINDINGS Anterior circulation: Petrous, cavernous, and supraclinoid segments are widely patent bilaterally. ICA termini patent. A1 segments patent. Anterior communicating artery normal. Anterior cerebral arteries patent to their distal aspects and are relatively normal in appearance. M1 segments patent without high-grade stenosis or occlusion. MCA bifurcations normal. No proximal M2 stenosis or occlusion. Distal MCA branches well opacified and symmetric bilaterally. Posterior circulation: Scattered atheromatous plaque within the V4 segments bilaterally with associated mild to moderate stenoses, slightly worse on the right (Series 502, image 246). Posterior inferior cerebral arteries patent bilaterally. Basilar artery widely patent to its distal aspect.  Superior cerebral arteries patent bilaterally. Both of the posterior cerebral arteries primarily supplied via the basilar artery. Right PCA widely patent to its distal aspect without stenosis for significant atheromatous regularity. Left PCA patent proximally. Inferior left P3 segment attenuated and not well visualized distally, suspected to be possibly occluded given the acute left PCA infarct. Venous sinuses: Patent. Anatomic variants: No significant anatomic variant. No aneurysm or vascular malformation. Delayed phase: No pathologic enhancement. Review of the MIP images confirms the above findings IMPRESSION: CTA NECK IMPRESSION: 1. Mild eccentric plaque about the carotid bifurcations bilaterally without flow-limiting stenosis. No significant stenosis within the carotid arteries bilaterally. 2. Widely patent vertebral arteries within the neck. CTA HEAD IMPRESSION: 1. Attenuated and likely occluded left P3 segment, inferior division. 2. Focal atheromatous plaque within the V4 segments bilaterally with associated mild to moderate short-segment stenoses, slightly worse on the right. Otherwise widely patent posterior circulation. 3. Widely patent anterior circulation. Electronically Signed   By: Rise Mu M.D.   On: 10/28/2015 23:20   Mr Brain Wo Contrast  Result Date: 10/28/2015 CLINICAL DATA:  70 year old male with headache, with 40 minutes of transient right eye vision loss. Initial encounter. EXAM: MRI HEAD WITHOUT CONTRAST MRA HEAD WITHOUT CONTRAST TECHNIQUE: Multiplanar, multiecho pulse sequences of the brain and surrounding structures were obtained without intravenous contrast. Angiographic images of the head were obtained using  MRA technique without contrast. COMPARISON:  Head CT without contrast 10/27/2015. FINDINGS: MRI HEAD FINDINGS Brain: Patchy and confluent restricted diffusion affecting the medial inferior left occipital lobe along a distance of about 4.5-5 cm. This affects the  calcarine region. Associated mild T2 and FLAIR hyperintensity compatible with cytotoxic edema. No associated hemorrhage or mass effect. No other restricted diffusion. No midline shift, mass effect, evidence of mass lesion, ventriculomegaly, extra-axial collection or acute intracranial hemorrhage. Cervicomedullary junction and pituitary are within normal limits. Elsewhere essentially normal for age gray and white matter signal throughout the brain. Deep gray matter nuclei, the brainstem, and cerebellum appear normal. No chronic cerebral blood products identified. Vascular: Major intracranial vascular flow voids are preserved. Skull and upper cervical spine: Negative. Normal bone marrow signal. Sinuses/Orbits: Visualized paranasal sinuses and mastoids are stable and well pneumatized. Bilateral orbits soft tissues in the optic chiasm appear normal. Negative scalp soft tissues. Other: Visible internal auditory structures appear normal. MRA HEAD FINDINGS Antegrade flow in the posterior circulation. Visible distal vertebral arteries appear normal without stenosis, the right is mildly dominant. Left PICA origin is normal. Patent basilar artery without stenosis. Dominant appearing right AICA origin is normal. Normal SCA and PCA origins. Posterior communicating arteries are diminutive or absent. The right PCA is normal. The proximal left PCA is normal. Suggestion of stenosis versus occlusion of the inferior left P3 division as seen on series 655, image 3. Other left PCA branches appear normal. Antegrade flow in both ICA siphons. No siphon stenosis. Ophthalmic artery origins are normal. Patent carotid termini. Normal left MCA and ACA origins. Mild irregularity at the right ICA terminus affecting both the right ACA and MCA origin (series 653, image 7). Anterior communicating artery and visualized ACA branches are normal. Left MCA M1 segment, bifurcation, and visible left MCA branches are normal. Right MCA M1 segment,  trifurcation, and visible right MCA branches are within normal limits. IMPRESSION: 1. Positive for acute patchy small to moderate size left PCA infarct. Mild cytotoxic edema with no associated hemorrhage or mass effect. 2. Negative for emergent large vessel occlusion, but associated left inferior P3 branch thromboembolic stenosis or occlusion suspected. Otherwise normal left PCA and posterior circulation. 3. Mild atherosclerosis at the right ICA terminus affecting the right MCA and ACA origins without significant stenosis. Otherwise negative anterior circulation. 4. Otherwise negative for age noncontrast MRI appearance of the brain. Electronically Signed   By: Odessa Fleming M.D.   On: 10/28/2015 09:24   Mr Maxine Glenn Head/brain ZO Cm  Result Date: 10/28/2015 CLINICAL DATA:  70 year old male with headache, with 40 minutes of transient right eye vision loss. Initial encounter. EXAM: MRI HEAD WITHOUT CONTRAST MRA HEAD WITHOUT CONTRAST TECHNIQUE: Multiplanar, multiecho pulse sequences of the brain and surrounding structures were obtained without intravenous contrast. Angiographic images of the head were obtained using MRA technique without contrast. COMPARISON:  Head CT without contrast 10/27/2015. FINDINGS: MRI HEAD FINDINGS Brain: Patchy and confluent restricted diffusion affecting the medial inferior left occipital lobe along a distance of about 4.5-5 cm. This affects the calcarine region. Associated mild T2 and FLAIR hyperintensity compatible with cytotoxic edema. No associated hemorrhage or mass effect. No other restricted diffusion. No midline shift, mass effect, evidence of mass lesion, ventriculomegaly, extra-axial collection or acute intracranial hemorrhage. Cervicomedullary junction and pituitary are within normal limits. Elsewhere essentially normal for age gray and white matter signal throughout the brain. Deep gray matter nuclei, the brainstem, and cerebellum appear normal. No chronic cerebral blood products  identified. Vascular:  Major intracranial vascular flow voids are preserved. Skull and upper cervical spine: Negative. Normal bone marrow signal. Sinuses/Orbits: Visualized paranasal sinuses and mastoids are stable and well pneumatized. Bilateral orbits soft tissues in the optic chiasm appear normal. Negative scalp soft tissues. Other: Visible internal auditory structures appear normal. MRA HEAD FINDINGS Antegrade flow in the posterior circulation. Visible distal vertebral arteries appear normal without stenosis, the right is mildly dominant. Left PICA origin is normal. Patent basilar artery without stenosis. Dominant appearing right AICA origin is normal. Normal SCA and PCA origins. Posterior communicating arteries are diminutive or absent. The right PCA is normal. The proximal left PCA is normal. Suggestion of stenosis versus occlusion of the inferior left P3 division as seen on series 655, image 3. Other left PCA branches appear normal. Antegrade flow in both ICA siphons. No siphon stenosis. Ophthalmic artery origins are normal. Patent carotid termini. Normal left MCA and ACA origins. Mild irregularity at the right ICA terminus affecting both the right ACA and MCA origin (series 653, image 7). Anterior communicating artery and visualized ACA branches are normal. Left MCA M1 segment, bifurcation, and visible left MCA branches are normal. Right MCA M1 segment, trifurcation, and visible right MCA branches are within normal limits. IMPRESSION: 1. Positive for acute patchy small to moderate size left PCA infarct. Mild cytotoxic edema with no associated hemorrhage or mass effect. 2. Negative for emergent large vessel occlusion, but associated left inferior P3 branch thromboembolic stenosis or occlusion suspected. Otherwise normal left PCA and posterior circulation. 3. Mild atherosclerosis at the right ICA terminus affecting the right MCA and ACA origins without significant stenosis. Otherwise negative anterior  circulation. 4. Otherwise negative for age noncontrast MRI appearance of the brain. Electronically Signed   By: Odessa FlemingH  Hall M.D.   On: 10/28/2015 09:24        Scheduled Meds: .  stroke: mapping our early stages of recovery book   Does not apply Once  . aspirin  300 mg Rectal Daily   Or  . aspirin  325 mg Oral Daily  . atenolol  25 mg Oral Daily  . enoxaparin (LOVENOX) injection  40 mg Subcutaneous Q24H  . omega-3 acid ethyl esters  1 g Oral Daily  . pravastatin  40 mg Oral Daily   Continuous Infusions:    LOS: 1 day    Time spent: 25 min    Keagan Anthis Juanetta GoslingU Yoseline Andersson, DO [facility] Pager [phone]  If 7PM-7AM, please contact night-coverage [email or web address] Password [email or web address] 10/29/2015, 2:44 PM

## 2015-10-29 NOTE — Progress Notes (Signed)
STROKE TEAM PROGRESS NOTE   HISTORY OF PRESENT ILLNESS (per record) Joel Payne is a 70 y.o. male referred for consult of TIA.PMHx of CAD, HLD, HTN, nephrolithiasis,.  Patient presented with a blurry right eye with localized headache in the left eye without weakness, numbness, paresthesias. Occurred 930 PM last evening (10/27/2015) with improvement. No Hx of migraines. He is on ASA 81mg  and endorses compliance. He lost vision in the right eye for about 40 minutes, just the right eye and just the right half. About 930 and then developed a left-sided headache. At the temple. No weakness, no sensory changes, no recent illnesses or new medications, not light headed or SOB or any other associated symptoms, no hx of stroke. Now he is completely fine. He is compliant with medications.  Patient was last known well at 9:30 PM on 10/28/2015. Patient was not administered IV t-PA secondary to deficits resolved. He was admitted for further evaluation and treatment.   SUBJECTIVE (INTERVAL HISTORY) No one is at the bedside.  Overall he feels his condition is stable. He feels his his vision deficit is improving. He denies having difficulty remembering things. He could only recall 2/3 after 2 mins. Could name last 5 presidents. Patient works as a professor at [employer].   OBJECTIVE Temp:  [97.4 F (36.3 C)-98.8 F (37.1 C)] 97.6 F (36.4 C) (10/17 1025) Pulse Rate:  [51-65] 51 (10/17 1025) Cardiac Rhythm: Sinus bradycardia (10/17 0808) Resp:  [16-20] 20 (10/17 1025) BP: (151-185)/(69-93) 168/69 (10/17 1025) SpO2:  [94 %-98 %] 95 % (10/17 1025)  CBC:  Recent Labs Lab 10/27/15 2238 10/27/15 2249  WBC 6.5  --   NEUTROABS 3.3  --   HGB 15.5 15.0  HCT 43.0 44.0  MCV 83.7  --   PLT 185  --     Basic Metabolic Panel:  Recent Labs Lab 10/27/15 2238 10/27/15 2249  NA 139 142  K 3.6 3.6  CL 105 102  CO2 25  --   GLUCOSE 101* 99  BUN 19 19  CREATININE 0.94 1.00  CALCIUM 9.1  --     Lipid Panel:     Component Value Date/Time   CHOL 184 10/28/2015 1024   TRIG 160 (H) 10/28/2015 1024   HDL 32 (L) 10/28/2015 1024   CHOLHDL 5.8 10/28/2015 1024   VLDL 32 10/28/2015 1024   LDLCALC 120 (H) 10/28/2015 1024   HgbA1c:  Lab Results  Component Value Date   HGBA1C 5.4 10/28/2015   Urine Drug Screen:    Component Value Date/Time   LABOPIA NONE DETECTED 10/27/2015 2322   COCAINSCRNUR NONE DETECTED 10/27/2015 2322   LABBENZ NONE DETECTED 10/27/2015 2322   AMPHETMU NONE DETECTED 10/27/2015 2322   THCU NONE DETECTED 10/27/2015 2322   LABBARB NONE DETECTED 10/27/2015 2322      IMAGING  Ct Angio Head W Or Wo Contrast  Result Date: 10/28/2015 CLINICAL DATA:  Initial evaluation for acute stroke. Found have left PCA infarct on prior MRI. EXAM: CT ANGIOGRAPHY HEAD AND NECK TECHNIQUE: Multidetector CT imaging of the head and neck was performed using the standard protocol during bolus administration of intravenous contrast. Multiplanar CT image reconstructions and MIPs were obtained to evaluate the vascular anatomy. Carotid stenosis measurements (when applicable) are obtained utilizing NASCET criteria, using the distal internal carotid diameter as the denominator. CONTRAST:  50 cc of Isovue 370. The CT could be obtained CTA chest 5 in Joel the COMPARISON:  Previous MRI from earlier the same day.  FINDINGS: CTA NECK FINDINGS Aortic arch: Visualized aortic arch of normal caliber with normal 3 vessel morphology. Mild scattered plaque within the proximal descending intrathoracic aorta and about the origin of the great vessels without high-grade stenosis. Visualized subclavian arteries are widely patent. Right carotid system: Right common carotid artery widely patent from its origin to the bifurcation. Mild eccentric calcified plaque about the right bifurcation without flow-limiting stenosis. Right ICA widely patent distally to the skullbase without stenosis, dissection, or occlusion. Left carotid system: Left  common carotid artery patent from its origin to the bifurcation. Mild eccentric plaque about the left bifurcation (slightly worse on the right) without flow-limiting stenosis. Left ICA patent distally to the skullbase without stenosis, dissection, or occlusion. Vertebral arteries: Both of the vertebral arteries arise from the subclavian arteries. Vertebral arteries patent within the neck without stenosis, dissection, or occlusion. Skeleton: No acute osseous abnormality. No worrisome lytic or blastic osseous lesions. Moderate multilevel degenerative spondylolysis noted within the visualized spine, greatest at C6-7. Other neck: Thyroid normal. No adenopathy within the neck. No acute soft tissue abnormality. Upper chest: Visualized mediastinum within normal limits. Visualized lungs are clear. Review of the MIP images confirms the above findings CTA HEAD FINDINGS Anterior circulation: Petrous, cavernous, and supraclinoid segments are widely patent bilaterally. ICA termini patent. A1 segments patent. Anterior communicating artery normal. Anterior cerebral arteries patent to their distal aspects and are relatively normal in appearance. M1 segments patent without high-grade stenosis or occlusion. MCA bifurcations normal. No proximal M2 stenosis or occlusion. Distal MCA branches well opacified and symmetric bilaterally. Posterior circulation: Scattered atheromatous plaque within the V4 segments bilaterally with associated mild to moderate stenoses, slightly worse on the right (Series 502, image 246). Posterior inferior cerebral arteries patent bilaterally. Basilar artery widely patent to its distal aspect. Superior cerebral arteries patent bilaterally. Both of the posterior cerebral arteries primarily supplied via the basilar artery. Right PCA widely patent to its distal aspect without stenosis for significant atheromatous regularity. Left PCA patent proximally. Inferior left P3 segment attenuated and not well visualized  distally, suspected to be possibly occluded given the acute left PCA infarct. Venous sinuses: Patent. Anatomic variants: No significant anatomic variant. No aneurysm or vascular malformation. Delayed phase: No pathologic enhancement. Review of the MIP images confirms the above findings IMPRESSION: CTA NECK IMPRESSION: 1. Mild eccentric plaque about the carotid bifurcations bilaterally without flow-limiting stenosis. No significant stenosis within the carotid arteries bilaterally. 2. Widely patent vertebral arteries within the neck. CTA HEAD IMPRESSION: 1. Attenuated and likely occluded left P3 segment, inferior division. 2. Focal atheromatous plaque within the V4 segments bilaterally with associated mild to moderate short-segment stenoses, slightly worse on the right. Otherwise widely patent posterior circulation. 3. Widely patent anterior circulation. Electronically Signed   By: Rise Mu M.D.   On: 10/28/2015 23:20   Dg Chest 2 View  Result Date: 10/28/2015 CLINICAL DATA:  70 y/o M; right eye blurriness and localize headache. EXAM: CHEST  2 VIEW COMPARISON:  09/07/2011 chest radiograph FINDINGS: The heart size and mediastinal contours are within normal limits and stable. Both lungs are clear. Mild degenerative changes of thoracic spine. IMPRESSION: No active cardiopulmonary disease. Electronically Signed   By: Mitzi Hansen M.D.   On: 10/28/2015 02:41   Ct Head Wo Contrast  Result Date: 10/27/2015 CLINICAL DATA:  70 year old male with headache and blurry vision. EXAM: CT HEAD WITHOUT CONTRAST TECHNIQUE: Contiguous axial images were obtained from the base of the skull through the vertex without intravenous contrast. COMPARISON:  None. FINDINGS: Brain: No evidence of acute infarction, hemorrhage, hydrocephalus, extra-axial collection or mass lesion/mass effect. Vascular: No hyperdense vessel or unexpected calcification. Skull: Normal. Negative for fracture or focal lesion.  Sinuses/Orbits: No acute finding. Other: None IMPRESSION: No acute intracranial pathology. Electronically Signed   By: Elgie Collard M.D.   On: 10/27/2015 23:57   Ct Angio Neck W Or Wo Contrast  Result Date: 10/28/2015 CLINICAL DATA:  Initial evaluation for acute stroke. Found have left PCA infarct on prior MRI. EXAM: CT ANGIOGRAPHY HEAD AND NECK TECHNIQUE: Multidetector CT imaging of the head and neck was performed using the standard protocol during bolus administration of intravenous contrast. Multiplanar CT image reconstructions and MIPs were obtained to evaluate the vascular anatomy. Carotid stenosis measurements (when applicable) are obtained utilizing NASCET criteria, using the distal internal carotid diameter as the denominator. CONTRAST:  50 cc of Isovue 370. The CT could be obtained CTA chest 5 in Joel the COMPARISON:  Previous MRI from earlier the same day. FINDINGS: CTA NECK FINDINGS Aortic arch: Visualized aortic arch of normal caliber with normal 3 vessel morphology. Mild scattered plaque within the proximal descending intrathoracic aorta and about the origin of the great vessels without high-grade stenosis. Visualized subclavian arteries are widely patent. Right carotid system: Right common carotid artery widely patent from its origin to the bifurcation. Mild eccentric calcified plaque about the right bifurcation without flow-limiting stenosis. Right ICA widely patent distally to the skullbase without stenosis, dissection, or occlusion. Left carotid system: Left common carotid artery patent from its origin to the bifurcation. Mild eccentric plaque about the left bifurcation (slightly worse on the right) without flow-limiting stenosis. Left ICA patent distally to the skullbase without stenosis, dissection, or occlusion. Vertebral arteries: Both of the vertebral arteries arise from the subclavian arteries. Vertebral arteries patent within the neck without stenosis, dissection, or occlusion.  Skeleton: No acute osseous abnormality. No worrisome lytic or blastic osseous lesions. Moderate multilevel degenerative spondylolysis noted within the visualized spine, greatest at C6-7. Other neck: Thyroid normal. No adenopathy within the neck. No acute soft tissue abnormality. Upper chest: Visualized mediastinum within normal limits. Visualized lungs are clear. Review of the MIP images confirms the above findings CTA HEAD FINDINGS Anterior circulation: Petrous, cavernous, and supraclinoid segments are widely patent bilaterally. ICA termini patent. A1 segments patent. Anterior communicating artery normal. Anterior cerebral arteries patent to their distal aspects and are relatively normal in appearance. M1 segments patent without high-grade stenosis or occlusion. MCA bifurcations normal. No proximal M2 stenosis or occlusion. Distal MCA branches well opacified and symmetric bilaterally. Posterior circulation: Scattered atheromatous plaque within the V4 segments bilaterally with associated mild to moderate stenoses, slightly worse on the right (Series 502, image 246). Posterior inferior cerebral arteries patent bilaterally. Basilar artery widely patent to its distal aspect. Superior cerebral arteries patent bilaterally. Both of the posterior cerebral arteries primarily supplied via the basilar artery. Right PCA widely patent to its distal aspect without stenosis for significant atheromatous regularity. Left PCA patent proximally. Inferior left P3 segment attenuated and not well visualized distally, suspected to be possibly occluded given the acute left PCA infarct. Venous sinuses: Patent. Anatomic variants: No significant anatomic variant. No aneurysm or vascular malformation. Delayed phase: No pathologic enhancement. Review of the MIP images confirms the above findings IMPRESSION: CTA NECK IMPRESSION: 1. Mild eccentric plaque about the carotid bifurcations bilaterally without flow-limiting stenosis. No significant  stenosis within the carotid arteries bilaterally. 2. Widely patent vertebral arteries within the neck. CTA HEAD IMPRESSION: 1.  Attenuated and likely occluded left P3 segment, inferior division. 2. Focal atheromatous plaque within the V4 segments bilaterally with associated mild to moderate short-segment stenoses, slightly worse on the right. Otherwise widely patent posterior circulation. 3. Widely patent anterior circulation. Electronically Signed   By: Rise Mu M.D.   On: 10/28/2015 23:20   Joel Brain Wo Contrast  Result Date: 10/28/2015 CLINICAL DATA:  70 year old male with headache, with 40 minutes of transient right eye vision loss. Initial encounter. EXAM: MRI HEAD WITHOUT CONTRAST MRA HEAD WITHOUT CONTRAST TECHNIQUE: Multiplanar, multiecho pulse sequences of the brain and surrounding structures were obtained without intravenous contrast. Angiographic images of the head were obtained using MRA technique without contrast. COMPARISON:  Head CT without contrast 10/27/2015. FINDINGS: MRI HEAD FINDINGS Brain: Patchy and confluent restricted diffusion affecting the medial inferior left occipital lobe along a distance of about 4.5-5 cm. This affects the calcarine region. Associated mild T2 and FLAIR hyperintensity compatible with cytotoxic edema. No associated hemorrhage or mass effect. No other restricted diffusion. No midline shift, mass effect, evidence of mass lesion, ventriculomegaly, extra-axial collection or acute intracranial hemorrhage. Cervicomedullary junction and pituitary are within normal limits. Elsewhere essentially normal for age gray and white matter signal throughout the brain. Deep gray matter nuclei, the brainstem, and cerebellum appear normal. No chronic cerebral blood products identified. Vascular: Major intracranial vascular flow voids are preserved. Skull and upper cervical spine: Negative. Normal bone marrow signal. Sinuses/Orbits: Visualized paranasal sinuses and mastoids are  stable and well pneumatized. Bilateral orbits soft tissues in the optic chiasm appear normal. Negative scalp soft tissues. Other: Visible internal auditory structures appear normal. MRA HEAD FINDINGS Antegrade flow in the posterior circulation. Visible distal vertebral arteries appear normal without stenosis, the right is mildly dominant. Left PICA origin is normal. Patent basilar artery without stenosis. Dominant appearing right AICA origin is normal. Normal SCA and PCA origins. Posterior communicating arteries are diminutive or absent. The right PCA is normal. The proximal left PCA is normal. Suggestion of stenosis versus occlusion of the inferior left P3 division as seen on series 655, image 3. Other left PCA branches appear normal. Antegrade flow in both ICA siphons. No siphon stenosis. Ophthalmic artery origins are normal. Patent carotid termini. Normal left MCA and ACA origins. Mild irregularity at the right ICA terminus affecting both the right ACA and MCA origin (series 653, image 7). Anterior communicating artery and visualized ACA branches are normal. Left MCA M1 segment, bifurcation, and visible left MCA branches are normal. Right MCA M1 segment, trifurcation, and visible right MCA branches are within normal limits. IMPRESSION: 1. Positive for acute patchy small to moderate size left PCA infarct. Mild cytotoxic edema with no associated hemorrhage or mass effect. 2. Negative for emergent large vessel occlusion, but associated left inferior P3 branch thromboembolic stenosis or occlusion suspected. Otherwise normal left PCA and posterior circulation. 3. Mild atherosclerosis at the right ICA terminus affecting the right MCA and ACA origins without significant stenosis. Otherwise negative anterior circulation. 4. Otherwise negative for age noncontrast MRI appearance of the brain. Electronically Signed   By: Odessa Fleming M.D.   On: 10/28/2015 09:24   Joel Maxine Glenn Head/brain RU Cm  Result Date: 10/28/2015 CLINICAL  DATA:  70 year old male with headache, with 40 minutes of transient right eye vision loss. Initial encounter. EXAM: MRI HEAD WITHOUT CONTRAST MRA HEAD WITHOUT CONTRAST TECHNIQUE: Multiplanar, multiecho pulse sequences of the brain and surrounding structures were obtained without intravenous contrast. Angiographic images of the head were obtained using  MRA technique without contrast. COMPARISON:  Head CT without contrast 10/27/2015. FINDINGS: MRI HEAD FINDINGS Brain: Patchy and confluent restricted diffusion affecting the medial inferior left occipital lobe along a distance of about 4.5-5 cm. This affects the calcarine region. Associated mild T2 and FLAIR hyperintensity compatible with cytotoxic edema. No associated hemorrhage or mass effect. No other restricted diffusion. No midline shift, mass effect, evidence of mass lesion, ventriculomegaly, extra-axial collection or acute intracranial hemorrhage. Cervicomedullary junction and pituitary are within normal limits. Elsewhere essentially normal for age gray and white matter signal throughout the brain. Deep gray matter nuclei, the brainstem, and cerebellum appear normal. No chronic cerebral blood products identified. Vascular: Major intracranial vascular flow voids are preserved. Skull and upper cervical spine: Negative. Normal bone marrow signal. Sinuses/Orbits: Visualized paranasal sinuses and mastoids are stable and well pneumatized. Bilateral orbits soft tissues in the optic chiasm appear normal. Negative scalp soft tissues. Other: Visible internal auditory structures appear normal. MRA HEAD FINDINGS Antegrade flow in the posterior circulation. Visible distal vertebral arteries appear normal without stenosis, the right is mildly dominant. Left PICA origin is normal. Patent basilar artery without stenosis. Dominant appearing right AICA origin is normal. Normal SCA and PCA origins. Posterior communicating arteries are diminutive or absent. The right PCA is normal.  The proximal left PCA is normal. Suggestion of stenosis versus occlusion of the inferior left P3 division as seen on series 655, image 3. Other left PCA branches appear normal. Antegrade flow in both ICA siphons. No siphon stenosis. Ophthalmic artery origins are normal. Patent carotid termini. Normal left MCA and ACA origins. Mild irregularity at the right ICA terminus affecting both the right ACA and MCA origin (series 653, image 7). Anterior communicating artery and visualized ACA branches are normal. Left MCA M1 segment, bifurcation, and visible left MCA branches are normal. Right MCA M1 segment, trifurcation, and visible right MCA branches are within normal limits. IMPRESSION: 1. Positive for acute patchy small to moderate size left PCA infarct. Mild cytotoxic edema with no associated hemorrhage or mass effect. 2. Negative for emergent large vessel occlusion, but associated left inferior P3 branch thromboembolic stenosis or occlusion suspected. Otherwise normal left PCA and posterior circulation. 3. Mild atherosclerosis at the right ICA terminus affecting the right MCA and ACA origins without significant stenosis. Otherwise negative anterior circulation. 4. Otherwise negative for age noncontrast MRI appearance of the brain. Electronically Signed   By: Odessa FlemingH  Hall M.D.   On: 10/28/2015 09:24    PHYSICAL EXAM Pleasant elderly Caucasian male not in distress. . Afebrile. Head is nontraumatic. Neck is supple without bruit.    Cardiac exam no murmur or gallop. Lungs are clear to auscultation. Distal pulses are well felt. Neurological Exam ;  Awake  Alert oriented x 3. Normal speech and language.Diminished recall 2/3. Partial right peripheral vision loss mainly right temporal field.eye movements full without nystagmus.fundi were not visualized. Vision acuity and fields appear normal. Hearing is normal. Palatal movements are normal. Face symmetric. Tongue midline. Normal strength, tone, reflexes and coordination.  Normal sensation. Gait deferred.  ASSESSMENT/PLAN Joel. Windy Cannyrthur D Payne is a 70 y.o. male with history of coronary artery disease, hypertension, hyper lipidemia and nephrolithiasis presenting with blurry right eye and localized headache on the left. He did not receive IV t-PA due to deficits resolved.   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   MRI  left PCA infarct  MRA  left P3 stenosis, otherwise normal  CTA head and neck  Left  PCA occlusion, otherwise unremarkable  2D Echo  pending   TEE to look for embolic source. Arranged with [facility] for THURSDAY at 3 PM (scheduled tomorrow is full).  If positive for PFO (patent foramen ovale), check bilateral lower extremity venous dopplers to rule out DVT as possible source of stroke. (I have made patient NPO after midnight tonight).  If TEE negative, a [facility] electrophysiologist will consult and consider placement of an implantable loop recorder to evaluate for atrial fibrillation as etiology of stroke. This has been explained to patient/family by Dr. Pearlean Brownie and they are agreeable.   LDL 120  HgbA1c 5.4  Lovenox 40 mg sq daily for VTE prophylaxis  Diet Heart Room service appropriate? Yes; Fluid consistency: Thin  aspirin 81 mg daily prior to admission, now on aspirin 325 mg daily. Continue at discharge  Patient counseled to be compliant with his antithrombotic medications  Ongoing aggressive stroke risk factor management  Therapy recommendations:  No OT  Disposition:  Return home  Hypertension  Stable  Permissive hypertension (OK if < 220/120) but gradually normalize in 5-7 days  Long-term BP goal normotensive  Hyperlipidemia  Home meds:  Pravachol 20 and Lovaza, resumed in hospital  LDL 120, goal < 70  Continue statin at discharge  Other Stroke Risk Factors  Advanced age  ETOH use, advised to drink no more than 2 drink(s) a  day  Overweight, Body mass index is 28.19 kg/m., recommend weight loss, diet and exercise as appropriate   Other Active Problems  Sinus bradycardia  Hematuria  Hospital day # 1  BIBY,SHARON  [facility] See Amion for Pager information 10/29/2015 5:13 PM  I have personally examined this patient, reviewed notes, independently viewed imaging studies, participated in medical decision making and plan of care.ROS completed by me personally and pertinent positives fully documented  I have made any additions or clarifications directly to the above note. Agree with note above. He presented with right-sided vision loss due to embolic left posterior cerebral artery infarct etiology to be determined. He remains at risk for neurological worsening, recurrent stroke and TIAs. Recommend ongoing stroke evaluation and TEE and loop recorder insertion for paroxysmal atrial fibrillation. Start aspirin and statin. Greater than 50% time during this 35 minute visit was spent on counseling and coordination of care about stroke risk, prevention and treatment  Delia Heady, MD Medical Director [facility] Pager: [phone] 10/29/2015 5:30 PM  To contact Stroke Continuity provider, please refer to [email or web address]. After hours, contact General Neurology

## 2015-10-29 NOTE — Progress Notes (Signed)
  Echocardiogram 2D Echocardiogram has been performed.  Leta JunglingCooper, Joel Payne 10/29/2015, 10:13 AM

## 2015-10-30 DIAGNOSIS — I63532 Cerebral infarction due to unspecified occlusion or stenosis of left posterior cerebral artery: Secondary | ICD-10-CM

## 2015-10-30 DIAGNOSIS — R319 Hematuria, unspecified: Secondary | ICD-10-CM

## 2015-10-30 NOTE — Progress Notes (Signed)
PROGRESS NOTE    Joel Payne  ZOX:[id number] DOB: May 29, 1945 DOA: 10/27/2015 PCP: [name], MD   Outpatient Specialists:     Brief Narrative:  Joel Payne is a 70 y.o. male with medical history significant for hypertension and hyperlipidemia who presents to the emergency department with right-sided vision loss and left frontal headache. Patient reports that he had been in his usual state of health throughout the day with no recent illness and no recent trauma. At approximately 9:45 PM on the day of his presentation, patient noted vision loss affecting the right half of his visual field. He isn't sure if the vision loss was in one eye or both. He was working at his home computer when he was suddenly unable to see the right half of the screen. Vision seemed to return to normal within 20-30 minutes, by which time he had developed a headache behind the left eye. There was no change in his hearing, no loss of coordination, no speech disturbance, and no focal numbness or weakness. Patient has never experienced anything similar previously. He is a nonsmoker without diabetes or significant vascular disease, though he is known to have hypertension and hyperlipidemia. Visual deficits had resolved prior to coming to the emergency department.   Assessment & Plan:   Active Problems:   Dyslipidemia   Essential hypertension   Hemianopsia   Acute CVA (cerebrovascular accident) (HCC)  CVA -needs TEE With loop recorder-- to be done [employer] AT 3PM! -ASA -statin HgbA1C 5.4 -patient has NO symptoms  Hypertension  - At goal currently  - Managed at home with atenolol; will hold for now given concern for acute CVA - Treat overnight for SBP >220, or DBP >110   Sinus bradycardia  - HR in mid-50's in ED, asymptomatic  - Likely secondary to atenolol, held on admission   - Monitoring on telemetry    hemoglobin in urine -outpatient follow up   DVT prophylaxis:  Lovenox   Code  Status: Full Code   Family Communication: No family at bedside  Disposition Plan:     Consultants:   neuro    Subjective: On skype with colleague in [place] speaking in spanish  Objective: Vitals:   10/29/15 2145 10/30/15 0125 10/30/15 0541 10/30/15 1019  BP: (!) 163/80 (!) 169/75 (!) 176/87 (!) 155/77  Pulse: (!) 55 (!) 59 63 62  Resp: 18 18 18 17   Temp: 98.2 F (36.8 C) 98 F (36.7 C) 97.7 F (36.5 C) 98 F (36.7 C)  TempSrc: Oral Oral Oral Oral  SpO2: 100% 95% 98% 96%  Weight:      Height:        Intake/Output Summary (Last 24 hours) at 10/30/15 1256 Last data filed at 10/29/15 1727  Gross per 24 hour  Intake              480 ml  Output                0 ml  Net              480 ml   Filed Weights   10/27/15 2159 10/28/15 0620  Weight: 89.4 kg (197 lb) 84.1 kg (185 lb 6.5 oz)    Examination:  General exam: Appears calm and comfortable  Central nervous system: Alert and oriented. No focal neurological deficits.     Data Reviewed: I have personally reviewed following labs and imaging studies  CBC:  Recent Labs Lab 10/27/15 2238 10/27/15 2249  WBC 6.5  --   NEUTROABS 3.3  --   HGB 15.5 15.0  HCT 43.0 44.0  MCV 83.7  --   PLT 185  --    Basic Metabolic Panel:  Recent Labs Lab 10/27/15 2238 10/27/15 2249  NA 139 142  K 3.6 3.6  CL 105 102  CO2 25  --   GLUCOSE 101* 99  BUN 19 19  CREATININE 0.94 1.00  CALCIUM 9.1  --    GFR: Estimated Creatinine Clearance: 72.6 mL/min (by C-G formula based on SCr of 1 mg/dL). Liver Function Tests:  Recent Labs Lab 10/27/15 2238  AST 20  ALT 21  ALKPHOS 47  BILITOT 1.0  PROT 7.3  ALBUMIN 4.4   No results for input(s): LIPASE, AMYLASE in the last 168 hours. No results for input(s): AMMONIA in the last 168 hours. Coagulation Profile:  Recent Labs Lab 10/27/15 2238  INR 1.03   Cardiac Enzymes: No results for input(s): CKTOTAL, CKMB, CKMBINDEX, TROPONINI in the last 168  hours. BNP (last 3 results) No results for input(s): PROBNP in the last 8760 hours. HbA1C:  Recent Labs  10/28/15 1024  HGBA1C 5.4   CBG: No results for input(s): GLUCAP in the last 168 hours. Lipid Profile:  Recent Labs  10/28/15 1024  CHOL 184  HDL 32*  LDLCALC 120*  TRIG 160*  CHOLHDL 5.8   Thyroid Function Tests: No results for input(s): TSH, T4TOTAL, FREET4, T3FREE, THYROIDAB in the last 72 hours. Anemia Panel: No results for input(s): VITAMINB12, FOLATE, FERRITIN, TIBC, IRON, RETICCTPCT in the last 72 hours. Urine analysis:    Component Value Date/Time   COLORURINE YELLOW 10/27/2015 2322   APPEARANCEUR CLEAR 10/27/2015 2322   LABSPEC 1.014 10/27/2015 2322   PHURINE 5.5 10/27/2015 2322   GLUCOSEU NEGATIVE 10/27/2015 2322   HGBUR LARGE (A) 10/27/2015 2322   HGBUR 3+ 11/26/2009 0814   BILIRUBINUR NEGATIVE 10/27/2015 2322   BILIRUBINUR Negative 09/10/2014 0945   KETONESUR NEGATIVE 10/27/2015 2322   PROTEINUR NEGATIVE 10/27/2015 2322   UROBILINOGEN 0.2 09/10/2014 0945   UROBILINOGEN 1.0 09/07/2011 2208   NITRITE NEGATIVE 10/27/2015 2322   LEUKOCYTESUR NEGATIVE 10/27/2015 2322    )No results found for this or any previous visit (from the past 240 hour(s)).    Anti-infectives    None       Radiology Studies: Ct Angio Head W Or Wo Contrast  Result Date: 10/28/2015 CLINICAL DATA:  Initial evaluation for acute stroke. Found have left PCA infarct on prior MRI. EXAM: CT ANGIOGRAPHY HEAD AND NECK TECHNIQUE: Multidetector CT imaging of the head and neck was performed using the standard protocol during bolus administration of intravenous contrast. Multiplanar CT image reconstructions and MIPs were obtained to evaluate the vascular anatomy. Carotid stenosis measurements (when applicable) are obtained utilizing NASCET criteria, using the distal internal carotid diameter as the denominator. CONTRAST:  50 cc of Isovue 370. The CT could be obtained CTA chest 5 in MR the  COMPARISON:  Previous MRI from earlier the same day. FINDINGS: CTA NECK FINDINGS Aortic arch: Visualized aortic arch of normal caliber with normal 3 vessel morphology. Mild scattered plaque within the proximal descending intrathoracic aorta and about the origin of the great vessels without high-grade stenosis. Visualized subclavian arteries are widely patent. Right carotid system: Right common carotid artery widely patent from its origin to the bifurcation. Mild eccentric calcified plaque about the right bifurcation without flow-limiting stenosis. Right ICA widely patent distally to the skullbase without stenosis, dissection, or occlusion.  Left carotid system: Left common carotid artery patent from its origin to the bifurcation. Mild eccentric plaque about the left bifurcation (slightly worse on the right) without flow-limiting stenosis. Left ICA patent distally to the skullbase without stenosis, dissection, or occlusion. Vertebral arteries: Both of the vertebral arteries arise from the subclavian arteries. Vertebral arteries patent within the neck without stenosis, dissection, or occlusion. Skeleton: No acute osseous abnormality. No worrisome lytic or blastic osseous lesions. Moderate multilevel degenerative spondylolysis noted within the visualized spine, greatest at C6-7. Other neck: Thyroid normal. No adenopathy within the neck. No acute soft tissue abnormality. Upper chest: Visualized mediastinum within normal limits. Visualized lungs are clear. Review of the MIP images confirms the above findings CTA HEAD FINDINGS Anterior circulation: Petrous, cavernous, and supraclinoid segments are widely patent bilaterally. ICA termini patent. A1 segments patent. Anterior communicating artery normal. Anterior cerebral arteries patent to their distal aspects and are relatively normal in appearance. M1 segments patent without high-grade stenosis or occlusion. MCA bifurcations normal. No proximal M2 stenosis or occlusion.  Distal MCA branches well opacified and symmetric bilaterally. Posterior circulation: Scattered atheromatous plaque within the V4 segments bilaterally with associated mild to moderate stenoses, slightly worse on the right (Series 502, image 246). Posterior inferior cerebral arteries patent bilaterally. Basilar artery widely patent to its distal aspect. Superior cerebral arteries patent bilaterally. Both of the posterior cerebral arteries primarily supplied via the basilar artery. Right PCA widely patent to its distal aspect without stenosis for significant atheromatous regularity. Left PCA patent proximally. Inferior left P3 segment attenuated and not well visualized distally, suspected to be possibly occluded given the acute left PCA infarct. Venous sinuses: Patent. Anatomic variants: No significant anatomic variant. No aneurysm or vascular malformation. Delayed phase: No pathologic enhancement. Review of the MIP images confirms the above findings IMPRESSION: CTA NECK IMPRESSION: 1. Mild eccentric plaque about the carotid bifurcations bilaterally without flow-limiting stenosis. No significant stenosis within the carotid arteries bilaterally. 2. Widely patent vertebral arteries within the neck. CTA HEAD IMPRESSION: 1. Attenuated and likely occluded left P3 segment, inferior division. 2. Focal atheromatous plaque within the V4 segments bilaterally with associated mild to moderate short-segment stenoses, slightly worse on the right. Otherwise widely patent posterior circulation. 3. Widely patent anterior circulation. Electronically Signed   By: Rise MuBenjamin  McClintock M.D.   On: 10/28/2015 23:20   Ct Angio Neck W Or Wo Contrast  Result Date: 10/28/2015 CLINICAL DATA:  Initial evaluation for acute stroke. Found have left PCA infarct on prior MRI. EXAM: CT ANGIOGRAPHY HEAD AND NECK TECHNIQUE: Multidetector CT imaging of the head and neck was performed using the standard protocol during bolus administration of  intravenous contrast. Multiplanar CT image reconstructions and MIPs were obtained to evaluate the vascular anatomy. Carotid stenosis measurements (when applicable) are obtained utilizing NASCET criteria, using the distal internal carotid diameter as the denominator. CONTRAST:  50 cc of Isovue 370. The CT could be obtained CTA chest 5 in MR the COMPARISON:  Previous MRI from earlier the same day. FINDINGS: CTA NECK FINDINGS Aortic arch: Visualized aortic arch of normal caliber with normal 3 vessel morphology. Mild scattered plaque within the proximal descending intrathoracic aorta and about the origin of the great vessels without high-grade stenosis. Visualized subclavian arteries are widely patent. Right carotid system: Right common carotid artery widely patent from its origin to the bifurcation. Mild eccentric calcified plaque about the right bifurcation without flow-limiting stenosis. Right ICA widely patent distally to the skullbase without stenosis, dissection, or occlusion. Left carotid system:  Left common carotid artery patent from its origin to the bifurcation. Mild eccentric plaque about the left bifurcation (slightly worse on the right) without flow-limiting stenosis. Left ICA patent distally to the skullbase without stenosis, dissection, or occlusion. Vertebral arteries: Both of the vertebral arteries arise from the subclavian arteries. Vertebral arteries patent within the neck without stenosis, dissection, or occlusion. Skeleton: No acute osseous abnormality. No worrisome lytic or blastic osseous lesions. Moderate multilevel degenerative spondylolysis noted within the visualized spine, greatest at C6-7. Other neck: Thyroid normal. No adenopathy within the neck. No acute soft tissue abnormality. Upper chest: Visualized mediastinum within normal limits. Visualized lungs are clear. Review of the MIP images confirms the above findings CTA HEAD FINDINGS Anterior circulation: Petrous, cavernous, and  supraclinoid segments are widely patent bilaterally. ICA termini patent. A1 segments patent. Anterior communicating artery normal. Anterior cerebral arteries patent to their distal aspects and are relatively normal in appearance. M1 segments patent without high-grade stenosis or occlusion. MCA bifurcations normal. No proximal M2 stenosis or occlusion. Distal MCA branches well opacified and symmetric bilaterally. Posterior circulation: Scattered atheromatous plaque within the V4 segments bilaterally with associated mild to moderate stenoses, slightly worse on the right (Series 502, image 246). Posterior inferior cerebral arteries patent bilaterally. Basilar artery widely patent to its distal aspect. Superior cerebral arteries patent bilaterally. Both of the posterior cerebral arteries primarily supplied via the basilar artery. Right PCA widely patent to its distal aspect without stenosis for significant atheromatous regularity. Left PCA patent proximally. Inferior left P3 segment attenuated and not well visualized distally, suspected to be possibly occluded given the acute left PCA infarct. Venous sinuses: Patent. Anatomic variants: No significant anatomic variant. No aneurysm or vascular malformation. Delayed phase: No pathologic enhancement. Review of the MIP images confirms the above findings IMPRESSION: CTA NECK IMPRESSION: 1. Mild eccentric plaque about the carotid bifurcations bilaterally without flow-limiting stenosis. No significant stenosis within the carotid arteries bilaterally. 2. Widely patent vertebral arteries within the neck. CTA HEAD IMPRESSION: 1. Attenuated and likely occluded left P3 segment, inferior division. 2. Focal atheromatous plaque within the V4 segments bilaterally with associated mild to moderate short-segment stenoses, slightly worse on the right. Otherwise widely patent posterior circulation. 3. Widely patent anterior circulation. Electronically Signed   By: Rise Mu M.D.    On: 10/28/2015 23:20        Scheduled Meds: .  stroke: mapping our early stages of recovery book   Does not apply Once  . aspirin  300 mg Rectal Daily   Or  . aspirin  325 mg Oral Daily  . atenolol  25 mg Oral Daily  . enoxaparin (LOVENOX) injection  40 mg Subcutaneous Q24H  . omega-3 acid ethyl esters  1 g Oral Daily  . pravastatin  40 mg Oral Daily   Continuous Infusions:    LOS: 2 days    Time spent: 15 min    Winter Trefz Juanetta Gosling, DO [facility] Pager [phone]  If 7PM-7AM, please contact night-coverage [email or web address] Password [email or web address] 10/30/2015, 12:56 PM

## 2015-10-30 NOTE — Progress Notes (Signed)
    [facility] has been requested to perform a transesophageal echocardiogram on Windy Cannyrthur D Wafer for cerebrovascular accident.  After careful review of history and examination, the risks and benefits of transesophageal echocardiogram have been explained including risks of esophageal damage, perforation (1:10,000 risk), bleeding, pharyngeal hematoma as well as other potential complications associated with conscious sedation including aspiration, arrhythmia, respiratory failure and death. Alternatives to treatment were discussed, questions were answered. Patient is willing to proceed.  Hgb stable at 15.0, platelets 185. BP stable without requirement for pressors.  Scheduled for 10/31/2015 at 1500 with Dr. Eden EmmsNishan.  Ellsworth LennoxBrittany M Davionte Lusby, [place]  10/30/2015 2:58 PM

## 2015-10-30 NOTE — Progress Notes (Signed)
STROKE TEAM PROGRESS NOTE   SUBJECTIVE (INTERVAL HISTORY) Patient up in chair. No new complaints. Feels he is back to his baseline wtihout deficits.    OBJECTIVE Temp:  [97.6 F (36.4 C)-98.2 F (36.8 C)] 97.7 F (36.5 C) (10/18 0541) Pulse Rate:  [51-63] 63 (10/18 0541) Cardiac Rhythm: Sinus bradycardia (10/17 1900) Resp:  [16-20] 18 (10/18 0541) BP: (153-176)/(63-87) 176/87 (10/18 0541) SpO2:  [94 %-100 %] 98 % (10/18 0541)  CBC:   Recent Labs Lab 10/27/15 2238 10/27/15 2249  WBC 6.5  --   NEUTROABS 3.3  --   HGB 15.5 15.0  HCT 43.0 44.0  MCV 83.7  --   PLT 185  --     Basic Metabolic Panel:   Recent Labs Lab 10/27/15 2238 10/27/15 2249  NA 139 142  K 3.6 3.6  CL 105 102  CO2 25  --   GLUCOSE 101* 99  BUN 19 19  CREATININE 0.94 1.00  CALCIUM 9.1  --     Lipid Panel:     Component Value Date/Time   CHOL 184 10/28/2015 1024   TRIG 160 (H) 10/28/2015 1024   HDL 32 (L) 10/28/2015 1024   CHOLHDL 5.8 10/28/2015 1024   VLDL 32 10/28/2015 1024   LDLCALC 120 (H) 10/28/2015 1024   HgbA1c:  Lab Results  Component Value Date   HGBA1C 5.4 10/28/2015   Urine Drug Screen:     Component Value Date/Time   LABOPIA NONE DETECTED 10/27/2015 2322   COCAINSCRNUR NONE DETECTED 10/27/2015 2322   LABBENZ NONE DETECTED 10/27/2015 2322   AMPHETMU NONE DETECTED 10/27/2015 2322   THCU NONE DETECTED 10/27/2015 2322   LABBARB NONE DETECTED 10/27/2015 2322      IMAGING  Ct Angio Head W Or Wo Contrast  Result Date: 10/28/2015 CLINICAL DATA:  Initial evaluation for acute stroke. Found have left PCA infarct on prior MRI. EXAM: CT ANGIOGRAPHY HEAD AND NECK TECHNIQUE: Multidetector CT imaging of the head and neck was performed using the standard protocol during bolus administration of intravenous contrast. Multiplanar CT image reconstructions and MIPs were obtained to evaluate the vascular anatomy. Carotid stenosis measurements (when applicable) are obtained utilizing  NASCET criteria, using the distal internal carotid diameter as the denominator. CONTRAST:  50 cc of Isovue 370. The CT could be obtained CTA chest 5 in MR the COMPARISON:  Previous MRI from earlier the same day. FINDINGS: CTA NECK FINDINGS Aortic arch: Visualized aortic arch of normal caliber with normal 3 vessel morphology. Mild scattered plaque within the proximal descending intrathoracic aorta and about the origin of the great vessels without high-grade stenosis. Visualized subclavian arteries are widely patent. Right carotid system: Right common carotid artery widely patent from its origin to the bifurcation. Mild eccentric calcified plaque about the right bifurcation without flow-limiting stenosis. Right ICA widely patent distally to the skullbase without stenosis, dissection, or occlusion. Left carotid system: Left common carotid artery patent from its origin to the bifurcation. Mild eccentric plaque about the left bifurcation (slightly worse on the right) without flow-limiting stenosis. Left ICA patent distally to the skullbase without stenosis, dissection, or occlusion. Vertebral arteries: Both of the vertebral arteries arise from the subclavian arteries. Vertebral arteries patent within the neck without stenosis, dissection, or occlusion. Skeleton: No acute osseous abnormality. No worrisome lytic or blastic osseous lesions. Moderate multilevel degenerative spondylolysis noted within the visualized spine, greatest at C6-7. Other neck: Thyroid normal. No adenopathy within the neck. No acute soft tissue abnormality. Upper chest: Visualized mediastinum  within normal limits. Visualized lungs are clear. Review of the MIP images confirms the above findings CTA HEAD FINDINGS Anterior circulation: Petrous, cavernous, and supraclinoid segments are widely patent bilaterally. ICA termini patent. A1 segments patent. Anterior communicating artery normal. Anterior cerebral arteries patent to their distal aspects and are  relatively normal in appearance. M1 segments patent without high-grade stenosis or occlusion. MCA bifurcations normal. No proximal M2 stenosis or occlusion. Distal MCA branches well opacified and symmetric bilaterally. Posterior circulation: Scattered atheromatous plaque within the V4 segments bilaterally with associated mild to moderate stenoses, slightly worse on the right (Series 502, image 246). Posterior inferior cerebral arteries patent bilaterally. Basilar artery widely patent to its distal aspect. Superior cerebral arteries patent bilaterally. Both of the posterior cerebral arteries primarily supplied via the basilar artery. Right PCA widely patent to its distal aspect without stenosis for significant atheromatous regularity. Left PCA patent proximally. Inferior left P3 segment attenuated and not well visualized distally, suspected to be possibly occluded given the acute left PCA infarct. Venous sinuses: Patent. Anatomic variants: No significant anatomic variant. No aneurysm or vascular malformation. Delayed phase: No pathologic enhancement. Review of the MIP images confirms the above findings IMPRESSION: CTA NECK IMPRESSION: 1. Mild eccentric plaque about the carotid bifurcations bilaterally without flow-limiting stenosis. No significant stenosis within the carotid arteries bilaterally. 2. Widely patent vertebral arteries within the neck. CTA HEAD IMPRESSION: 1. Attenuated and likely occluded left P3 segment, inferior division. 2. Focal atheromatous plaque within the V4 segments bilaterally with associated mild to moderate short-segment stenoses, slightly worse on the right. Otherwise widely patent posterior circulation. 3. Widely patent anterior circulation. Electronically Signed   By: Rise MuBenjamin  McClintock M.D.   On: 10/28/2015 23:20   Ct Angio Neck W Or Wo Contrast  Result Date: 10/28/2015 CLINICAL DATA:  Initial evaluation for acute stroke. Found have left PCA infarct on prior MRI. EXAM: CT  ANGIOGRAPHY HEAD AND NECK TECHNIQUE: Multidetector CT imaging of the head and neck was performed using the standard protocol during bolus administration of intravenous contrast. Multiplanar CT image reconstructions and MIPs were obtained to evaluate the vascular anatomy. Carotid stenosis measurements (when applicable) are obtained utilizing NASCET criteria, using the distal internal carotid diameter as the denominator. CONTRAST:  50 cc of Isovue 370. The CT could be obtained CTA chest 5 in MR the COMPARISON:  Previous MRI from earlier the same day. FINDINGS: CTA NECK FINDINGS Aortic arch: Visualized aortic arch of normal caliber with normal 3 vessel morphology. Mild scattered plaque within the proximal descending intrathoracic aorta and about the origin of the great vessels without high-grade stenosis. Visualized subclavian arteries are widely patent. Right carotid system: Right common carotid artery widely patent from its origin to the bifurcation. Mild eccentric calcified plaque about the right bifurcation without flow-limiting stenosis. Right ICA widely patent distally to the skullbase without stenosis, dissection, or occlusion. Left carotid system: Left common carotid artery patent from its origin to the bifurcation. Mild eccentric plaque about the left bifurcation (slightly worse on the right) without flow-limiting stenosis. Left ICA patent distally to the skullbase without stenosis, dissection, or occlusion. Vertebral arteries: Both of the vertebral arteries arise from the subclavian arteries. Vertebral arteries patent within the neck without stenosis, dissection, or occlusion. Skeleton: No acute osseous abnormality. No worrisome lytic or blastic osseous lesions. Moderate multilevel degenerative spondylolysis noted within the visualized spine, greatest at C6-7. Other neck: Thyroid normal. No adenopathy within the neck. No acute soft tissue abnormality. Upper chest: Visualized mediastinum within normal limits.  Visualized lungs are clear. Review of the MIP images confirms the above findings CTA HEAD FINDINGS Anterior circulation: Petrous, cavernous, and supraclinoid segments are widely patent bilaterally. ICA termini patent. A1 segments patent. Anterior communicating artery normal. Anterior cerebral arteries patent to their distal aspects and are relatively normal in appearance. M1 segments patent without high-grade stenosis or occlusion. MCA bifurcations normal. No proximal M2 stenosis or occlusion. Distal MCA branches well opacified and symmetric bilaterally. Posterior circulation: Scattered atheromatous plaque within the V4 segments bilaterally with associated mild to moderate stenoses, slightly worse on the right (Series 502, image 246). Posterior inferior cerebral arteries patent bilaterally. Basilar artery widely patent to its distal aspect. Superior cerebral arteries patent bilaterally. Both of the posterior cerebral arteries primarily supplied via the basilar artery. Right PCA widely patent to its distal aspect without stenosis for significant atheromatous regularity. Left PCA patent proximally. Inferior left P3 segment attenuated and not well visualized distally, suspected to be possibly occluded given the acute left PCA infarct. Venous sinuses: Patent. Anatomic variants: No significant anatomic variant. No aneurysm or vascular malformation. Delayed phase: No pathologic enhancement. Review of the MIP images confirms the above findings IMPRESSION: CTA NECK IMPRESSION: 1. Mild eccentric plaque about the carotid bifurcations bilaterally without flow-limiting stenosis. No significant stenosis within the carotid arteries bilaterally. 2. Widely patent vertebral arteries within the neck. CTA HEAD IMPRESSION: 1. Attenuated and likely occluded left P3 segment, inferior division. 2. Focal atheromatous plaque within the V4 segments bilaterally with associated mild to moderate short-segment stenoses, slightly worse on the  right. Otherwise widely patent posterior circulation. 3. Widely patent anterior circulation. Electronically Signed   By: Rise Mu M.D.   On: 10/28/2015 23:20   2D Echocardiogram  - Left ventricle: The cavity size was normal. There was mild concentric hypertrophy. Systolic function was normal. The estimated ejection fraction was in the range of 50% to 55%. There is severe hypokinesis of the mid-apical inferolateral myocardium. In some views there is a suggestion of focal apical akinesis as well but the apex is not well visualized. Recommend definity contrast study to evaluate the apex further as well as rule out apicl thrombus since patient has had a TIA. Features are consistent with a pseudonormal left ventricular filling pattern,   with concomitant abnormal relaxation and increased filling pressure (grade 2 diastolic dysfunction). - Aortic valve: Trileaflet; mildly thickened, mildly calcified leaflets. There was mild regurgitation. Regurgitation pressure half-time: 700 ms. - Aorta: Ascending aorta diameter: 40 mm (ED). - Ascending aorta: The ascending aorta was mildly dilated. - Mitral valve: There was trivial regurgitation. - Left atrium: The atrium was mildly dilated. - Tricuspid valve: There was mild regurgitation. - Pulmonic valve: There was trivial regurgitation.   PHYSICAL EXAM Pleasant elderly Caucasian male not in distress. . Afebrile. Head is nontraumatic. Neck is supple without bruit.    Cardiac exam no murmur or gallop. Lungs are clear to auscultation. Distal pulses are well felt. Neurological Exam ;  Awake  Alert oriented x 3. Normal speech and language.Diminished recall 2/3. No visual field deficit on bedside confrontation testing.[place] movements full without nystagmus.fundi were not visualized. Vision acuity and fields appear normal. Hearing is normal. Palatal movements are normal. Face symmetric. Tongue midline. Normal strength, tone, reflexes and coordination. Normal  sensation. Gait deferred.  ASSESSMENT/PLAN Joel Payne is a 70 y.o. male with history of coronary artery disease, hypertension, hyper lipidemia and nephrolithiasis presenting with blurry right eye and localized headache on the left. He did not receive IV  t-PA due to deficits resolved.   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   MRI  left PCA infarct  MRA  left P3 stenosis, otherwise normal  CTA head and neck  Left PCA occlusion, otherwise unremarkable  2D Echo  EF 50-55%, no SOE seen however ? Apical akinesis, cannot rule out apical thrombus  TEE to look for embolic source. Arranged with [facility] for THURSDAY at 3 PM (scheduled tomorrow is full).  If positive for PFO (patent foramen ovale), check bilateral lower extremity venous dopplers to rule out DVT as possible source of stroke. (I have made patient NPO after midnight tonight).  If TEE negative, a [facility] electrophysiologist will consult and consider placement of an implantable loop recorder to evaluate for atrial fibrillation as etiology of stroke. This has been explained to patient/family by Dr. Pearlean Brownie and they are agreeable.   LDL 120  HgbA1c 5.4  Lovenox 40 mg sq daily for VTE prophylaxis  will restart diet including NPO after midnight tonight  aspirin 81 mg daily prior to admission, now on aspirin 325 mg daily. Continue at discharge  Patient counseled to be compliant with his antithrombotic medications  Patient considering Respect ESUS trial. [facility] [facility] will follow up with him.   Ongoing aggressive stroke risk factor management  Therapy recommendations:  No OT  Disposition:  Return home  [facility] for discharge once workup completed  Follow up Dr. Pearlean Brownie in 6 weeks. Order written.  Hypertension  Stable  Permissive hypertension (OK if < 220/120) but gradually normalize in 5-7 days  Long-term BP goal  normotensive  Hyperlipidemia  Home meds:  Pravachol 20 and Lovaza, resumed in hospital  LDL 120, goal < 70  Continue statin at discharge  Other Stroke Risk Factors  Advanced age  ETOH use, advised to drink no more than 2 drink(s) a day  Overweight, Body mass index is 28.19 kg/m., recommend weight loss, diet and exercise as appropriate   Other Active Problems  Sinus bradycardia  Hematuria  Hospital day # 2  Rhoderick Moody [facility] See Amion for Pager information 10/30/2015 10:07 AM  I have personally examined this patient, reviewed notes, independently viewed imaging studies, participated in medical decision making and plan of care.ROS completed by me personally and pertinent positives fully documented  I have made any additions or clarifications directly to the above note. Await TEE and loop tomorrow. Patient interested in possible participation in RESPECT ESUS trial and will give him info to review and decide.Delia Heady, MD Medical Director [facility] Pager: [phone] 10/30/2015 12:45 PM To contact Stroke Continuity provider, please refer to [email or web address]. After hours, contact General Neurology

## 2015-10-31 ENCOUNTER — Encounter (HOSPITAL_COMMUNITY)
Admission: EM | Disposition: A | Discharge status: Home / Self Care | Payer: Self-pay | Source: Home / Self Care | Attending: Internal Medicine

## 2015-10-31 ENCOUNTER — Encounter (HOSPITAL_COMMUNITY): Payer: Self-pay

## 2015-10-31 ENCOUNTER — Inpatient Hospital Stay (HOSPITAL_COMMUNITY): Payer: BC Managed Care – PPO

## 2015-10-31 DIAGNOSIS — I639 Cerebral infarction, unspecified: Secondary | ICD-10-CM

## 2015-10-31 DIAGNOSIS — I351 Nonrheumatic aortic (valve) insufficiency: Secondary | ICD-10-CM

## 2015-10-31 DIAGNOSIS — I4891 Unspecified atrial fibrillation: Secondary | ICD-10-CM

## 2015-10-31 HISTORY — PX: TEE WITHOUT CARDIOVERSION: SHX5443

## 2015-10-31 LAB — SURGICAL PCR SCREEN
MRSA, PCR: NEGATIVE
Staphylococcus aureus: NEGATIVE

## 2015-10-31 SURGERY — ECHOCARDIOGRAM, TRANSESOPHAGEAL
Anesthesia: Moderate Sedation

## 2015-10-31 SURGERY — LOOP RECORDER INSERTION

## 2015-10-31 MED ORDER — AMIODARONE HCL IN DEXTROSE 360-4.14 MG/200ML-% IV SOLN
60.0000 mg/h | INTRAVENOUS | Status: AC
  Filled 2015-10-31: qty 200

## 2015-10-31 MED ORDER — SODIUM CHLORIDE 0.9 % IV SOLN
INTRAVENOUS | Status: DC
  Administered 2015-10-31: 500 mL via INTRAVENOUS

## 2015-10-31 MED ORDER — AMIODARONE IV BOLUS ONLY 150 MG/100ML
150.0000 mg | Freq: Once | INTRAVENOUS | Status: AC
  Administered 2015-10-31: 150 mg via INTRAVENOUS
  Filled 2015-10-31: qty 100

## 2015-10-31 MED ORDER — APIXABAN 5 MG PO TABS
5.0000 mg | ORAL_TABLET | Freq: Two times a day (BID) | ORAL | Status: DC
  Administered 2015-10-31 – 2015-11-01 (×2): 5 mg via ORAL
  Filled 2015-10-31 (×2): qty 1

## 2015-10-31 MED ORDER — PERFLUTREN LIPID MICROSPHERE
INTRAVENOUS | Status: AC
  Filled 2015-10-31: qty 10

## 2015-10-31 MED ORDER — AMIODARONE HCL IN DEXTROSE 360-4.14 MG/200ML-% IV SOLN
30.0000 mg/h | INTRAVENOUS | Status: DC
  Administered 2015-10-31 – 2015-11-01 (×2): 30 mg/h via INTRAVENOUS
  Filled 2015-10-31 (×3): qty 200

## 2015-10-31 MED ORDER — METOPROLOL TARTRATE 25 MG PO TABS
25.0000 mg | ORAL_TABLET | Freq: Three times a day (TID) | ORAL | Status: DC
  Administered 2015-10-31 – 2015-11-01 (×3): 25 mg via ORAL
  Filled 2015-10-31 (×3): qty 1

## 2015-10-31 MED ORDER — AMIODARONE HCL IN DEXTROSE 360-4.14 MG/200ML-% IV SOLN
60.0000 mg/h | INTRAVENOUS | Status: AC
  Administered 2015-10-31: 60 mg/h via INTRAVENOUS

## 2015-10-31 MED ORDER — MIDAZOLAM HCL 10 MG/2ML IJ SOLN
INTRAMUSCULAR | Status: DC | PRN
  Administered 2015-10-31: 2 mg via INTRAVENOUS
  Administered 2015-10-31 (×2): 1 mg via INTRAVENOUS

## 2015-10-31 MED ORDER — MIDAZOLAM HCL 5 MG/ML IJ SOLN
INTRAMUSCULAR | Status: AC
  Filled 2015-10-31: qty 2

## 2015-10-31 MED ORDER — METOPROLOL TARTRATE 5 MG/5ML IV SOLN
INTRAVENOUS | Status: AC
  Filled 2015-10-31: qty 5

## 2015-10-31 MED ORDER — FENTANYL CITRATE (PF) 100 MCG/2ML IJ SOLN
INTRAMUSCULAR | Status: AC
  Filled 2015-10-31: qty 2

## 2015-10-31 MED ORDER — METOPROLOL TARTRATE 5 MG/5ML IV SOLN
INTRAVENOUS | Status: DC | PRN
  Administered 2015-10-31: 5 mg via INTRAVENOUS

## 2015-10-31 MED ORDER — FENTANYL CITRATE (PF) 100 MCG/2ML IJ SOLN
INTRAMUSCULAR | Status: DC | PRN
  Administered 2015-10-31: 50 ug via INTRAVENOUS

## 2015-10-31 MED ORDER — BUTAMBEN-TETRACAINE-BENZOCAINE 2-2-14 % EX AERO
INHALATION_SPRAY | CUTANEOUS | Status: DC | PRN
  Administered 2015-10-31: 2 via TOPICAL

## 2015-10-31 MED ORDER — METOPROLOL TARTRATE 5 MG/5ML IV SOLN
5.0000 mg | Freq: Once | INTRAVENOUS | Status: AC
Start: 2015-10-31 — End: 2015-10-31
  Administered 2015-10-31: 5 mg via INTRAVENOUS

## 2015-10-31 NOTE — CV Procedure (Signed)
TEE: During this procedure the patient is administered a total of Versed 4 mg and Fentanyl 50 mg to achieve and maintain moderate conscious sedation.  The patient's heart rate, blood pressure, and oxygen saturation are monitored continuously during the procedure. The period of conscious sedation is 30 minutes, of which I was present face-to-face 100% of this time. Patient also received 150 mg iv amiodarone and 5 mg iv lopressor as he went into rapid afib during procedure.   Normal LV no apical hypokinesis and no mural thrombus EF 65% Mild to moderate AR Trivial MR No LAA thrombus Normal RV Small PFO with positive bubble only with valsalva No aortic debris  Patient will not need loop recorder Will start Eliquis , beta blocker and continue iv amiodarone for acute onset PAF  Charlton HawsPeter Nishan

## 2015-10-31 NOTE — Progress Notes (Signed)
PROGRESS NOTE    Joel Cannyrthur D Locklin  YNW:[id number] DOB: [birth year] DOA: 10/27/2015 PCP: Rogelia BogaKWIATKOWSKI,PETER FRANK, MD   Outpatient Specialists:   Brief Narrative:  Joel Payne is a 70 y.o. male with medical history significant for hypertension and hyperlipidemia who presents to the emergency department with right-sided vision loss and left frontal headache. Patient reports that he had been in his usual state of health throughout the day with no recent illness and no recent trauma. At approximately 9:45 PM on the day of his presentation, patient noted vision loss affecting the right half of his visual field. He isn't sure if the vision loss was in one eye or both. He was working at his home computer when he was suddenly unable to see the right half of the screen. Vision seemed to return to normal within 20-30 minutes, by which time he had developed a headache behind the left eye. There was no change in his hearing, no loss of coordination, no speech disturbance, and no focal numbness or weakness. Patient has never experienced anything similar previously. He is a nonsmoker without diabetes or significant vascular disease, though he is known to have hypertension and hyperlipidemia. Visual deficits had resolved prior to coming to the emergency department.   Subjective:  Patient sitting a chair has no complaints today. No acute events overnight  Assessment & Plan:   CVA, likely secondary to A. fib -TEE was done, noted to be in A. fib post TEE -Started on Eliquis, and amiodarone for acute onset of A. fib -ASA -statin HgbA1C 5.4 -patient has NO symptoms - Neuro recommendations appreciated  A. Fib - new diagnosis - Cardiology recommendations appreciated - Amiodarone and beta blocker - Will stop IV amiodarone in the morning  Hypertension - At goal currently  - Continue beta blocker  Hemoglobin in urine -outpatient follow up  DVT prophylaxis: Lovenox  Code Status: Full Code Family  Communication: None at bedside Disposition Plan:  If no complication will d/c home tomorrow  Consultants:   Neuro  Cardiology   Objective: Vitals:   10/30/15 2128 10/31/15 0050 10/31/15 0542 10/31/15 1030  BP: (!) 162/74 (!) 162/82 (!) 182/79 (!) 183/81  Pulse: (!) 56 (!) 54 (!) 55 (!) 59  Resp: 16 16 18 19   Temp: 98.2 F (36.8 C) 97.8 F (36.6 C) 97.8 F (36.6 C) 97.8 F (36.6 C)  TempSrc: Oral Oral Oral Oral  SpO2: 99% 96% 97% 96%  Weight:      Height:       No intake or output data in the 24 hours ending 10/31/15 1037 Filed Weights   10/27/15 2159 10/28/15 0620  Weight: 89.4 kg (197 lb) 84.1 kg (185 lb 6.5 oz)    Examination:  General exam: Appears calm and comfortable  Central nervous system: Alert and oriented. No focal neurological deficits.  Data Reviewed: I have personally reviewed following labs and imaging studies  CBC:  Recent Labs Lab 10/27/15 2238 10/27/15 2249  WBC 6.5  --   NEUTROABS 3.3  --   HGB 15.5 15.0  HCT 43.0 44.0  MCV 83.7  --   PLT 185  --    Basic Metabolic Panel:  Recent Labs Lab 10/27/15 2238 10/27/15 2249  NA 139 142  K 3.6 3.6  CL 105 102  CO2 25  --   GLUCOSE 101* 99  BUN 19 19  CREATININE 0.94 1.00  CALCIUM 9.1  --    GFR: Estimated Creatinine Clearance: 72.6 mL/min (by C-G formula  based on SCr of 1 mg/dL). Liver Function Tests:  Recent Labs Lab 10/27/15 2238  AST 20  ALT 21  ALKPHOS 47  BILITOT 1.0  PROT 7.3  ALBUMIN 4.4   No results for input(s): LIPASE, AMYLASE in the last 168 hours. No results for input(s): AMMONIA in the last 168 hours. Coagulation Profile:  Recent Labs Lab 10/27/15 2238  INR 1.03   Cardiac Enzymes: No results for input(s): CKTOTAL, CKMB, CKMBINDEX, TROPONINI in the last 168 hours. BNP (last 3 results) No results for input(s): PROBNP in the last 8760 hours. HbA1C: No results for input(s): HGBA1C in the last 72 hours. CBG: No results for input(s): GLUCAP in the last  168 hours. Lipid Profile: No results for input(s): CHOL, HDL, LDLCALC, TRIG, CHOLHDL, LDLDIRECT in the last 72 hours. Thyroid Function Tests: No results for input(s): TSH, T4TOTAL, FREET4, T3FREE, THYROIDAB in the last 72 hours. Anemia Panel: No results for input(s): VITAMINB12, FOLATE, FERRITIN, TIBC, IRON, RETICCTPCT in the last 72 hours. Urine analysis:    Component Value Date/Time   COLORURINE YELLOW 10/27/2015 2322   APPEARANCEUR CLEAR 10/27/2015 2322   LABSPEC 1.014 10/27/2015 2322   PHURINE 5.5 10/27/2015 2322   GLUCOSEU NEGATIVE 10/27/2015 2322   HGBUR LARGE (A) 10/27/2015 2322   HGBUR 3+ 11/26/2009 0814   BILIRUBINUR NEGATIVE 10/27/2015 2322   BILIRUBINUR Negative 09/10/2014 0945   KETONESUR NEGATIVE 10/27/2015 2322   PROTEINUR NEGATIVE 10/27/2015 2322   UROBILINOGEN 0.2 09/10/2014 0945   UROBILINOGEN 1.0 09/07/2011 2208   NITRITE NEGATIVE 10/27/2015 2322   LEUKOCYTESUR NEGATIVE 10/27/2015 2322    ) Recent Results (from the past 240 hour(s))  Surgical PCR screen     Status: None   Collection Time: 10/30/15  8:24 PM  Result Value Ref Range Status   MRSA, PCR NEGATIVE NEGATIVE Final   Staphylococcus aureus NEGATIVE NEGATIVE Final    Comment:        The Xpert SA Assay (FDA approved for NASAL specimens in patients over 8 years of age), is one component of a comprehensive surveillance program.  Test performance has been validated by [facility] for patients greater than or equal to 32 year old. It is not intended to diagnose infection nor to guide or monitor treatment.       Anti-infectives    None     Radiology Studies: No results found.    Scheduled Meds: .  stroke: mapping our early stages of recovery book   Does not apply Once  . aspirin  300 mg Rectal Daily   Or  . aspirin  325 mg Oral Daily  . atenolol  25 mg Oral Daily  . enoxaparin (LOVENOX) injection  40 mg Subcutaneous Q24H  . omega-3 acid ethyl esters  1 g Oral Daily  . pravastatin   40 mg Oral Daily   Continuous Infusions:    LOS: 3 days   Time spent: 15 min  Latrelle Dodrill, MD [facility] Pager [phone]  If 7PM-7AM, please contact night-coverage [email or web address] Password [email or web address] 10/31/2015, 10:37 AM

## 2015-10-31 NOTE — Consult Note (Signed)
ELECTROPHYSIOLOGY CONSULT NOTE  Patient ID: Joel Payne MRN: [id number], DOB/AGE: August 17, 1945   Admit date: 10/27/2015 Date of Consult: 10/31/2015  Primary Physician: [name], MD Primary Cardiologist: previously Dr Myrtis Ser Reason for Consultation: Cryptogenic stroke; recommendations regarding Implantable Loop Recorder  History of Present Illness Joel Payne was admitted on 10/27/2015 with blurred vision and headache.  Imaging demonstrated left PCA infarct felt to be embolic 2/2 unknown source.  He has undergone workup for stroke including echocardiogram and carotid dopplers.  The patient has been monitored on telemetry which has demonstrated sinus rhythm with no arrhythmias.  Inpatient stroke work-up is to be completed with a TEE.   Echocardiogram this admission demonstrated EF 50-55%, severe hypokinesis of mid-apical inferiorlateral myocardium, unable to rule out apical thrombus, grade 2 diastolic dysfunction, LA 44.  Lab work is reviewed.  Prior to admission, the patient denies chest pain, shortness of breath, dizziness, palpitations, or syncope.  They are recovering from their stroke with plans to return home at discharge.  EP has been asked to evaluate for placement of an implantable loop recorder to monitor for atrial fibrillation.   Past Medical History:  Diagnosis Date  . ALLERGIC RHINITIS 11/08/2006  . BENIGN PROSTATIC HYPERTROPHY 10/12/2006  . CORONARY ARTERY DISEASE 10/12/2006   Catheterization was normal 2012, mild nonobstructive coronary disease, normal LV function  . HYPERLIPIDEMIA 11/08/2006  . HYPERTENSION 10/12/2006  . Kidney stone    in 1980  . 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 Shortness of breath    April, 2012  /  catheterization May, 2012 mild nonobstructive coronary disease  . TIA (transient ischemic attack) 10/2015  . Urinary frequency 10/18/2009     Surgical History:  Past Surgical History:  Procedure Laterality  Date  . CARDIAC CATHETERIZATION  2012  . non radical prostatectomy  08/2011  . PROSTATE BIOPSY  2008  . TONSILLECTOMY       Prescriptions Prior to Admission  Medication Sig Dispense Refill Last Dose  . aspirin EC 81 MG tablet Take 81 mg by mouth daily.   10/27/2015 at Unknown time  . atenolol (TENORMIN) 25 MG tablet Take 25 mg by mouth daily.   10/27/2015 at 0830  . omega-3 acid ethyl esters (LOVAZA) 1 G capsule Take 1 g by mouth daily.   10/27/2015 at Unknown time  . pravastatin (PRAVACHOL) 20 MG tablet Take 1 tablet (20 mg total) by mouth daily. 90 tablet 3 10/27/2015 at Unknown time  . atenolol (TENORMIN) 50 MG tablet TAKE 1 TABLET BY MOUTH EVERY DAY (Patient not taking: Reported on 10/27/2015) 90 tablet 0 Not Taking at Unknown time    Inpatient Medications:  .  stroke: mapping our early stages of recovery book   Does not apply Once  . aspirin  300 mg Rectal Daily   Or  . aspirin  325 mg Oral Daily  . atenolol  25 mg Oral Daily  . enoxaparin (LOVENOX) injection  40 mg Subcutaneous Q24H  . omega-3 acid ethyl esters  1 g Oral Daily  . pravastatin  40 mg Oral Daily    Allergies:  Allergies  Allergen Reactions  . Simvastatin Other (See Comments)    Leg cramps    Social History   Social History  . Marital status: Single    Spouse name: N/A  . Number of children: N/A  . Years of education: N/A   Occupational History  . Not on file.   Social History Main  Topics  . Smoking status: Never Smoker  . Smokeless tobacco: Never Used  . Alcohol use 0.5 oz/week    1 drink(s) per week     Comment: occasionally  . Drug use: No  . Sexual activity: Not on file   Other Topics Concern  . Not on file   Social History Narrative  . No narrative on file     Family History  Problem Relation Age of Onset  . Colon cancer Neg Hx   . Esophageal cancer Neg Hx   . Rectal cancer Neg Hx   . Stomach cancer Neg Hx       Review of Systems: All other systems reviewed and are  otherwise negative except as noted above.  Physical Exam: Vitals:   10/30/15 1757 10/30/15 2128 10/31/15 0050 10/31/15 0542  BP: (!) 154/60 (!) 162/74 (!) 162/82 (!) 182/79  Pulse: (!) 55 (!) 56 (!) 54 (!) 55  Resp: 16 16 16 18   Temp: 98.1 F (36.7 C) 98.2 F (36.8 C) 97.8 F (36.6 C) 97.8 F (36.6 C)  TempSrc: Oral Oral Oral Oral  SpO2: 96% 99% 96% 97%  Weight:      Height:        GEN- The patient is well appearing, alert and oriented x 3 today.   Head- normocephalic, atraumatic Eyes-  Sclera clear, conjunctiva pink Ears- hearing intact Oropharynx- clear Neck- supple Lungs- Clear to ausculation bilaterally, normal work of breathing Heart- Regular rate and rhythm, no murmurs, rubs or gallops  GI- soft, NT, ND, + BS Extremities- no clubbing, cyanosis, or edema MS- no significant deformity or atrophy Skin- no rash or lesion Psych- euthymic mood, full affect   Labs:   Lab Results  Component Value Date   WBC 6.5 10/27/2015   HGB 15.0 10/27/2015   HCT 44.0 10/27/2015   MCV 83.7 10/27/2015   PLT 185 10/27/2015    Recent Labs Lab 10/27/15 2238 10/27/15 2249  NA 139 142  K 3.6 3.6  CL 105 102  CO2 25  --   BUN 19 19  CREATININE 0.94 1.00  CALCIUM 9.1  --   PROT 7.3  --   BILITOT 1.0  --   ALKPHOS 47  --   ALT 21  --   AST 20  --   GLUCOSE 101* 99    Radiology/Studies: Ct Angio Head W Or Wo Contrast Result Date: 10/28/2015 CLINICAL DATA:  Initial evaluation for acute stroke. Found have left PCA infarct on prior MRI. EXAM: CT ANGIOGRAPHY HEAD AND NECK TECHNIQUE: Multidetector CT imaging of the head and neck was performed using the standard protocol during bolus administration of intravenous contrast. Multiplanar CT image reconstructions and MIPs were obtained to evaluate the vascular anatomy. Carotid stenosis measurements (when applicable) are obtained utilizing NASCET criteria, using the distal internal carotid diameter as the denominator. CONTRAST:  50 cc of  Isovue 370. The CT could be obtained CTA chest 5 in MR the COMPARISON:  Previous MRI from earlier the same day. FINDINGS: CTA NECK FINDINGS Aortic arch: Visualized aortic arch of normal caliber with normal 3 vessel morphology. Mild scattered plaque within the proximal descending intrathoracic aorta and about the origin of the great vessels without high-grade stenosis. Visualized subclavian arteries are widely patent. Right carotid system: Right common carotid artery widely patent from its origin to the bifurcation. Mild eccentric calcified plaque about the right bifurcation without flow-limiting stenosis. Right ICA widely patent distally to the skullbase without stenosis, dissection, or occlusion.  Left carotid system: Left common carotid artery patent from its origin to the bifurcation. Mild eccentric plaque about the left bifurcation (slightly worse on the right) without flow-limiting stenosis. Left ICA patent distally to the skullbase without stenosis, dissection, or occlusion. Vertebral arteries: Both of the vertebral arteries arise from the subclavian arteries. Vertebral arteries patent within the neck without stenosis, dissection, or occlusion. Skeleton: No acute osseous abnormality. No worrisome lytic or blastic osseous lesions. Moderate multilevel degenerative spondylolysis noted within the visualized spine, greatest at C6-7. Other neck: Thyroid normal. No adenopathy within the neck. No acute soft tissue abnormality. Upper chest: Visualized mediastinum within normal limits. Visualized lungs are clear. Review of the MIP images confirms the above findings CTA HEAD FINDINGS Anterior circulation: Petrous, cavernous, and supraclinoid segments are widely patent bilaterally. ICA termini patent. A1 segments patent. Anterior communicating artery normal. Anterior cerebral arteries patent to their distal aspects and are relatively normal in appearance. M1 segments patent without high-grade stenosis or occlusion. MCA  bifurcations normal. No proximal M2 stenosis or occlusion. Distal MCA branches well opacified and symmetric bilaterally. Posterior circulation: Scattered atheromatous plaque within the V4 segments bilaterally with associated mild to moderate stenoses, slightly worse on the right (Series 502, image 246). Posterior inferior cerebral arteries patent bilaterally. Basilar artery widely patent to its distal aspect. Superior cerebral arteries patent bilaterally. Both of the posterior cerebral arteries primarily supplied via the basilar artery. Right PCA widely patent to its distal aspect without stenosis for significant atheromatous regularity. Left PCA patent proximally. Inferior left P3 segment attenuated and not well visualized distally, suspected to be possibly occluded given the acute left PCA infarct. Venous sinuses: Patent. Anatomic variants: No significant anatomic variant. No aneurysm or vascular malformation. Delayed phase: No pathologic enhancement. Review of the MIP images confirms the above findings IMPRESSION: CTA NECK IMPRESSION: 1. Mild eccentric plaque about the carotid bifurcations bilaterally without flow-limiting stenosis. No significant stenosis within the carotid arteries bilaterally. 2. Widely patent vertebral arteries within the neck. CTA HEAD IMPRESSION: 1. Attenuated and likely occluded left P3 segment, inferior division. 2. Focal atheromatous plaque within the V4 segments bilaterally with associated mild to moderate short-segment stenoses, slightly worse on the right. Otherwise widely patent posterior circulation. 3. Widely patent anterior circulation. Electronically Signed   By: Rise Mu M.D.   On: 10/28/2015 23:20   12-lead ECG sinus rhythm, rate 58 All prior EKG's in EPIC reviewed with no documented atrial fibrillation  Telemetry sinus rhythm  Assessment and Plan:  1. Cryptogenic stroke The patient presents with cryptogenic stroke.  The patient has a TEE planned for  today.  I spoke at length with the patient about monitoring for afib with an implantable loop recorder.  Risks, benefits, and alteratives to implantable loop recorder were discussed with the patient today.   At this time, the patient is very clear in their decision to proceed with implantable loop recorder.   Wound care was reviewed with the patient (keep incision clean and dry for 3 days).   Please call with questions.   Gypsy Balsam, NP 10/31/2015 9:26 AM  I have seen, examined the patient, and reviewed the above assessment and plan.  On exam, RRR.  Changes to above are made where necessary.    Co Sign: Hillis Range, MD 10/31/2015 12:47 PM

## 2015-10-31 NOTE — Progress Notes (Signed)
*  PRELIMINARY RESULTS* Echocardiogram Echocardiogram Transesophageal has been performed.  Jeryl Columbialliott, Levina Boyack 10/31/2015, 2:37 PM

## 2015-10-31 NOTE — Progress Notes (Signed)
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 Awaiting test results.    OBJECTIVE Temp:  [97.8 F (36.6 C)-98.2 F (36.8 C)] 97.8 F (36.6 C) (10/19 0542) Pulse Rate:  [53-62] 55 (10/19 0542) Cardiac Rhythm: Sinus bradycardia (10/19 0700) Resp:  [16-18] 18 (10/19 0542) BP: (154-182)/(60-82) 182/79 (10/19 0542) SpO2:  [96 %-99 %] 97 % (10/19 0542)  CBC:   Recent Labs Lab 10/27/15 2238 10/27/15 2249  WBC 6.5  --   NEUTROABS 3.3  --   HGB 15.5 15.0  HCT 43.0 44.0  MCV 83.7  --   PLT 185  --     Basic Metabolic Panel:   Recent Labs Lab 10/27/15 2238 10/27/15 2249  NA 139 142  K 3.6 3.6  CL 105 102  CO2 25  --   GLUCOSE 101* 99  BUN 19 19  CREATININE 0.94 1.00  CALCIUM 9.1  --     Lipid Panel:     Component Value Date/Time   CHOL 184 10/28/2015 1024   TRIG 160 (H) 10/28/2015 1024   HDL 32 (L) 10/28/2015 1024   CHOLHDL 5.8 10/28/2015 1024   VLDL 32 10/28/2015 1024   LDLCALC 120 (H) 10/28/2015 1024   HgbA1c:  Lab Results  Component Value Date   HGBA1C 5.4 10/28/2015   Urine Drug Screen:     Component Value Date/Time   LABOPIA NONE DETECTED 10/27/2015 2322   COCAINSCRNUR NONE DETECTED 10/27/2015 2322   LABBENZ NONE DETECTED 10/27/2015 2322   AMPHETMU NONE DETECTED 10/27/2015 2322   THCU NONE DETECTED 10/27/2015 2322   LABBARB NONE DETECTED 10/27/2015 2322      IMAGING  TEE pending    PHYSICAL EXAM Pleasant elderly Caucasian male not in distress. . Afebrile. Head is nontraumatic. Neck is supple without bruit.    Cardiac exam no murmur or gallop. Lungs are clear to auscultation. Distal pulses are well felt. Neurological Exam ;  Awake  Alert oriented x 3. Normal speech and language.Diminished recall 2/3. No visual field deficit on bedside confrontation testing.Marland Kitchen.eye movements full without nystagmus.fundi were not visualized. Vision acuity and fields appear normal. Hearing is normal.  Palatal movements are normal. Face symmetric. Tongue midline. Normal strength, tone, reflexes and coordination. Normal sensation. Gait deferred.  ASSESSMENT/PLAN [name] is a 70 y.o. male with history of coronary artery disease, hypertension, hyper lipidemia and nephrolithiasis presenting with blurry right eye and localized headache on the left. He did not receive IV t-PA due to deficits resolved.   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   MRI  left PCA infarct  MRA  left P3 stenosis, otherwise normal  CTA head and neck  Left PCA occlusion, otherwise unremarkable  2D Echo  EF 50-55%, no SOE seen however ? Apical akinesis, cannot rule out apical thrombus  TEE to look for embolic source. Arranged with [facility] for today at 3 PM .  If positive for PFO (patent foramen ovale), check bilateral lower extremity venous dopplers to rule out DVT as possible source of stroke.  If TEE negative, a [facility] electrophysiologist will consult and consider placement of an implantable loop recorder to evaluate for atrial fibrillation as etiology of stroke. This has been explained to patient/family by Dr. Pearlean BrownieSethi and they are agreeable.   LDL 120  HgbA1c 5.4  Lovenox 40 mg sq daily for VTE prophylaxis Diet NPO time specifiedwill  restart diet including NPO after midnight tonight  aspirin 81 mg daily prior to admission, now on aspirin 325 mg daily. Continue at discharge  Patient counseled to be compliant with his antithrombotic medications  Patient considering Respect ESUS trial. [employer] will follow up with him.   Ongoing aggressive stroke risk factor management  Therapy recommendations:  No OT, no PT  Disposition:  Return home  [facility] for discharge once workup completed  Follow up Dr. Pearlean Brownie in 6 weeks. Order written.  Hypertension  Stable Long-term BP goal  normotensive  Hyperlipidemia  Home meds:  Pravachol 20 and Lovaza, resumed in hospital  Pravachol increased to 40, agree  LDL 120, goal < 70  Continue statin at discharge  Other Stroke Risk Factors  Advanced age  ETOH use, advised to drink no more than 2 drink(s) a day  Overweight, Body mass index is 28.19 kg/m., recommend weight loss, diet and exercise as appropriate   Other Active Problems  Sinus bradycardia  Hematuria  Hospital day # 3  Rhoderick [facility] See Amion for Pager information 10/31/2015 9:54 AM  I have personally examined this patient, reviewed notes, independently viewed imaging studies, participated in medical decision making and plan of care.ROS completed by me personally and pertinent positives fully documented  I have made any additions or clarifications directly to the above note. Agree with note above.  Await TEE and loop.F/U as out patient in stroke clinic in 6 weeks  Delia Heady, MD Medical Director [facility] Pager: [phone] 10/31/2015 3:16 PM   To contact Stroke Continuity provider, please refer to [email or web address]. After hours, contact General Neurology

## 2015-10-31 NOTE — Progress Notes (Addendum)
ANTICOAGULATION CONSULT NOTE - Follow Up Consult  Pharmacy Consult for apixaban Indication: new onset afib   Assessment: 70 year old male s/p TEE noted to have new onset afib. Orders to start apixban. No dose adjustments necessary. Sq lovenox given 10/18. No cbc since 10/15 which was normal, will check in am.   Hematuria mentioned in prior notes, will follow closely for signs and symptoms of bleeding.  Goal of Therapy:   Monitor platelets by anticoagulation protocol: Yes   Plan:  Apixaban 5mg  bid Possible reduction of aspirin dose prior to discharge Consult case management   Allergies  Allergen Reactions  . Simvastatin Other (See Comments)    Leg cramps    Patient Measurements: Height: 5\' 8"  (172.7 cm) Weight: 185 lb 6.5 oz (84.1 kg) IBW/kg (Calculated) : 68.4   Vital Signs: Temp: 98 F (36.7 C) (10/19 1535) Temp Source: Oral (10/19 1535) BP: 150/89 (10/19 1535) Pulse Rate: 113 (10/19 1535)  Labs: No results for input(s): HGB, HCT, PLT, APTT, LABPROT, INR, HEPARINUNFRC, HEPRLOWMOCWT, CREATININE, CKTOTAL, CKMB, TROPONINI in the last 72 hours.  Estimated Creatinine Clearance: 72.6 mL/min (by C-G formula based on SCr of 1 mg/dL).   Sheppard CoilFrank Wilson PharmD., BCPS Clinical Pharmacist Pager [phone] 10/31/2015 3:47 PM

## 2015-10-31 NOTE — Progress Notes (Signed)
Pt noted to have afib post TEE. I agree with Dr Eden EmmsNishan that anticoagulation is prudent. Per neurology, OK to start eliquis. Would stop IV amiodarone in AM. Doubt will need outpatient AAD therapy given paucity of symptoms.  Follow-up in AF clinic arranged for next week.  Electrophysiology team to see as needed while here. Please call with questions.  Hillis RangeJames Leslea Vowles MD, [facility] 10/31/2015 2:50 PM

## 2015-11-01 ENCOUNTER — Encounter (HOSPITAL_COMMUNITY): Payer: Self-pay | Admitting: Cardiovascular Disease

## 2015-11-01 LAB — CBC
HCT: 45.1 % (ref 39.0–52.0)
Hemoglobin: 15.7 g/dL (ref 13.0–17.0)
MCH: 29.8 pg (ref 26.0–34.0)
MCHC: 34.8 g/dL (ref 30.0–36.0)
MCV: 85.7 fL (ref 78.0–100.0)
PLATELETS: 169 10*3/uL (ref 150–400)
RBC: 5.26 MIL/uL (ref 4.22–5.81)
RDW: 13.1 % (ref 11.5–15.5)
WBC: 7.3 10*3/uL (ref 4.0–10.5)

## 2015-11-01 MED ORDER — APIXABAN 5 MG PO TABS
5.0000 mg | ORAL_TABLET | Freq: Two times a day (BID) | ORAL | 1 refills | Status: DC
Start: 2015-11-01 — End: 2015-12-25

## 2015-11-01 MED ORDER — METOPROLOL TARTRATE 25 MG PO TABS: 25.0000 mg | ORAL_TABLET | Freq: Two times a day (BID) | ORAL | 0 refills | Status: DC

## 2015-11-01 NOTE — Discharge Instructions (Signed)
You have an appointment set up with the Atrial Fibrillation Clinic.  Multiple studies have shown that being followed by a dedicated atrial fibrillation clinic in addition to the standard care you receive from your other physicians improves health. We believe that enrollment in the atrial fibrillation clinic will allow us to better care for you.   The phone number to the Atrial Fibrillation Clinic is [phone]. The clinic is staffed Monday through Friday from 8:30am to 5pm.  Parking Directions: The clinic is located in the Heart and Vascular Building [facility] to [facility] . 1)From [address] to [address] and go to the 3rd entrance  (Heart and Vascular entrance) on the right. 2)Look to the right for Heart &Vascular Parking Garage. 3)A code for the entrance is required please call the clinic to receive this.   4)Take the elevators to the 1st floor. Registration is in the room with the glass walls at the end of the hallway.  If you have any trouble parking or locating the clinic, please dont hesitate to call [phone].  Information on my medicine - ELIQUIS (apixaban)  This medication education was reviewed with me or my healthcare representative as part of my discharge preparation.  The pharmacist that spoke with me during my hospital stay was:  Severiano GilbertWilson, Tandra Rosado Rhea, [facility]  Why was Eliquis prescribed for you? Eliquis was prescribed for you to reduce the risk of a blood clot forming that can cause a stroke if you have a medical condition called atrial fibrillation (a type of irregular heartbeat).  What do You need to know about Eliquis ? Take your Eliquis TWICE DAILY - one tablet in the morning and one tablet in the evening with or without food. If you have difficulty swallowing the tablet whole please discuss with your pharmacist how to take the medication safely.  Take Eliquis exactly as prescribed by your doctor and DO NOT stop taking Eliquis without talking  to the doctor who prescribed the medication.  Stopping may increase your risk of developing a stroke.  Refill your prescription before you run out.  After discharge, you should have regular check-up appointments with your healthcare provider that is prescribing your Eliquis.  In the future your dose may need to be changed if your kidney function or weight changes by a significant amount or as you get older.  What do you do if you miss a dose? If you miss a dose, take it as soon as you remember on the same day and resume taking twice daily.  Do not take more than one dose of ELIQUIS at the same time to make up a missed dose.  Important Safety Information A possible side effect of Eliquis is bleeding. You should call your healthcare provider right away if you experience any of the following: ? Bleeding from an injury or your nose that does not stop. ? Unusual colored urine (red or dark brown) or unusual colored stools (red or black). ? Unusual bruising for unknown reasons. ? A serious fall or if you hit your head (even if there is no bleeding).  Some medicines may interact with Eliquis and might increase your risk of bleeding or clotting while on Eliquis. To help avoid this, consult your healthcare provider or pharmacist prior to using any new prescription or non-prescription medications, including herbals, vitamins, non-steroidal anti-inflammatory drugs (NSAIDs) and supplements.  This website has more information on Eliquis (apixaban): http://www.eliquis.com/eliquis/home

## 2015-11-01 NOTE — Progress Notes (Signed)
STROKE TEAM PROGRESS NOTE   SUBJECTIVE (INTERVAL HISTORY) Up in chair, on his phone. Still on amiodarone after onset of atrial fibrillation during TEE yesterday. HR now controlled no neuro symptoms.   OBJECTIVE Temp:  [97.4 F (36.3 C)-98 F (36.7 C)] 97.7 F (36.5 C) (10/20 0816) Pulse Rate:  [35-120] 60 (10/20 0816) Cardiac Rhythm: Atrial fibrillation (10/19 2000) Resp:  [13-21] 18 (10/20 0816) BP: (127-226)/(66-108) 154/81 (10/20 0816) SpO2:  [92 %-100 %] 98 % (10/20 0816)  CBC:   Recent Labs Lab 10/27/15 2238 10/27/15 2249 11/01/15 0604  WBC 6.5  --  7.3  NEUTROABS 3.3  --   --   HGB 15.5 15.0 15.7  HCT 43.0 44.0 45.1  MCV 83.7  --  85.7  PLT 185  --  169    Basic Metabolic Panel:   Recent Labs Lab 10/27/15 2238 10/27/15 2249  NA 139 142  K 3.6 3.6  CL 105 102  CO2 25  --   GLUCOSE 101* 99  BUN 19 19  CREATININE 0.94 1.00  CALCIUM 9.1  --     Lipid Panel:     Component Value Date/Time   CHOL 184 10/28/2015 1024   TRIG 160 (H) 10/28/2015 1024   HDL 32 (L) 10/28/2015 1024   CHOLHDL 5.8 10/28/2015 1024   VLDL 32 10/28/2015 1024   LDLCALC 120 (H) 10/28/2015 1024   HgbA1c:  Lab Results  Component Value Date   HGBA1C 5.4 10/28/2015   Urine Drug Screen:     Component Value Date/Time   LABOPIA NONE DETECTED 10/27/2015 2322   COCAINSCRNUR NONE DETECTED 10/27/2015 2322   LABBENZ NONE DETECTED 10/27/2015 2322   AMPHETMU NONE DETECTED 10/27/2015 2322   THCU NONE DETECTED 10/27/2015 2322   LABBARB NONE DETECTED 10/27/2015 2322      IMAGING  TEE Normal LV no apical hypokinesis and no mural thrombus EF 65% Mild to moderate AR Trivial MR No LAA thrombus Normal RV Small PFO with positive bubble only with valsalva No aortic debris   PHYSICAL EXAM Pleasant elderly Caucasian male not in distress. . Afebrile. Head is nontraumatic. Neck is supple without bruit.    Cardiac exam no murmur or gallop. Lungs are clear to auscultation. Distal pulses are  well felt. Neurological Exam ;  Awake  Alert oriented x 3. Normal speech and language.Diminished recall 2/3. No visual field deficit on bedside confrontation testing.[place] movements full without nystagmus.fundi were not visualized. Vision acuity and fields appear normal. Hearing is normal. Palatal movements are normal. Face symmetric. Tongue midline. Normal strength, tone, reflexes and coordination. Normal sensation. Gait deferred.  ASSESSMENT/PLAN [name] is a 70 y.o. male with history of coronary artery disease, hypertension, hyper lipidemia and nephrolithiasis presenting with blurry right eye and localized headache on the left. He did not receive IV t-PA due to deficits resolved.   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 Apical akinesis, cannot rule out apical thrombus  TEE small PFO. Went into atrial fibrillation during the TEE  No loop needed given atrial fibrillation finding.   LDL 120  HgbA1c 5.4  Lovenox 40 mg sq daily for VTE prophylaxis Diet Heart Room service appropriate? Yes; Fluid consistency: Thinwill restart diet including NPO after midnight tonight  aspirin 81 mg daily prior to admission, now on Eliquis (apixaban) daily. Continue eliquis at discharge  Patient counseled to be compliant with his antithrombotic medications  Ongoing aggressive stroke risk factor management  Therapy recommendations:  No OT, no PT  Disposition:  [facility]  Follow up Dr. Pearlean BrownieSethi in 6 weeks. Order written.  Atrial Fibrillation, new dx  CHA2DS2-VASc Score = 4, ?2 oral anticoagulation recommended  Age in Years:  4065-74   +1    Sex:  Male   0    Hypertension History: yes   +1     Diabetes Mellitus:  0   Congestive Heart Failure  History:  0  Vascular Disease History:  0     Stroke/TIA/Thromboembolism History:  yes   +2  Continue eliquis at discharge    Hypertension  Stable Long-term BP goal normotensive  Hyperlipidemia  Home meds:  Pravachol 20 and Lovaza, resumed in hospital  Pravachol increased to 40, agree  LDL 120, goal < 70  Continue statin at discharge  Other Stroke Risk Factors  Advanced age  ETOH use, advised to drink no more than 2 drink(s) a day  Overweight, Body mass index is 28.19 kg/m., recommend weight loss, diet and exercise as appropriate   Other Active Problems  Sinus bradycardia  Hematuria  Hospital day # 4  BIBY,SHARON  [facility] See Amion for Pager information 11/01/2015 8:55 AM  I have personally examined this patient, reviewed notes, independently viewed imaging studies, participated in medical decision making and plan of care.ROS completed by me personally and pertinent positives fully documented  I have made any additions or clarifications directly to the above note. He has new diagnosis of new-onset atrial fibrillation and needs long-term anticoagulation with eliquis. A small incidental PFO but I do not believe it needs intervention at the present time. Greater than 50% time during this 25 minute visit was spent on counseling and coordination of care of her atrial fibrillation and stroke risk. Follow-up as outpatient in stroke clinic in 6 weeks. Stroke team and will sign off. Kindly call for questions if any  Joel HeadyPramod Fatema Rabe, MD Medical Director [facility] Pager: [phone] 11/01/2015 12:47 PM     To contact Stroke Continuity provider, please refer to [email or web address]. After hours, contact General Neurology

## 2015-11-01 NOTE — Discharge Summary (Signed)
Physician Discharge Summary  Joel Payne  ZOX:[id number]  DOB: [birth year]  DOA: 10/27/2015 PCP: [name], MD  Admit date: 10/27/2015 Discharge date: 11/01/2015  Admitted From: Home Disposition:  Home  Recommendations for Outpatient Follow-up:  1. Follow up with PCP in 3-5 days 2. Please obtain BMP/CBC in one week 3. Follow up with cardiology in 1 week   Home Health: None  Equipment/Devices: None  Discharge Condition: Stable  CODE STATUS: FULL Diet recommendation: Heart Healthy   Brief/Interim Summary: Joel Nguyen Murphyis a 70 y.o.malewith medical history significant for hypertension and hyperlipidemia who presents to the emergency department with right-sided vision loss and left frontal headache. Patient reports that he had been in his usual state of health throughout the day with no recent illness and no recent trauma. At approximately 9:45 PM on the day of his presentation, patient noted vision loss affecting the right half of his visual field. He isn't sure if the vision loss was in one eye or both. He was working at his home computer when he was suddenly unable to see the right half of the screen. Vision seemed to return to normal within 20-30 minutes, by which time he had developed a headache behind the left eye. There was no change in his hearing, no loss of coordination, no speech disturbance, and no focal numbness or weakness. Patient has never experienced anything similar previously. He is a nonsmoker without diabetes or significant vascular disease, though he is known to have hypertension and hyperlipidemia. Visual deficits had resolved prior to coming to the emergency department. 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 Patient had an ECHO which showed EF 50-55%, with apical akinesis, subsequently TEE was done where he was found to be on Afib, and a small PFO. Patient was started on Amio drip, rhythm was converted, also started on  A/C with eliquis. Patient stable with no neurological deficits. D/c home on rate control medication, Metoprolol 25mg  BID and anticoagulation with Eliquis.   Subjective:  Patient seen and examined with daughter at bedside. Back to sinus rhythm last night. No acute events, no complaints.  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 Dr. Pearlean Brownie prefers follow up in 6 weeks   Call MD for:  difficulty breathing, headache or visual disturbances    Complete by:  As directed    Call MD for:  extreme fatigue    Complete by:  As directed    Call MD for:  hives    Complete by:  As directed    Call MD for:  persistant dizziness or light-headedness    Complete by:  As directed    Call MD for:  persistant nausea and vomiting    Complete by:  As directed    Call MD for:  redness, tenderness, or signs of  infection (pain, swelling, redness, odor or green/yellow discharge around incision site)    Complete by:  As directed    Call MD for:  severe uncontrolled pain    Complete by:  As directed    Call MD for:  temperature >100.4    Complete by:  As directed    Diet - low sodium heart healthy    Complete by:  As directed    Discharge instructions    Complete by:  As directed    Discharge instructions    Complete by:  As directed    Follow up with AF clinic  Follow up with PCP in 3-5 days Keep self well hydrated  If any signs of  bleeding go to ER   Increase activity slowly    Complete by:  As directed        Medication List    STOP taking these medications   aspirin EC 81 MG tablet   atenolol 25 MG tablet Commonly known as:  TENORMIN   atenolol 50 MG tablet Commonly known as:  TENORMIN     TAKE these medications   apixaban 5 MG Tabs tablet Commonly known as:  ELIQUIS Take 1 tablet (5 mg total) by mouth 2 (two) times daily.   metoprolol tartrate 25 MG tablet Commonly known as:  LOPRESSOR Take 1 tablet (25 mg total) by mouth 2 (two) times daily.   omega-3 acid ethyl esters 1 g capsule Commonly known as:  LOVAZA Take 1 g by mouth daily.   pravastatin 20 MG tablet Commonly known as:  PRAVACHOL Take 1 tablet (20 mg total) by mouth daily.      Follow-up Information    SETHI,PRAMOD, MD Follow up in 6 week(s).   Specialties:  Neurology, Radiology Why:  stroke clinic. office will call you with appt date and time Contact information: [address] [place] [address] [phone]        [facility] Follow up on 11/05/2015.   Specialty:  Cardiology Why:  at 10:30AM Contact information: [address] [id number] mc [place] [address] [phone]         Allergies  Allergen Reactions  . Simvastatin Other (See Comments)    Leg cramps    Consultations:  Cardiology - Dr Eden Emms  Neurology - Dr. Pearlean Brownie   Procedures/Studies: Ct Angio Head W Or Wo Contrast  Result Date: 10/28/2015 CLINICAL DATA:  Initial evaluation for acute stroke. Found have left PCA infarct on prior MRI. EXAM: CT ANGIOGRAPHY HEAD AND NECK TECHNIQUE: Multidetector CT imaging of the head and neck was performed using the standard protocol during bolus administration of intravenous contrast. Multiplanar CT image reconstructions and MIPs were obtained to evaluate the vascular anatomy. Carotid stenosis measurements (when applicable) are obtained utilizing  NASCET criteria, using the distal internal carotid diameter as the denominator. CONTRAST:  50 cc of Isovue 370. The CT could be obtained CTA chest 5 in MR the COMPARISON:  Previous MRI from earlier the same day. FINDINGS: CTA NECK FINDINGS Aortic arch: Visualized aortic arch of normal caliber with normal 3 vessel morphology. Mild scattered plaque within the proximal descending intrathoracic aorta and about the origin of the great vessels without high-grade stenosis. Visualized subclavian arteries are widely patent. Right carotid system: Right common carotid artery widely patent from its origin to the bifurcation. Mild eccentric calcified plaque about the right bifurcation without flow-limiting stenosis. Right ICA widely patent distally to the skullbase without stenosis, dissection, or occlusion.  Left carotid system: Left common carotid artery patent from its origin to the bifurcation. Mild eccentric plaque about the left bifurcation (slightly worse on the right) without flow-limiting stenosis. Left ICA patent distally to the skullbase without stenosis, dissection, or occlusion. Vertebral arteries: Both of the vertebral arteries arise from the subclavian arteries. Vertebral arteries patent within the neck without stenosis, dissection, or occlusion. Skeleton: No acute osseous abnormality. No worrisome lytic or blastic osseous lesions. Moderate multilevel degenerative spondylolysis noted within the visualized spine, greatest at C6-7. Other neck: Thyroid normal. No adenopathy within the neck. No acute soft tissue abnormality. Upper chest: Visualized mediastinum within normal limits. Visualized lungs are clear. Review of the MIP images confirms the above findings CTA HEAD FINDINGS Anterior circulation: Petrous, cavernous, and supraclinoid segments are widely patent bilaterally. ICA termini patent. A1 segments patent. Anterior communicating artery normal. Anterior cerebral arteries patent to their distal aspects and are  relatively normal in appearance. M1 segments patent without high-grade stenosis or occlusion. MCA bifurcations normal. No proximal M2 stenosis or occlusion. Distal MCA branches well opacified and symmetric bilaterally. Posterior circulation: Scattered atheromatous plaque within the V4 segments bilaterally with associated mild to moderate stenoses, slightly worse on the right (Series 502, image 246). Posterior inferior cerebral arteries patent bilaterally. Basilar artery widely patent to its distal aspect. Superior cerebral arteries patent bilaterally. Both of the posterior cerebral arteries primarily supplied via the basilar artery. Right PCA widely patent to its distal aspect without stenosis for significant atheromatous regularity. Left PCA patent proximally. Inferior left P3 segment attenuated and not well visualized distally, suspected to be possibly occluded given the acute left PCA infarct. Venous sinuses: Patent. Anatomic variants: No significant anatomic variant. No aneurysm or vascular malformation. Delayed phase: No pathologic enhancement. Review of the MIP images confirms the above findings IMPRESSION: CTA NECK IMPRESSION: 1. Mild eccentric plaque about the carotid bifurcations bilaterally without flow-limiting stenosis. No significant stenosis within the carotid arteries bilaterally. 2. Widely patent vertebral arteries within the neck. CTA HEAD IMPRESSION: 1. Attenuated and likely occluded left P3 segment, inferior division. 2. Focal atheromatous plaque within the V4 segments bilaterally with associated mild to moderate short-segment stenoses, slightly worse on the right. Otherwise widely patent posterior circulation. 3. Widely patent anterior circulation. Electronically Signed   By: Rise Mu M.D.   On: 10/28/2015 23:20   Dg Chest 2 View  Result Date: 10/28/2015 CLINICAL DATA:  70 y/o M; right eye blurriness and localize headache. EXAM: CHEST  2 VIEW COMPARISON:  09/07/2011 chest  radiograph FINDINGS: The heart size and mediastinal contours are within normal limits and stable. Both lungs are clear. Mild degenerative changes of thoracic spine. IMPRESSION: No active cardiopulmonary disease. Electronically Signed   By: Mitzi Hansen M.D.   On: 10/28/2015 02:41   Ct Head Wo Contrast  Result Date: 10/27/2015 CLINICAL DATA:  70 year old male with headache and blurry vision. EXAM: CT HEAD WITHOUT CONTRAST TECHNIQUE: Contiguous axial images were obtained from the base of the skull through the vertex without intravenous contrast. COMPARISON:  None. FINDINGS: Brain: No evidence of acute infarction, hemorrhage, hydrocephalus, extra-axial collection or mass lesion/mass effect. Vascular: No hyperdense vessel or unexpected calcification. Skull: Normal. Negative for fracture or focal lesion. Sinuses/Orbits: No acute finding. Other: None IMPRESSION: No acute intracranial pathology. Electronically Signed   By: Elgie Collard M.D.   On: 10/27/2015 23:57   Ct Angio Neck W Or Wo Contrast  Result Date: 10/28/2015 CLINICAL DATA:  Initial evaluation for acute stroke. Found have left PCA  infarct on prior MRI. EXAM: CT ANGIOGRAPHY HEAD AND NECK TECHNIQUE: Multidetector CT imaging of the head and neck was performed using the standard protocol during bolus administration of intravenous contrast. Multiplanar CT image reconstructions and MIPs were obtained to evaluate the vascular anatomy. Carotid stenosis measurements (when applicable) are obtained utilizing NASCET criteria, using the distal internal carotid diameter as the denominator. CONTRAST:  50 cc of Isovue 370. The CT could be obtained CTA chest 5 in MR the COMPARISON:  Previous MRI from earlier the same day. FINDINGS: CTA NECK FINDINGS Aortic arch: Visualized aortic arch of normal caliber with normal 3 vessel morphology. Mild scattered plaque within the proximal descending intrathoracic aorta and about the origin of the great vessels  without high-grade stenosis. Visualized subclavian arteries are widely patent. Right carotid system: Right common carotid artery widely patent from its origin to the bifurcation. Mild eccentric calcified plaque about the right bifurcation without flow-limiting stenosis. Right ICA widely patent distally to the skullbase without stenosis, dissection, or occlusion. Left carotid system: Left common carotid artery patent from its origin to the bifurcation. Mild eccentric plaque about the left bifurcation (slightly worse on the right) without flow-limiting stenosis. Left ICA patent distally to the skullbase without stenosis, dissection, or occlusion. Vertebral arteries: Both of the vertebral arteries arise from the subclavian arteries. Vertebral arteries patent within the neck without stenosis, dissection, or occlusion. Skeleton: No acute osseous abnormality. No worrisome lytic or blastic osseous lesions. Moderate multilevel degenerative spondylolysis noted within the visualized spine, greatest at C6-7. Other neck: Thyroid normal. No adenopathy within the neck. No acute soft tissue abnormality. Upper chest: Visualized mediastinum within normal limits. Visualized lungs are clear. Review of the MIP images confirms the above findings CTA HEAD FINDINGS Anterior circulation: Petrous, cavernous, and supraclinoid segments are widely patent bilaterally. ICA termini patent. A1 segments patent. Anterior communicating artery normal. Anterior cerebral arteries patent to their distal aspects and are relatively normal in appearance. M1 segments patent without high-grade stenosis or occlusion. MCA bifurcations normal. No proximal M2 stenosis or occlusion. Distal MCA branches well opacified and symmetric bilaterally. Posterior circulation: Scattered atheromatous plaque within the V4 segments bilaterally with associated mild to moderate stenoses, slightly worse on the right (Series 502, image 246). Posterior inferior cerebral arteries  patent bilaterally. Basilar artery widely patent to its distal aspect. Superior cerebral arteries patent bilaterally. Both of the posterior cerebral arteries primarily supplied via the basilar artery. Right PCA widely patent to its distal aspect without stenosis for significant atheromatous regularity. Left PCA patent proximally. Inferior left P3 segment attenuated and not well visualized distally, suspected to be possibly occluded given the acute left PCA infarct. Venous sinuses: Patent. Anatomic variants: No significant anatomic variant. No aneurysm or vascular malformation. Delayed phase: No pathologic enhancement. Review of the MIP images confirms the above findings IMPRESSION: CTA NECK IMPRESSION: 1. Mild eccentric plaque about the carotid bifurcations bilaterally without flow-limiting stenosis. No significant stenosis within the carotid arteries bilaterally. 2. Widely patent vertebral arteries within the neck. CTA HEAD IMPRESSION: 1. Attenuated and likely occluded left P3 segment, inferior division. 2. Focal atheromatous plaque within the V4 segments bilaterally with associated mild to moderate short-segment stenoses, slightly worse on the right. Otherwise widely patent posterior circulation. 3. Widely patent anterior circulation. Electronically Signed   By: Rise MuBenjamin  McClintock M.D.   On: 10/28/2015 23:20   Mr Brain Wo Contrast  Result Date: 10/28/2015 CLINICAL DATA:  70 year old male with headache, with 40 minutes of transient right eye vision loss. Initial encounter.  EXAM: MRI HEAD WITHOUT CONTRAST MRA HEAD WITHOUT CONTRAST TECHNIQUE: Multiplanar, multiecho pulse sequences of the brain and surrounding structures were obtained without intravenous contrast. Angiographic images of the head were obtained using MRA technique without contrast. COMPARISON:  Head CT without contrast 10/27/2015. FINDINGS: MRI HEAD FINDINGS Brain: Patchy and confluent restricted diffusion affecting the medial inferior left  occipital lobe along a distance of about 4.5-5 cm. This affects the calcarine region. Associated mild T2 and FLAIR hyperintensity compatible with cytotoxic edema. No associated hemorrhage or mass effect. No other restricted diffusion. No midline shift, mass effect, evidence of mass lesion, ventriculomegaly, extra-axial collection or acute intracranial hemorrhage. Cervicomedullary junction and pituitary are within normal limits. Elsewhere essentially normal for age gray and white matter signal throughout the brain. Deep gray matter nuclei, the brainstem, and cerebellum appear normal. No chronic cerebral blood products identified. Vascular: Major intracranial vascular flow voids are preserved. Skull and upper cervical spine: Negative. Normal bone marrow signal. Sinuses/Orbits: Visualized paranasal sinuses and mastoids are stable and well pneumatized. Bilateral orbits soft tissues in the optic chiasm appear normal. Negative scalp soft tissues. Other: Visible internal auditory structures appear normal. MRA HEAD FINDINGS Antegrade flow in the posterior circulation. Visible distal vertebral arteries appear normal without stenosis, the right is mildly dominant. Left PICA origin is normal. Patent basilar artery without stenosis. Dominant appearing right AICA origin is normal. Normal SCA and PCA origins. Posterior communicating arteries are diminutive or absent. The right PCA is normal. The proximal left PCA is normal. Suggestion of stenosis versus occlusion of the inferior left P3 division as seen on series 655, image 3. Other left PCA branches appear normal. Antegrade flow in both ICA siphons. No siphon stenosis. Ophthalmic artery origins are normal. Patent carotid termini. Normal left MCA and ACA origins. Mild irregularity at the right ICA terminus affecting both the right ACA and MCA origin (series 653, image 7). Anterior communicating artery and visualized ACA branches are normal. Left MCA M1 segment, bifurcation, and  visible left MCA branches are normal. Right MCA M1 segment, trifurcation, and visible right MCA branches are within normal limits. IMPRESSION: 1. Positive for acute patchy small to moderate size left PCA infarct. Mild cytotoxic edema with no associated hemorrhage or mass effect. 2. Negative for emergent large vessel occlusion, but associated left inferior P3 branch thromboembolic stenosis or occlusion suspected. Otherwise normal left PCA and posterior circulation. 3. Mild atherosclerosis at the right ICA terminus affecting the right MCA and ACA origins without significant stenosis. Otherwise negative anterior circulation. 4. Otherwise negative for age noncontrast MRI appearance of the brain. Electronically Signed   By: Odessa Fleming M.D.   On: 10/28/2015 09:24   Mr Maxine Glenn Head/brain ZO Cm  Result Date: 10/28/2015 CLINICAL DATA:  70 year old male with headache, with 40 minutes of transient right eye vision loss. Initial encounter. EXAM: MRI HEAD WITHOUT CONTRAST MRA HEAD WITHOUT CONTRAST TECHNIQUE: Multiplanar, multiecho pulse sequences of the brain and surrounding structures were obtained without intravenous contrast. Angiographic images of the head were obtained using MRA technique without contrast. COMPARISON:  Head CT without contrast 10/27/2015. FINDINGS: MRI HEAD FINDINGS Brain: Patchy and confluent restricted diffusion affecting the medial inferior left occipital lobe along a distance of about 4.5-5 cm. This affects the calcarine region. Associated mild T2 and FLAIR hyperintensity compatible with cytotoxic edema. No associated hemorrhage or mass effect. No other restricted diffusion. No midline shift, mass effect, evidence of mass lesion, ventriculomegaly, extra-axial collection or acute intracranial hemorrhage. Cervicomedullary junction and pituitary are within  normal limits. Elsewhere essentially normal for age gray and white matter signal throughout the brain. Deep gray matter nuclei, the brainstem, and  cerebellum appear normal. No chronic cerebral blood products identified. Vascular: Major intracranial vascular flow voids are preserved. Skull and upper cervical spine: Negative. Normal bone marrow signal. Sinuses/Orbits: Visualized paranasal sinuses and mastoids are stable and well pneumatized. Bilateral orbits soft tissues in the optic chiasm appear normal. Negative scalp soft tissues. Other: Visible internal auditory structures appear normal. MRA HEAD FINDINGS Antegrade flow in the posterior circulation. Visible distal vertebral arteries appear normal without stenosis, the right is mildly dominant. Left PICA origin is normal. Patent basilar artery without stenosis. Dominant appearing right AICA origin is normal. Normal SCA and PCA origins. Posterior communicating arteries are diminutive or absent. The right PCA is normal. The proximal left PCA is normal. Suggestion of stenosis versus occlusion of the inferior left P3 division as seen on series 655, image 3. Other left PCA branches appear normal. Antegrade flow in both ICA siphons. No siphon stenosis. Ophthalmic artery origins are normal. Patent carotid termini. Normal left MCA and ACA origins. Mild irregularity at the right ICA terminus affecting both the right ACA and MCA origin (series 653, image 7). Anterior communicating artery and visualized ACA branches are normal. Left MCA M1 segment, bifurcation, and visible left MCA branches are normal. Right MCA M1 segment, trifurcation, and visible right MCA branches are within normal limits. IMPRESSION: 1. Positive for acute patchy small to moderate size left PCA infarct. Mild cytotoxic edema with no associated hemorrhage or mass effect. 2. Negative for emergent large vessel occlusion, but associated left inferior P3 branch thromboembolic stenosis or occlusion suspected. Otherwise normal left PCA and posterior circulation. 3. Mild atherosclerosis at the right ICA terminus affecting the right MCA and ACA origins  without significant stenosis. Otherwise negative anterior circulation. 4. Otherwise negative for age noncontrast MRI appearance of the brain. Electronically Signed   By: Odessa Fleming M.D.   On: 10/28/2015 09:24     MRI  left PCA infarct  MRA  left P3 stenosis, otherwise normal  CTA head and neck  Left PCA occlusion, otherwise unremarkable  2D Echo  EF 50-55%,  Apical akinesis,   TEE small PFO. Went into atrial fibrillation during the TEE  Discharge Exam: Vitals:   11/01/15 0816 11/01/15 1207  BP: (!) 154/81 (!) 158/71  Pulse: 60 (!) 49  Resp: 18 18  Temp: 97.7 F (36.5 C) 97.9 F (36.6 C)   Vitals:   10/31/15 2008 10/31/15 2351 11/01/15 0816 11/01/15 1207  BP: 127/85 (!) 144/95 (!) 154/81 (!) 158/71  Pulse: 62 80 60 (!) 49  Resp: 18 18 18 18   Temp: 97.4 F (36.3 C) 97.5 F (36.4 C) 97.7 F (36.5 C) 97.9 F (36.6 C)  TempSrc: Oral Oral Oral Oral  SpO2: 97% 98% 98% 98%  Weight:      Height:        General: Pt is alert, awake, not in acute distress Cardiovascular: RRR, S1/S2 +, no rubs, no gallops Respiratory: CTA bilaterally, no wheezing, no rhonchi Abdominal: Soft, NT, ND, bowel sounds + Extremities: no edema, no cyanosis   The results of significant diagnostics from this hospitalization (including imaging, microbiology, ancillary and laboratory) are listed below for reference.     Microbiology: Recent Results (from the past 240 hour(s))  Surgical PCR screen     Status: None   Collection Time: 10/30/15  8:24 PM  Result Value Ref Range Status  MRSA, PCR NEGATIVE NEGATIVE Final   Staphylococcus aureus NEGATIVE NEGATIVE Final    Comment:        The Xpert SA Assay (FDA approved for NASAL specimens in patients over 70 years of age), is one component of a comprehensive surveillance program.  Test performance has been validated by [facility] for patients greater than or equal to 35 year old. It is not intended to diagnose infection nor to guide or monitor  treatment.      Labs: BNP (last 3 results) No results for input(s): BNP in the last 8760 hours. Basic Metabolic Panel:  Recent Labs Lab 10/27/15 2238 10/27/15 2249  NA 139 142  K 3.6 3.6  CL 105 102  CO2 25  --   GLUCOSE 101* 99  BUN 19 19  CREATININE 0.94 1.00  CALCIUM 9.1  --    Liver Function Tests:  Recent Labs Lab 10/27/15 2238  AST 20  ALT 21  ALKPHOS 47  BILITOT 1.0  PROT 7.3  ALBUMIN 4.4   No results for input(s): LIPASE, AMYLASE in the last 168 hours. No results for input(s): AMMONIA in the last 168 hours. CBC:  Recent Labs Lab 10/27/15 2238 10/27/15 2249 11/01/15 0604  WBC 6.5  --  7.3  NEUTROABS 3.3  --   --   HGB 15.5 15.0 15.7  HCT 43.0 44.0 45.1  MCV 83.7  --  85.7  PLT 185  --  169   Cardiac Enzymes: No results for input(s): CKTOTAL, CKMB, CKMBINDEX, TROPONINI in the last 168 hours. BNP: Invalid input(s): POCBNP CBG: No results for input(s): GLUCAP in the last 168 hours. D-Dimer No results for input(s): DDIMER in the last 72 hours. Hgb A1c No results for input(s): HGBA1C in the last 72 hours. Lipid Profile No results for input(s): CHOL, HDL, LDLCALC, TRIG, CHOLHDL, LDLDIRECT in the last 72 hours. Thyroid function studies No results for input(s): TSH, T4TOTAL, T3FREE, THYROIDAB in the last 72 hours.  Invalid input(s): FREET3 Anemia work up No results for input(s): VITAMINB12, FOLATE, FERRITIN, TIBC, IRON, RETICCTPCT in the last 72 hours. Urinalysis    Component Value Date/Time   COLORURINE YELLOW 10/27/2015 2322   APPEARANCEUR CLEAR 10/27/2015 2322   LABSPEC 1.014 10/27/2015 2322   PHURINE 5.5 10/27/2015 2322   GLUCOSEU NEGATIVE 10/27/2015 2322   HGBUR LARGE (A) 10/27/2015 2322   HGBUR 3+ 11/26/2009 0814   BILIRUBINUR NEGATIVE 10/27/2015 2322   BILIRUBINUR Negative 09/10/2014 0945   KETONESUR NEGATIVE 10/27/2015 2322   PROTEINUR NEGATIVE 10/27/2015 2322   UROBILINOGEN 0.2 09/10/2014 0945   UROBILINOGEN 1.0 09/07/2011  2208   NITRITE NEGATIVE 10/27/2015 2322   LEUKOCYTESUR NEGATIVE 10/27/2015 2322   Sepsis Labs Invalid input(s): PROCALCITONIN,  WBC,  LACTICIDVEN Microbiology Recent Results (from the past 240 hour(s))  Surgical PCR screen     Status: None   Collection Time: 10/30/15  8:24 PM  Result Value Ref Range Status   MRSA, PCR NEGATIVE NEGATIVE Final   Staphylococcus aureus NEGATIVE NEGATIVE Final    Comment:        The Xpert SA Assay (FDA approved for NASAL specimens in patients over 5 years of age), is one component of a comprehensive surveillance program.  Test performance has been validated by [facility] for patients greater than or equal to 53 year old. It is not intended to diagnose infection nor to guide or monitor treatment.     Time coordinating discharge: Over 30 minutes  SIGNED:  Latrelle Dodrill, MD  [facility] 11/01/2015, 1:53 PM Pager   If 7PM-7AM, please contact night-coverage [email or web address] Password [email or web address]

## 2015-11-01 NOTE — Progress Notes (Signed)
Noticed pt appeared to be in sinus rhythm on the monitor. Called CCMD that advised pt converted around 215 am. Obtained EKG that confirmed pt is sinus brady. Paged Cardiology to advise.

## 2015-11-01 NOTE — Care Management Note (Signed)
Case Management Note  Patient Details  Name: Windy Cannyrthur D Purkey MRN: [id number] Date of Birth: [birth year]  Subjective/Objective: Pt presented for with blurry right eye and localized headache on the left. Per PT recommendations no PT F/U.                     Action/Plan: Benefits check in process for Eliquis. CM will make pt aware once completed. CM will provide pt with 30 day free once benefit check completed.   Expected Discharge Date:                  Expected Discharge Plan:  Home/Self Care  In-House Referral:  NA  Discharge planning Services  CM Consult, Medication Assistance  Post Acute Care Choice:    Choice offered to:     DME Arranged:    DME Agency:     HH Arranged:    HH Agency:     Status of Service:  In process, will continue to follow  If discussed at Long Length of Stay Meetings, dates discussed:    Additional Comments:  Gala LewandowskyGraves-Bigelow, Ranier Coach Kaye, RN 11/01/2015, 1:16 PM

## 2015-11-05 ENCOUNTER — Ambulatory Visit (HOSPITAL_COMMUNITY)
Admission: RE | Admit: 2015-11-05 | Discharge: 2015-11-05 | Disposition: A | Discharge status: Home / Self Care | Payer: BC Managed Care – PPO | Source: Ambulatory Visit | Attending: Nurse Practitioner | Admitting: Nurse Practitioner

## 2015-11-05 ENCOUNTER — Encounter (HOSPITAL_COMMUNITY): Payer: Self-pay | Admitting: Nurse Practitioner

## 2015-11-05 VITALS — BP 164/94 | HR 64 | Ht 68.0 in | Wt 199.8 lb

## 2015-11-05 DIAGNOSIS — I10 Essential (primary) hypertension: Secondary | ICD-10-CM | POA: Diagnosis not present

## 2015-11-05 DIAGNOSIS — I4891 Unspecified atrial fibrillation: Secondary | ICD-10-CM | POA: Diagnosis present

## 2015-11-05 DIAGNOSIS — Z7901 Long term (current) use of anticoagulants: Secondary | ICD-10-CM | POA: Diagnosis not present

## 2015-11-05 DIAGNOSIS — Z79899 Other long term (current) drug therapy: Secondary | ICD-10-CM | POA: Insufficient documentation

## 2015-11-05 DIAGNOSIS — E785 Hyperlipidemia, unspecified: Secondary | ICD-10-CM | POA: Insufficient documentation

## 2015-11-05 DIAGNOSIS — I480 Paroxysmal atrial fibrillation: Secondary | ICD-10-CM | POA: Diagnosis not present

## 2015-11-05 NOTE — Progress Notes (Signed)
Primary Care Physician: [name], MD Referring Physician: [facility] f/u   Joel Payne is a 70 y.o. male with a h/o hypertension and hyperlipidemia who presented to the emergency department 10/20 with right-sided vision loss and left frontal headache. Patient reports that he had been in his usual state of health throughout the day with no recent illness and no recent trauma. At approximately 9:45 PM on the day of his presentation, patient noted vision loss affecting the right half of his visual field. He isn't sure if the vision loss was in one eye or both. He was working at his home computer when he was suddenly unable to see the right half of the screen. Vision seemed to return to normal within 20-30 minutes, by which time he had developed a headache behind the left eye. There was no change in his hearing, no loss of coordination, no speech disturbance, and no focal numbness or weakness. Patient has never experienced anything similar previously. He is a nonsmoker without diabetes or significant vascular disease, though he is known to have hypertension and hyperlipidemia. Visual deficits had resolved prior to coming to the emergency department. 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 Patient had an ECHO which showed EF 50-55%, with apical akinesis, subsequently TEE was done where he was found to be in Afib, and a small PFO. Patient was started on Amio drip, rhythm was converted, also started on A/C with eliquis. Patient stable with no neurological deficits. Amiodarone was d/ced. D/c home on rate control medication, Metoprolol 25mg  BID and anticoagulation with Eliquis.   On f/u in the afib clinic, he is doing well without any further neuro deficients or symptoms suggestive of afib. He continues on BB, OAC without bleeding issues. Bleeding precautions discussed. Lifestyle issues reviewed and no significant alcohol use, denies sleep apnea, no significant caffeine  use. No regular exercise. 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 Has noted that BP is running higher at home, but was told in the hospital that they did not want to tightly control BP for a few weeks.   Today, he denies symptoms of palpitations, chest pain, shortness of breath, orthopnea, PND, lower extremity edema, dizziness, presyncope, syncope, or neurologic sequela. The patient is tolerating medications without difficulties and is otherwise without complaint today.   Past Medical History:  Diagnosis Date  . ALLERGIC RHINITIS 11/08/2006  . BENIGN PROSTATIC HYPERTROPHY 10/12/2006  . CORONARY ARTERY DISEASE 10/12/2006   Catheterization was normal 2012, mild nonobstructive coronary disease, normal LV function  . HYPERLIPIDEMIA 11/08/2006  . HYPERTENSION 10/12/2006  . Kidney stone    in 1980  . 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 Shortness of breath    April, 2012  /  catheterization May, 2012 mild nonobstructive coronary disease  . Stroke (HCC)   . TIA (transient ischemic attack) 10/2015  . Urinary frequency 10/18/2009   Past Surgical History:  Procedure Laterality Date  . CARDIAC CATHETERIZATION  2012  . non radical prostatectomy  08/2011  . PROSTATE BIOPSY  2008  . TEE WITHOUT CARDIOVERSION N/A 10/31/2015   Procedure: TRANSESOPHAGEAL ECHOCARDIOGRAM (TEE) will alos have loop;  Surgeon: Wendall Stade, MD;  [facility]: [facility] ENDOSCOPY;  Service: Cardiovascular;  Laterality: N/A;  . TONSILLECTOMY      Current Outpatient Prescriptions  Medication Sig Dispense Refill  . apixaban (ELIQUIS) 5 MG TABS tablet Take 1 tablet (5 mg total) by mouth 2 (  two) times daily. 60 tablet 1  . metoprolol tartrate (LOPRESSOR) 25 MG tablet Take 1 tablet (25 mg total) by mouth 2 (two) times daily. 60 tablet 0  . omega-3 acid ethyl esters (LOVAZA) 1 G capsule Take 1 g by mouth daily.    . pravastatin (PRAVACHOL) 20 MG tablet Take 1 tablet (20 mg total) by mouth daily. 90 tablet 3    No current facility-administered medications for this encounter.     Allergies  Allergen Reactions  . Simvastatin Other (See Comments)    Leg cramps    Social History   Social History  . Marital status: Single    Spouse name: N/A  . Number of children: N/A  . Years of education: N/A   Occupational History  . Not on file.   Social History Main Topics  . Smoking status: Never Smoker  . Smokeless tobacco: Never Used  . Alcohol use 0.5 oz/week    1 drink(s) per week     Comment: occasionally  . Drug use: No  . Sexual activity: Not on file   Other Topics Concern  . Not on file   Social History Narrative  . No narrative on file    Family History  Problem Relation Age of Onset  . Colon cancer Neg Hx   . Esophageal cancer Neg Hx   . Rectal cancer Neg Hx   . Stomach cancer Neg Hx     ROS- All systems are reviewed and negative except as per the HPI above  Physical Exam: Vitals:   11/05/15 1006  BP: (!) 164/94  Pulse: 64  Weight: 199 lb 12.8 oz (90.6 kg)  Height: 5\' 8"  (1.727 m)    GEN- The patient is well appearing, alert and oriented x 3 today.   Head- normocephalic, atraumatic Eyes-  Sclera clear, conjunctiva pink Ears- hearing intact Oropharynx- clear Neck- supple, no JVP Lymph- no cervical lymphadenopathy Lungs- Clear to ausculation bilaterally, normal work of breathing Heart- Regular rate and rhythm, no murmurs, rubs or gallops, PMI not laterally displaced GI- soft, NT, ND, + BS Extremities- no clubbing, cyanosis, or edema MS- no significant deformity or atrophy Skin- no rash or lesion Psych- euthymic mood, full affect Neuro- strength and sensation are intact  EKG-NSR at 64 bpm, pr int 134 ms, qrs int 88 ms, qtc 466 ms Epic records reviewed Echo-- Left ventricle: The cavity size was normal. There was mild   concentric hypertrophy. Systolic function was normal. The   estimated ejection fraction was in the range of 50% to 55%. There   is severe  hypokinesis of the mid-apical inferolateral myocardium.   In some views there is a suggestion of focal apical akinesis as   well but the apex is not well visualized. Recommend definity   contrast study to evaluate the apex further as well as rule out   apicl thrombus since patient has had a TIA. Features are   consistent with a pseudonormal left ventricular filling pattern,   with concomitant abnormal relaxation and increased filling   pressure (grade 2 diastolic dysfunction). - Aortic valve: Trileaflet; mildly thickened, mildly calcified   leaflets. There was mild regurgitation. Regurgitation pressure   half-time: 700 ms. - Aorta: Ascending aorta diameter: 40 mm (ED). - Ascending aorta: The ascending aorta was mildly dilated. - Mitral valve: There was trivial regurgitation. - Left atrium: The atrium was mildly dilated. - Tricuspid valve: There was mild regurgitation. - Pulmonic valve: There was trivial regurgitation.   Assessment and  Plan:     1. Paroxysmal afib, new onset in setting of left PCA infarct Currently in SR Continue metoprolol tartrate 25 mg bid Continue eliquis 5 mg bid,for chadsvasc score of 4, bleeding precautions discussed   2. HTN Continue to follow BPHR tid at home IF continues to be elevated, inform PCP to see if adjustment of meds is needed  afib clinic as needed Dr. Amador CunasKwiatkowski as scheduled 11/13   Elvina Sidleonna C. Matthew Folksarroll, ANP-C [facility] [address] [place], [place] [address] [phone]

## 2015-11-18 ENCOUNTER — Other Ambulatory Visit (INDEPENDENT_AMBULATORY_CARE_PROVIDER_SITE_OTHER): Payer: BC Managed Care – PPO

## 2015-11-18 DIAGNOSIS — Z0000 Encounter for general adult medical examination without abnormal findings: Secondary | ICD-10-CM

## 2015-11-18 LAB — LIPID PANEL
CHOL/HDL RATIO: 5
Cholesterol: 177 mg/dL (ref 0–200)
HDL: 35.8 mg/dL — AB (ref >39.00)
LDL CALC: 113 mg/dL — AB (ref 0–99)
NONHDL: 141.11
Triglycerides: 140 mg/dL (ref 0.0–149.0)
VLDL: 28 mg/dL (ref 0.0–40.0)

## 2015-11-18 LAB — CBC WITH DIFFERENTIAL/PLATELET
BASOS ABS: 0 10*3/uL (ref 0.0–0.1)
Basophils Relative: 0.7 % (ref 0.0–3.0)
EOS ABS: 0.4 10*3/uL (ref 0.0–0.7)
Eosinophils Relative: 7.8 % — ABNORMAL HIGH (ref 0.0–5.0)
HCT: 45.9 % (ref 39.0–52.0)
Hemoglobin: 15.7 g/dL (ref 13.0–17.0)
LYMPHS ABS: 1.4 10*3/uL (ref 0.7–4.0)
Lymphocytes Relative: 27.5 % (ref 12.0–46.0)
MCHC: 34.2 g/dL (ref 30.0–36.0)
MCV: 86.8 fl (ref 78.0–100.0)
Monocytes Absolute: 0.4 10*3/uL (ref 0.1–1.0)
Monocytes Relative: 8.5 % (ref 3.0–12.0)
NEUTROS ABS: 2.9 10*3/uL (ref 1.4–7.7)
NEUTROS PCT: 55.5 % (ref 43.0–77.0)
PLATELETS: 177 10*3/uL (ref 150.0–400.0)
RBC: 5.29 Mil/uL (ref 4.22–5.81)
RDW: 13.4 % (ref 11.5–15.5)
WBC: 5.2 10*3/uL (ref 4.0–10.5)

## 2015-11-18 LAB — HEPATIC FUNCTION PANEL
ALK PHOS: 46 U/L (ref 39–117)
ALT: 17 U/L (ref 0–53)
AST: 16 U/L (ref 0–37)
Albumin: 4.2 g/dL (ref 3.5–5.2)
BILIRUBIN DIRECT: 0.2 mg/dL (ref 0.0–0.3)
BILIRUBIN TOTAL: 0.8 mg/dL (ref 0.2–1.2)
Total Protein: 6.4 g/dL (ref 6.0–8.3)

## 2015-11-18 LAB — TSH: TSH: 3.55 u[IU]/mL (ref 0.35–4.50)

## 2015-11-18 LAB — BASIC METABOLIC PANEL
BUN: 13 mg/dL (ref 6–23)
CALCIUM: 9.3 mg/dL (ref 8.4–10.5)
CO2: 31 meq/L (ref 19–32)
CREATININE: 0.8 mg/dL (ref 0.40–1.50)
Chloride: 105 mEq/L (ref 96–112)
GFR: 101.39 mL/min (ref >60.00)
GLUCOSE: 86 mg/dL (ref 70–99)
Potassium: 4.1 mEq/L (ref 3.5–5.1)
Sodium: 143 mEq/L (ref 135–145)

## 2015-11-18 LAB — POC URINALSYSI DIPSTICK (AUTOMATED)
BILIRUBIN UA: NEGATIVE
GLUCOSE UA: NEGATIVE
KETONES UA: NEGATIVE
Leukocytes, UA: NEGATIVE
Nitrite, UA: NEGATIVE
SPEC GRAV UA: 1.015
UROBILINOGEN UA: 1
pH, UA: 8

## 2015-11-18 LAB — PSA: PSA: 0.38 ng/mL (ref 0.10–4.00)

## 2015-11-25 ENCOUNTER — Ambulatory Visit (INDEPENDENT_AMBULATORY_CARE_PROVIDER_SITE_OTHER): Payer: BC Managed Care – PPO | Admitting: Internal Medicine

## 2015-11-25 ENCOUNTER — Encounter: Payer: Self-pay | Admitting: Internal Medicine

## 2015-11-25 VITALS — BP 130/90 | HR 67 | Temp 97.9°F | Ht 67.5 in | Wt 200.0 lb

## 2015-11-25 DIAGNOSIS — Z0000 Encounter for general adult medical examination without abnormal findings: Secondary | ICD-10-CM | POA: Diagnosis not present

## 2015-11-25 NOTE — Patient Instructions (Signed)
Limit your sodium (Salt) intake    It is important that you exercise regularly, at least 20 minutes 3 to 4 times per week.  If you develop chest pain or shortness of breath seek  medical attention.  Please check your blood pressure on a regular basis.  If it is consistently greater than 150/90, please make an office appointment.  Return in 6 months for follow-up   

## 2015-11-25 NOTE — Progress Notes (Signed)
Subjective:    Patient ID: Joel Payne, male    DOB: Aug 27, 1945, 70 y.o.   MRN: [id number]  HPI  70 year old patient who is seen today for a preventive health examination. He was admitted to the hospital one month ago for evaluation of L HH and noted to have a embolic left PCA stroke.  The patient was noted have PAF during a TEE and plans for a loop recorder were terminated.  Aspirin therapy is discontinued and now remains on chronic anticoagulation He has essential hypertension and dyslipidemia.  He remains on statin therapy And does have a prior history of nonobstructive coronary artery disease  Past Medical History:  Diagnosis Date  . ALLERGIC RHINITIS 11/08/2006  . BENIGN PROSTATIC HYPERTROPHY 10/12/2006  . CORONARY ARTERY DISEASE 10/12/2006   Catheterization was normal 2012, mild nonobstructive coronary disease, normal LV function  . HYPERLIPIDEMIA 11/08/2006  . HYPERTENSION 10/12/2006  . Kidney stone    in 1980  . 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 Shortness of breath    April, 2012  /  catheterization May, 2012 mild nonobstructive coronary disease  . Stroke (HCC)   . TIA (transient ischemic attack) 10/2015  . Urinary frequency 10/18/2009     Social History   Social History  . Marital status: Single    Spouse name: N/A  . Number of children: N/A  . Years of education: N/A   Occupational History  . Not on file.   Social History Main Topics  . Smoking status: Never Smoker  . Smokeless tobacco: Never Used  . Alcohol use 0.5 oz/week    1 drink(s) per week     Comment: occasionally  . Drug use: No  . Sexual activity: Not on file   Other Topics Concern  . Not on file   Social History Narrative  . No narrative on file    Past Surgical History:  Procedure Laterality Date  . CARDIAC CATHETERIZATION  2012  . non radical prostatectomy  08/2011  . PROSTATE BIOPSY  2008  . TEE WITHOUT CARDIOVERSION N/A 10/31/2015   Procedure: TRANSESOPHAGEAL  ECHOCARDIOGRAM (TEE) will alos have loop;  Surgeon: Wendall StadePeter C Nishan, MD;  [facility];  Service: Cardiovascular;  Laterality: N/A;  . TONSILLECTOMY      Family History  Problem Relation Age of Onset  . Colon cancer Neg Hx   . Esophageal cancer Neg Hx   . Rectal cancer Neg Hx   . Stomach cancer Neg Hx     Allergies  Allergen Reactions  . Simvastatin Other (See Comments)    Leg cramps    Current Outpatient Prescriptions on File Prior to Visit  Medication Sig Dispense Refill  . apixaban (ELIQUIS) 5 MG TABS tablet Take 1 tablet (5 mg total) by mouth 2 (two) times daily. 60 tablet 1  . metoprolol tartrate (LOPRESSOR) 25 MG tablet Take 1 tablet (25 mg total) by mouth 2 (two) times daily. 60 tablet 0  . omega-3 acid ethyl esters (LOVAZA) 1 G capsule Take 1 g by mouth daily.    . pravastatin (PRAVACHOL) 20 MG tablet Take 1 tablet (20 mg total) by mouth daily. 90 tablet 3   No current facility-administered medications on file prior to visit.     BP 130/90 (BP Location: Right Arm, Patient Position: Sitting, Cuff Size: Normal)   Pulse 67   Temp 97.9 F (36.6 C) (Oral)   Ht 5' 7.5" (1.715 m)   Wt 200 lb (  90.7 kg)   SpO2 97%   BMI 30.86 kg/m     Review of Systems  Constitutional: Negative for activity change, appetite change, chills, fatigue and fever.  HENT: Negative for congestion, dental problem, ear pain, hearing loss, mouth sores, rhinorrhea, sinus pressure, sneezing, tinnitus, trouble swallowing and voice change.   Eyes: Positive for visual disturbance. Negative for photophobia, pain and redness.  Respiratory: Negative for apnea, cough, choking, chest tightness, shortness of breath and wheezing.   Cardiovascular: Negative for chest pain, palpitations and leg swelling.  Gastrointestinal: Negative for abdominal distention, abdominal pain, anal bleeding, blood in stool, constipation, diarrhea, nausea, rectal pain and vomiting.  Genitourinary: Negative for decreased  urine volume, difficulty urinating, discharge, dysuria, flank pain, frequency, genital sores, hematuria, penile swelling, scrotal swelling, testicular pain and urgency.  Musculoskeletal: Negative for arthralgias, back pain, gait problem, joint swelling, myalgias, neck pain and neck stiffness.  Skin: Negative for color change, rash and wound.  Neurological: Negative for dizziness, tremors, seizures, syncope, facial asymmetry, speech difficulty, weakness, light-headedness, numbness and headaches.  Hematological: Negative for adenopathy. Does not bruise/bleed easily.  Psychiatric/Behavioral: Negative for agitation, behavioral problems, confusion, decreased concentration, dysphoric mood, hallucinations, self-injury, sleep disturbance and suicidal ideas. The patient is not nervous/anxious.        Objective:   Physical Exam  Constitutional: He appears well-developed and well-nourished.  HENT:  Head: Normocephalic and atraumatic.  Right Ear: External ear normal.  Left Ear: External ear normal.  Nose: Nose normal.  Mouth/Throat: Oropharynx is clear and moist.  Eyes: Conjunctivae and EOM are normal. Pupils are equal, round, and reactive to light. No scleral icterus.  Suggestion of mild left-sided ptosis  Neck: Normal range of motion. Neck supple. No JVD present. No thyromegaly present.  Cardiovascular: Regular rhythm, normal heart sounds and intact distal pulses.  Exam reveals no gallop and no friction rub.   No murmur heard. Pedal pulses full  Pulmonary/Chest: Effort normal and breath sounds normal. He exhibits no tenderness.  Abdominal: Soft. Bowel sounds are normal. He exhibits no distension and no mass. There is no tenderness.  Genitourinary: Penis normal.  Musculoskeletal: Normal range of motion. He exhibits no edema or tenderness.  Lymphadenopathy:    He has no cervical adenopathy.  Neurological: He is alert. He has normal reflexes. No cranial nerve deficit. Coordination normal.  Skin:  Skin is warm and dry. No rash noted.  Psychiatric: He has a normal mood and affect. His behavior is normal.          Assessment & Plan:   .  Preventive health examination .  Mild dyslipidemia.  Patient has been on statin therapy in the past due to history of nonobstructive CAD.  Indications now also include a history of a left PCA stroke  essential hypertension, well-controlled .  Paroxysmal atrial fibrillation.  Complications include embolic stroke.  Will continue chronic anticoagulation , history of state cancer  .  Follow-up neurology and cardiology .  No change in medical therapy   .  Follow-up 6 months  Rogelia BogaKWIATKOWSKI,PETER FRANK

## 2015-11-25 NOTE — Progress Notes (Signed)
Pre visit review using our clinic review tool, if applicable. No additional management support is needed unless otherwise documented below in the visit note. 

## 2015-11-27 ENCOUNTER — Other Ambulatory Visit: Payer: Self-pay | Admitting: Internal Medicine

## 2015-12-03 ENCOUNTER — Other Ambulatory Visit: Payer: Self-pay | Admitting: Internal Medicine

## 2015-12-16 ENCOUNTER — Ambulatory Visit (INDEPENDENT_AMBULATORY_CARE_PROVIDER_SITE_OTHER): Payer: BC Managed Care – PPO | Admitting: Neurology

## 2015-12-16 ENCOUNTER — Encounter: Payer: Self-pay | Admitting: Neurology

## 2015-12-16 VITALS — BP 150/80 | HR 65 | Ht 68.0 in | Wt 200.1 lb

## 2015-12-16 DIAGNOSIS — I480 Paroxysmal atrial fibrillation: Secondary | ICD-10-CM

## 2015-12-16 DIAGNOSIS — I63433 Cerebral infarction due to embolism of bilateral posterior cerebral arteries: Secondary | ICD-10-CM | POA: Diagnosis not present

## 2015-12-16 NOTE — Progress Notes (Signed)
[facility] [address] [place]. [place] [phone]       OFFICE FOLLOW-UP NOTE  [name] Date of Birth:  [birth year] Medical Record Number:  [id number]   HPI: [name] is a 70 year old Caucasian male seen today for first office follow-up visit following hospital admission for stroke in October 2017. Joel Headsrthur D Murphyis a 70 y.o.malereferred for consult of TIA.PMHx of CAD, HLD, HTN, nephrolithiasis,. Patient presented with a blurry right eye with localized headache in the left eye without weakness, numbness, paresthesias. Occurred 930 PM last evening (10/27/2015) with improvement. No Hx of migraines. He is on ASA 81mg  and endorses compliance. He lost vision in the right eye for about 40 minutes, just the right eye and just the right half. About 930 and then developed a left-sided headache. At the temple. No weakness, no sensory changes, no recent illnesses or new medications, not light headed or SOB or any other associated symptoms, no hx of stroke. Now he is completely fine. He is compliant with medications.  Patient was last known well at 9:30 PM on 10/28/2015. Patient was not administered IV t-PA secondary to deficits resolved. He was admitted for further evaluation and treatment. 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 Transthoracic echo showed normal ejection fraction but a Vicryl a cane assist and thrombus cannot be ruled out. Patient been for transesophageal echocardiogram that showed a small PFO but patient went into atrial fibrillation during the procedure. LDL cholesterol was elevated at 120 mg percent and hemoglobin A1c was 5.4. Patient was started on eliquis for anticoagulation given high Italychad 2 vasc score of 4. He states his done well since discharge. He has no headaches. His peripheral vision in fact has  improved. [facility] and visual fields were found to be adequate. He was advised to drive and has been doing so for last few weeks without incident. He starting eliquis pelvic without bleeding or bruising. He is also on Crestor and denies any muscle aches and pains. He has been in fact no new complaints today and feels his back to his baseline. ROS:   14 system review of systems is positive for  mild vision difficulties only and all systems negative  PMH:  Past Medical History:  Diagnosis Date  . ALLERGIC RHINITIS 11/08/2006  . BENIGN PROSTATIC HYPERTROPHY 10/12/2006  . CORONARY ARTERY DISEASE 10/12/2006   Catheterization was normal 2012, mild nonobstructive coronary disease, normal LV function  . HYPERLIPIDEMIA 11/08/2006  . HYPERTENSION 10/12/2006  . Kidney stone    in 1980  . 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 Shortness of breath    April, 2012  /  catheterization May, 2012 mild nonobstructive coronary disease  . Stroke (HCC)   . TIA (transient ischemic attack) 10/2015  . Urinary frequency 10/18/2009    Social History:  Social History   Social History  . Marital status: Single    Spouse name: N/A  . Number of children: 3  . Years of education: PhD   Occupational History  . Anthropology    Social History Main Topics  . Smoking status: Never Smoker  . Smokeless tobacco: Never Used  . Alcohol use No  . Drug use: No  . Sexual activity: Not on file   Other Topics Concern  .  Not on file   Social History Narrative   Lives with significant other   Caffeine use: Coffee- 2 cups decaf per day   No tea/soda    Medications:   Current Outpatient Prescriptions on File Prior to Visit  Medication Sig Dispense Refill  . apixaban (ELIQUIS) 5 MG TABS tablet Take 1 tablet (5 mg total) by mouth 2 (two) times daily. 60 tablet 1  . metoprolol tartrate (LOPRESSOR) 25 MG tablet TAKE 1 TABLET TWICE A DAY 60 tablet 3    . omega-3 acid ethyl esters (LOVAZA) 1 G capsule Take 1 g by mouth daily.    . pravastatin (PRAVACHOL) 20 MG tablet TAKE 1 TABLET (20 MG TOTAL) BY MOUTH DAILY. 90 tablet 2   No current facility-administered medications on file prior to visit.     Allergies:   Allergies  Allergen Reactions  . Simvastatin Other (See Comments)    Leg cramps    Physical Exam General: obese elderly caucasian maled, seated, in no evident distress Payne: Payne normocephalic and atraumatic.  Neck: supple with no carotid or supraclavicular bruits Cardiovascular: regular rate and rhythm, no murmurs Musculoskeletal: no deformity Skin:  no rash/petichiae Vascular:  Normal pulses all extremities Vitals:   12/16/15 1602  BP: (!) 150/80  Pulse: 65   Neurologic Exam Mental Status: Awake and fully alert. Oriented to place and time. Recent and remote memory intact. Attention span, concentration and fund of knowledge appropriate. Mood and affect appropriate.  Cranial Nerves: Fundoscopic exam reveals sharp disc margins. Pupils equal, briskly reactive to light. Extraocular movements full without nystagmus. Visual fields full to confrontation. Hearing intact. Facial sensation intact. Face, tongue, palate moves normally and symmetrically.  Motor: Normal bulk and tone. Normal strength in all tested extremity muscles. Sensory.: intact to touch ,pinprick .position and vibratory sensation.  Coordination: Rapid alternating movements normal in all extremities. Finger-to-nose and heel-to-shin performed accurately bilaterally. Gait and Station: Arises from chair without difficulty. Stance is normal. Gait demonstrates normal stride length and balance . Able to heel, toe and tandem walk with slight difficulty.  Reflexes: 1+ and symmetric. Toes downgoing.   NIHSS  0 Modified Rankin  1   ASSESSMENT: [age] year Caucasian male with embolic left posterior cerebral artery infarct and October 2017 secondary to new onset atrial  fibrillation. Vascular risk factors of hyperlipidemia, obesity and atrial fibrillation    PLAN: I had a long d/w patient about his recent embolic stroke,atrial fibrillation risk for recurrent stroke/TIAs, personally independently reviewed imaging studies and stroke evaluation results and answered questions.Continue Eliquis (apixaban) daily  for secondary stroke prevention and maintain strict control of hypertension with blood pressure goal below 130/90, diabetes with hemoglobin A1c goal below 6.5% and lipids with LDL cholesterol goal below 70 mg/dL. I also advised the patient to eat a healthy diet with plenty of whole grains, cereals, fruits and vegetables, exercise regularly and maintain ideal body weight. The patient may drive as his peripheral vision loss has improved. Followup in the future with my nurse practitioner in 6 months or call earlier if necessary. Greater than 50% of time during this 25 minute visit was spent on counseling,explanation of diagnosis, planning of further management, discussion with patient and family and coordination of care Delia HeadyPramod Ronnetta Currington, MD  [facility] [address] [place], [place] [id number]  Phone [phone] Fax [phone] Note: This document was prepared with digital dictation and possible smart phrase technology. Any transcriptional errors that result from this process are unintentional

## 2015-12-16 NOTE — Patient Instructions (Addendum)
Stroke Prevention Some medical conditions and behaviors are associated with an increased chance of having a stroke. You may prevent a stroke by making healthy choices and managing medical conditions. How can I reduce my risk of having a stroke?  Stay physically active. Get at least 30 minutes of activity on most or all days.  Do not smoke. It may also be helpful to avoid exposure to secondhand smoke.  Limit alcohol use. Moderate alcohol use is considered to be:  No more than 2 drinks per day for men.  No more than 1 drink per day for nonpregnant women.  Eat healthy foods. This involves:  Eating 5 or more servings of fruits and vegetables a day.  Making dietary changes that address high blood pressure (hypertension), high cholesterol, diabetes, or obesity.  Manage your cholesterol levels.  Making food choices that are high in fiber and low in saturated fat, trans fat, and cholesterol may control cholesterol levels.  Take any prescribed medicines to control cholesterol as directed by your health care provider.  Manage your diabetes.  Controlling your carbohydrate and sugar intake is recommended to manage diabetes.  Take any prescribed medicines to control diabetes as directed by your health care provider.  Control your hypertension.  Making food choices that are low in salt (sodium), saturated fat, trans fat, and cholesterol is recommended to manage hypertension.  Ask your health care provider if you need treatment to lower your blood pressure. Take any prescribed medicines to control hypertension as directed by your health care provider.  If you are 18-39 years of age, have your blood pressure checked every 3-5 years. If you are 40 years of age or older, have your blood pressure checked every year.  Maintain a healthy weight.  Reducing calorie intake and making food choices that are low in sodium, saturated fat, trans fat, and cholesterol are recommended to manage  weight.  Stop drug abuse.  Avoid taking birth control pills.  Talk to your health care provider about the risks of taking birth control pills if you are over 35 years old, smoke, get migraines, or have ever had a blood clot.  Get evaluated for sleep disorders (sleep apnea).  Talk to your health care provider about getting a sleep evaluation if you snore a lot or have excessive sleepiness.  Take medicines only as directed by your health care provider.  For some people, aspirin or blood thinners (anticoagulants) are helpful in reducing the risk of forming abnormal blood clots that can lead to stroke. If you have the irregular heart rhythm of atrial fibrillation, you should be on a blood thinner unless there is a good reason you cannot take them.  Understand all your medicine instructions.  Make sure that other conditions (such as anemia or atherosclerosis) are addressed. Get help right away if:  You have sudden weakness or numbness of the face, arm, or leg, especially on one side of the body.  Your face or eyelid droops to one side.  You have sudden confusion.  You have trouble speaking (aphasia) or understanding.  You have sudden trouble seeing in one or both eyes.  You have sudden trouble walking.  You have dizziness.  You have a loss of balance or coordination.  You have a sudden, severe headache with no known cause.  You have new chest pain or an irregular heartbeat. Any of these symptoms may represent a serious problem that is an emergency. Do not wait to see if the symptoms will go away.   Get medical help at once. Call your local emergency services (911 in [place] Do not drive yourself to the hospital. This information is not intended to replace advice given to you by your health care provider. Make sure you discuss any questions you have with your health care provider. Document Released: 02/06/2004 Document Revised: 06/06/2015 Document Reviewed: 07/01/2012 Elsevier  Interactive Patient Education  2017 Elsevier Inc.  

## 2015-12-25 ENCOUNTER — Other Ambulatory Visit (HOSPITAL_COMMUNITY): Payer: Self-pay | Admitting: *Deleted

## 2015-12-25 MED ORDER — APIXABAN 5 MG PO TABS: 5.0000 mg | ORAL_TABLET | Freq: Two times a day (BID) | ORAL | 2 refills | Status: DC

## 2016-03-20 ENCOUNTER — Other Ambulatory Visit: Payer: Self-pay | Admitting: Internal Medicine

## 2016-04-29 ENCOUNTER — Other Ambulatory Visit: Payer: Self-pay | Admitting: Internal Medicine

## 2016-04-29 MED ORDER — METOPROLOL TARTRATE 25 MG PO TABS: 25.0000 mg | ORAL_TABLET | Freq: Two times a day (BID) | ORAL | 3 refills | Status: DC

## 2016-06-12 ENCOUNTER — Ambulatory Visit: Payer: BC Managed Care – PPO | Admitting: Nurse Practitioner

## 2016-06-15 ENCOUNTER — Ambulatory Visit: Payer: BC Managed Care – PPO | Admitting: Nurse Practitioner

## 2016-06-29 ENCOUNTER — Ambulatory Visit: Payer: BC Managed Care – PPO | Admitting: Nurse Practitioner

## 2016-08-02 ENCOUNTER — Other Ambulatory Visit: Payer: Self-pay | Admitting: Internal Medicine

## 2016-08-05 ENCOUNTER — Encounter: Payer: Self-pay | Admitting: Nurse Practitioner

## 2016-08-05 ENCOUNTER — Ambulatory Visit (INDEPENDENT_AMBULATORY_CARE_PROVIDER_SITE_OTHER): Payer: BC Managed Care – PPO | Admitting: Nurse Practitioner

## 2016-08-05 VITALS — BP 126/74 | HR 62 | Ht 68.0 in | Wt 198.2 lb

## 2016-08-05 DIAGNOSIS — I639 Cerebral infarction, unspecified: Secondary | ICD-10-CM

## 2016-08-05 DIAGNOSIS — I480 Paroxysmal atrial fibrillation: Secondary | ICD-10-CM

## 2016-08-05 DIAGNOSIS — I10 Essential (primary) hypertension: Secondary | ICD-10-CM | POA: Diagnosis not present

## 2016-08-05 DIAGNOSIS — E785 Hyperlipidemia, unspecified: Secondary | ICD-10-CM

## 2016-08-05 NOTE — Patient Instructions (Addendum)
Stressed the importance of management of risk factors to prevent further stroke Continue eliquis for secondary stroke prevention and atrial fibrillation Maintain strict control of hypertension with blood pressure goal below 130/90, today's reading 126/74 continue antihypertensive medications Cholesterol with LDL cholesterol less than 70, followed by primary care,  continue statin drugs Pravachol Exercise by walking,   eat healthy diet with whole grains,  fresh fruits and vegetables Will discharge from stroke clinic Stroke Prevention Some medical conditions and behaviors are associated with an increased chance of having a stroke. You may prevent a stroke by making healthy choices and managing medical conditions. How can I reduce my risk of having a stroke?  Stay physically active. Get at least 30 minutes of activity on most or all days.  Do not smoke. It may also be helpful to avoid exposure to secondhand smoke.  Limit alcohol use. Moderate alcohol use is considered to be: ? No more than 2 drinks per day for men. ? No more than 1 drink per day for nonpregnant women.  Eat healthy foods. This involves: ? Eating 5 or more servings of fruits and vegetables a day. ? Making dietary changes that address high blood pressure (hypertension), high cholesterol, diabetes, or obesity.  Manage your cholesterol levels. ? Making food choices that are high in fiber and low in saturated fat, trans fat, and cholesterol may control cholesterol levels. ? Take any prescribed medicines to control cholesterol as directed by your health care provider.  Manage your diabetes. ? Controlling your carbohydrate and sugar intake is recommended to manage diabetes. ? Take any prescribed medicines to control diabetes as directed by your health care provider.  Control your hypertension. ? Making food choices that are low in salt (sodium), saturated fat, trans fat, and cholesterol is recommended to manage  hypertension. ? Ask your health care provider if you need treatment to lower your blood pressure. Take any prescribed medicines to control hypertension as directed by your health care provider. ? If you are 5018-71 years of age, have your blood pressure checked every 3-5 years. If you are 71 years of age or older, have your blood pressure checked every year.  Maintain a healthy weight. ? Reducing calorie intake and making food choices that are low in sodium, saturated fat, trans fat, and cholesterol are recommended to manage weight.  Stop drug abuse.  Avoid taking birth control pills. ? Talk to your health care provider about the risks of taking birth control pills if you are over 71 years old, smoke, get migraines, or have ever had a blood clot.  Get evaluated for sleep disorders (sleep apnea). ? Talk to your health care provider about getting a sleep evaluation if you snore a lot or have excessive sleepiness.  Take medicines only as directed by your health care provider. ? For some people, aspirin or blood thinners (anticoagulants) are helpful in reducing the risk of forming abnormal blood clots that can lead to stroke. If you have the irregular heart rhythm of atrial fibrillation, you should be on a blood thinner unless there is a good reason you cannot take them. ? Understand all your medicine instructions.  Make sure that other conditions (such as anemia or atherosclerosis) are addressed. Get help right away if:  You have sudden weakness or numbness of the face, arm, or leg, especially on one side of the body.  Your face or eyelid droops to one side.  You have sudden confusion.  You have trouble speaking (aphasia) or understanding.  You have sudden trouble seeing in one or both eyes.  You have sudden trouble walking.  You have dizziness.  You have a loss of balance or coordination.  You have a sudden, severe headache with no known cause.  You have new chest pain or an  irregular heartbeat. Any of these symptoms may represent a serious problem that is an emergency. Do not wait to see if the symptoms will go away. Get medical help at once. Call your local emergency services (911 in [place] Do not drive yourself to the hospital. This information is not intended to replace advice given to you by your health care provider. Make sure you discuss any questions you have with your health care provider. Document Released: 02/06/2004 Document Revised: 06/06/2015 Document Reviewed: 07/01/2012 Elsevier Interactive Patient Education  2017 [employer]

## 2016-08-05 NOTE — Progress Notes (Addendum)
[facility]  PATIENT: Joel Payne DOB: [birth year]   REASON FOR VISIT: follow up stroke  HISTORY FROM:patient    HISTORY OF PRESENT ILLNESS:UPDATE 07/25/2018CM [name], 71 year old male returns for follow-up with history of stroke in October 2017 secondary to new onset atrial fibrillation. He is currently on eliquis without further stroke or TIA symptoms. He is on Pravachol for hyperlipidemia without myalgias. Blood pressure in the office today 126/70. He is back to his normal activities. He exercises by walking. 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 He returns for reevaluation without further neurological symptoms.  HISTORY 12/16/15 PSMr. Joel Payne is a 71 year old Caucasian male seen today for first office follow-up visit following hospital admission for stroke in October 2017. Joel Headsrthur D Murphyis a 70 y.o.malereferred for consult of TIA.PMHx of CAD, HLD, HTN, nephrolithiasis,. Patient presented with a blurry right eye with localized headache in the left eye without weakness, numbness, paresthesias. Occurred 930 PM last evening (10/27/2015) with improvement. No Hx of migraines. He is on ASA 81mg  and endorses compliance. He lost vision in the right eye for about 40 minutes, just the right eye and just the right half. About 930 and then developed a left-sided headache. At the temple. No weakness, no sensory changes, no recent illnesses or new medications, not light headed or SOB or any other associated symptoms, no hx of stroke. Now he is completely fine. He is compliant with medications. Patient was last known well at 9:30 PM on 10/28/2015. Patient was not administered IV t-PA secondary to deficits resolved. Hewas admitted for further evaluation and treatment. 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 Transthoracic echo  showed normal ejection fraction but a Vicryl a cane assist and thrombus cannot be ruled out. Patient been for transesophageal echocardiogram that showed a small PFO but patient went into atrial fibrillation during the procedure. LDL cholesterol was elevated at 120 mg percent and hemoglobin A1c was 5.4. Patient was started on eliquis for anticoagulation given high Italychad 2 vasc score of 4. He states his done well since discharge. He has no headaches. His peripheral vision in fact has improved. [facility] and visual fields were found to be adequate. He was advised to drive and has been doing so for last few weeks without incident. He starting eliquis pelvic without bleeding or bruising. He is also on Crestor and denies any muscle aches and pains. He has been in fact no new complaints today and feels his back to his baseline.   REVIEW OF SYSTEMS: Full 14 system review of systems performed and notable only for those listed, all others are neg:  Constitutional: neg  Cardiovascular: neg Ear/Nose/Throat: neg  Skin: neg Eyes: neg Respiratory: neg Gastroitestinal: neg  Hematology/Lymphatic: neg  Endocrine: neg Musculoskeletal:neg Allergy/Immunology: neg Neurological: neg Psychiatric: neg Sleep : Restless leg   ALLERGIES: Allergies  Allergen Reactions  . Simvastatin Other (See Comments)    Leg cramps    HOME MEDICATIONS: Outpatient Medications Prior to Visit  Medication Sig Dispense Refill  . apixaban (ELIQUIS) 5 MG TABS tablet Take 1 tablet (5 mg total) by mouth 2 (two) times daily. 180 tablet 2  . metoprolol tartrate (LOPRESSOR) 25 MG tablet Take 1 tablet (25 mg total) by mouth 2 (two) times daily. 60 tablet 3  . pravastatin (PRAVACHOL)  20 MG tablet TAKE 1 TABLET (20 MG TOTAL) BY MOUTH DAILY. 90 tablet 2  . metoprolol tartrate (LOPRESSOR) 25 MG tablet TAKE 1 TABLET TWICE A DAY 60 tablet 3  . omega-3 acid ethyl esters (LOVAZA) 1 G capsule  Take 1 g by mouth daily.     No facility-administered medications prior to visit.     PAST MEDICAL HISTORY: Past Medical History:  Diagnosis Date  . ALLERGIC RHINITIS 11/08/2006  . BENIGN PROSTATIC HYPERTROPHY 10/12/2006  . CORONARY ARTERY DISEASE 10/12/2006   Catheterization was normal 2012, mild nonobstructive coronary disease, normal LV function  . HYPERLIPIDEMIA 11/08/2006  . HYPERTENSION 10/12/2006  . Kidney stone    in 1980  . 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 Shortness of breath    April, 2012  /  catheterization May, 2012 mild nonobstructive coronary disease  . Stroke (HCC)   . TIA (transient ischemic attack) 10/2015  . Urinary frequency 10/18/2009    PAST SURGICAL HISTORY: Past Surgical History:  Procedure Laterality Date  . CARDIAC CATHETERIZATION  2012  . non radical prostatectomy  08/2011  . PROSTATE BIOPSY  2008  . TEE WITHOUT CARDIOVERSION N/A 10/31/2015   Procedure: TRANSESOPHAGEAL ECHOCARDIOGRAM (TEE) will alos have loop;  Surgeon: Wendall StadePeter C Nishan, MD;  [facility];  Service: Cardiovascular;  Laterality: N/A;  . TONSILLECTOMY      FAMILY HISTORY: Family History  Problem Relation Age of Onset  . Colon cancer Neg Hx   . Esophageal cancer Neg Hx   . Rectal cancer Neg Hx   . Stomach cancer Neg Hx     SOCIAL HISTORY: Social History   Social History  . Marital status: Single    Spouse name: N/A  . Number of children: 3  . Years of education: PhD   Occupational History  . Anthropology    Social History Main Topics  . Smoking status: Never Smoker  . Smokeless tobacco: Never Used  . Alcohol use No  . Drug use: No  . Sexual activity: Not on file   Other Topics Concern  . Not on file   Social History Narrative   Lives with significant other   Caffeine use: Coffee- 2 cups decaf per day   No tea/soda     PHYSICAL EXAM  Vitals:   08/05/16 0715  BP: 126/74  Pulse: 62  Weight: 198 lb 3.2 oz (89.9 kg)  Height: 5'  8" (1.727 m)   Body mass index is 30.14 kg/m.  Generalized: Well developed, in no acute distress  Head: normocephalic and atraumatic,. Oropharynx benign  Neck: Supple, no carotid bruits  Cardiac: Regular rate rhythm, no murmur  Musculoskeletal: No deformity   Neurological examination   Mentation: Alert oriented to time, place, history taking. Attention span and concentration appropriate. Recent and remote memory intact.  Follows all commands speech and language fluent.   Cranial nerve II-XII: Pupils were equal round reactive to light extraocular movements were full, visual field were full on confrontational test. Facial sensation and strength were normal. hearing was intact to finger rubbing bilaterally. Uvula tongue midline. head turning and shoulder shrug were normal and symmetric.Tongue protrusion into cheek strength was normal. Motor: normal bulk and tone, full strength in the BUE, BLE, fine finger movements normal, no pronator drift. No focal weakness Sensory: normal and symmetric to light touch, in the upper and lower extremities  Coordination: finger-nose-finger, heel-to-shin bilaterally, no dysmetria Reflexes: 1+ upper lower and symmetric, plantar responses were flexor  bilaterally. Gait and Station: Rising up from seated position without assistance, normal stance,  moderate stride, good arm swing, smooth turning, able to perform tiptoe, and heel walking without difficulty. Tandem gait is steady. No assistive device  DIAGNOSTIC DATA (LABS, IMAGING, TESTING) - I reviewed patient records, labs, notes, testing and imaging myself where available.  Lab Results  Component Value Date   WBC 5.2 11/18/2015   HGB 15.7 11/18/2015   HCT 45.9 11/18/2015   MCV 86.8 11/18/2015   PLT 177.0 11/18/2015      Component Value Date/Time   NA 143 11/18/2015 0821   K 4.1 11/18/2015 0821   CL 105 11/18/2015 0821   CO2 31 11/18/2015 0821   GLUCOSE 86 11/18/2015 0821   BUN 13 11/18/2015 0821    CREATININE 0.80 11/18/2015 0821   CALCIUM 9.3 11/18/2015 0821   PROT 6.4 11/18/2015 0821   ALBUMIN 4.2 11/18/2015 0821   AST 16 11/18/2015 0821   ALT 17 11/18/2015 0821   ALKPHOS 46 11/18/2015 0821   BILITOT 0.8 11/18/2015 0821   GFRNONAA >60 10/27/2015 2238   GFRAA >60 10/27/2015 2238   Lab Results  Component Value Date   CHOL 177 11/18/2015   HDL 35.80 (L) 11/18/2015   LDLCALC 113 (H) 11/18/2015   LDLDIRECT 157.5 04/06/2012   TRIG 140.0 11/18/2015   CHOLHDL 5 11/18/2015   Lab Results  Component Value Date   HGBA1C 5.4 10/28/2015    Lab Results  Component Value Date   TSH 3.55 11/18/2015      ASSESSMENT AND PLAN 41 year Caucasian male with 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 Vascular risk factors of hyperlipidemia, obesity and atrial fibrillation and hypertension. The patient is a current patient of Dr. Pearlean Brownie  who is out of the office today . This note is sent to the work in doctor.      PLAN: Stressed the importance of management of risk factors to prevent further stroke Continue eliquis for secondary stroke prevention and atrial fibrillation Maintain strict control of hypertension with blood pressure goal below 130/90, today's reading 126/74 continue antihypertensive medications Cholesterol with LDL cholesterol less than 70, followed by primary care,  continue statin drugs Pravachol Exercise by walking,   eat healthy diet with whole grains,  fresh fruits and vegetables Will discharge from stroke clinic I spent 25 minutes in total face to face time with the patient more than 50% of which was spent counseling and coordination of care, reviewing test results reviewing medications and discussing and reviewing the diagnosis of stroke and management of risk factors and importance of following up with primary care for  risk factor management. Nilda Riggs, [facility], [facility], APRN  [facility] [address] [place], [place] [address] [phone]  I reviewed the above note and documentation by the Nurse Practitioner and agree with the history, physical exam, assessment and plan as outlined above. I was immediately available for face-to-face consultation. Huston Foley, MD, PhD [facility])

## 2016-09-12 ENCOUNTER — Other Ambulatory Visit (HOSPITAL_COMMUNITY): Payer: Self-pay | Admitting: Nurse Practitioner

## 2016-09-12 ENCOUNTER — Other Ambulatory Visit: Payer: Self-pay | Admitting: Internal Medicine

## 2016-10-01 ENCOUNTER — Encounter: Payer: Self-pay | Admitting: Internal Medicine

## 2017-04-05 ENCOUNTER — Ambulatory Visit: Payer: BC Managed Care – PPO | Admitting: Internal Medicine

## 2017-04-05 ENCOUNTER — Encounter: Payer: Self-pay | Admitting: Internal Medicine

## 2017-04-05 VITALS — BP 182/90 | HR 61 | Temp 98.1°F | Wt 205.0 lb

## 2017-04-05 DIAGNOSIS — I10 Essential (primary) hypertension: Secondary | ICD-10-CM

## 2017-04-05 DIAGNOSIS — I480 Paroxysmal atrial fibrillation: Secondary | ICD-10-CM | POA: Diagnosis not present

## 2017-04-05 MED ORDER — LISINOPRIL 10 MG PO TABS: 10.0000 mg | ORAL_TABLET | Freq: Every day | ORAL | 3 refills | Status: DC

## 2017-04-05 NOTE — Patient Instructions (Signed)
Dermatology consultation as scheduled  Return for your annual exam as scheduled  Please check your blood pressure on a regular basis.  If it is consistently greater than 140/90, please make an office appointment.  Please bring your home blood pressure monitoring device to your next exam

## 2017-04-05 NOTE — Progress Notes (Signed)
Subjective:    Patient ID: Joel Payne, male    DOB: [birth year], 72 y.o.   MRN: [id number]  HPI 72 year old patient has a history of essential hypertension. He is scheduled for an annual exam in 7 days He is here today concerned about a lesion involving his right exterior ear first noted by his barber.  He is scheduled for a dermatology evaluation soon.  BP Readings from Last 3 Encounters:  04/05/17 (!) 182/90  08/05/16 126/74  12/16/15 (!) 150/80   Past Medical History:  Diagnosis Date  . ALLERGIC RHINITIS 11/08/2006  . BENIGN PROSTATIC HYPERTROPHY 10/12/2006  . CORONARY ARTERY DISEASE 10/12/2006   Catheterization was normal 2012, mild nonobstructive coronary disease, normal LV function  . HYPERLIPIDEMIA 11/08/2006  . HYPERTENSION 10/12/2006  . Kidney stone    in 1980  . 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 Shortness of breath    April, 2012  /  catheterization May, 2012 mild nonobstructive coronary disease  . Stroke (HCC)   . TIA (transient ischemic attack) 10/2015  . Urinary frequency 10/18/2009     Social History   Socioeconomic History  . Marital status: Single    Spouse name: Not on file  . Number of children: 3  . Years of education: PhD  . Highest education level: Not on file  Occupational History  . Occupation: Anthropology  Social Needs  . Financial resource strain: Not on file  . Food insecurity:    Worry: Not on file    Inability: Not on file  . Transportation needs:    Medical: Not on file    Non-medical: Not on file  Tobacco Use  . Smoking status: Never Smoker  . Smokeless tobacco: Never Used  Substance and Sexual Activity  . Alcohol use: No    Alcohol/week: 0.5 oz    Types: 1 Standard drinks or equivalent per week  . Drug use: No  . Sexual activity: Not on file  Lifestyle  . Physical activity:    Days per week: Not on file    Minutes per session: Not on file  . Stress: Not on file  Relationships  . Social connections:      Talks on phone: Not on file    Gets together: Not on file    Attends religious service: Not on file    Active member of club or organization: Not on file    Attends meetings of clubs or organizations: Not on file    Relationship status: Not on file  . Intimate partner violence:    Fear of current or ex partner: Not on file    Emotionally abused: Not on file    Physically abused: Not on file    Forced sexual activity: Not on file  Other Topics Concern  . Not on file  Social History Narrative   Lives with significant other   Caffeine use: Coffee- 2 cups decaf per day   No tea/soda    Past Surgical History:  Procedure Laterality Date  . CARDIAC CATHETERIZATION  2012  . non radical prostatectomy  08/2011  . PROSTATE BIOPSY  2008  . TEE WITHOUT CARDIOVERSION N/A 10/31/2015   Procedure: TRANSESOPHAGEAL ECHOCARDIOGRAM (TEE) will alos have loop;  Surgeon: Wendall Stade, MD;  Location: [facility] ENDOSCOPY;  Service: Cardiovascular;  Laterality: N/A;  . TONSILLECTOMY      Family History  Problem Relation Age of Onset  . Colon cancer Neg Hx   . Esophageal  cancer Neg Hx   . Rectal cancer Neg Hx   . Stomach cancer Neg Hx     Allergies  Allergen Reactions  . Simvastatin Other (See Comments)    Leg cramps    Current Outpatient Medications on File Prior to Visit  Medication Sig Dispense Refill  . ELIQUIS 5 MG TABS tablet TAKE 1 TABLET TWICE A DAY 180 tablet 2  . metoprolol tartrate (LOPRESSOR) 25 MG tablet Take 1 tablet (25 mg total) by mouth 2 (two) times daily. 60 tablet 3  . pravastatin (PRAVACHOL) 20 MG tablet TAKE 1 TABLET (20 MG TOTAL) BY MOUTH DAILY. 90 tablet 2   No current facility-administered medications on file prior to visit.     BP (!) 182/90 (BP Location: Right Arm, Patient Position: Sitting, Cuff Size: Large)   Pulse 61   Temp 98.1 F (36.7 C) (Oral)   Wt 205 lb (93 kg)   SpO2 96%   BMI 31.17 kg/m      Review of Systems  Constitutional: Negative for  appetite change, chills, fatigue and fever.  HENT: Negative for congestion, dental problem, ear pain, hearing loss, sore throat, tinnitus, trouble swallowing and voice change.   Eyes: Negative for pain, discharge and visual disturbance.  Respiratory: Negative for cough, chest tightness, wheezing and stridor.   Cardiovascular: Negative for chest pain, palpitations and leg swelling.  Gastrointestinal: Negative for abdominal distention, abdominal pain, blood in stool, constipation, diarrhea, nausea and vomiting.  Genitourinary: Negative for difficulty urinating, discharge, flank pain, genital sores, hematuria and urgency.  Musculoskeletal: Negative for arthralgias, back pain, gait problem, joint swelling, myalgias and neck stiffness.  Skin: Positive for wound. Negative for rash.  Neurological: Negative for dizziness, syncope, speech difficulty, weakness, numbness and headaches.  Hematological: Negative for adenopathy. Does not bruise/bleed easily.  Psychiatric/Behavioral: Negative for behavioral problems and dysphoric mood. The patient is not nervous/anxious.        Objective:   Physical Exam  Constitutional:  Blood pressure 160/90  Skin:  Approximate 8 cm dry scaly lesion involving the right external ear          Assessment & Plan:   Essential hypertension.  We will add lisinopril 10 mg daily.  Patient has is in the exam scheduled 7 days.  Will reassess blood pressure at that time  Nonspecific lesion right external ear.  Rule out BCC.  Patient does have a dermatology appointment scheduled  Rogelia BogaKWIATKOWSKI,PETER FRANK

## 2017-04-12 ENCOUNTER — Encounter: Payer: Self-pay | Admitting: Internal Medicine

## 2017-04-12 ENCOUNTER — Ambulatory Visit (INDEPENDENT_AMBULATORY_CARE_PROVIDER_SITE_OTHER): Payer: BC Managed Care – PPO | Admitting: Internal Medicine

## 2017-04-12 VITALS — BP 140/70 | HR 59 | Temp 97.7°F | Ht 67.0 in | Wt 200.8 lb

## 2017-04-12 DIAGNOSIS — I10 Essential (primary) hypertension: Secondary | ICD-10-CM | POA: Diagnosis not present

## 2017-04-12 DIAGNOSIS — E785 Hyperlipidemia, unspecified: Secondary | ICD-10-CM

## 2017-04-12 DIAGNOSIS — Z0000 Encounter for general adult medical examination without abnormal findings: Secondary | ICD-10-CM | POA: Diagnosis not present

## 2017-04-12 DIAGNOSIS — D649 Anemia, unspecified: Secondary | ICD-10-CM

## 2017-04-12 DIAGNOSIS — R972 Elevated prostate specific antigen [PSA]: Secondary | ICD-10-CM

## 2017-04-12 DIAGNOSIS — I480 Paroxysmal atrial fibrillation: Secondary | ICD-10-CM

## 2017-04-12 LAB — CBC WITH DIFFERENTIAL/PLATELET
BASOS PCT: 0.9 % (ref 0.0–3.0)
Basophils Absolute: 0 10*3/uL (ref 0.0–0.1)
EOS ABS: 0.2 10*3/uL (ref 0.0–0.7)
EOS PCT: 3.7 % (ref 0.0–5.0)
HEMATOCRIT: 45.3 % (ref 39.0–52.0)
HEMOGLOBIN: 15.4 g/dL (ref 13.0–17.0)
LYMPHS PCT: 26.3 % (ref 12.0–46.0)
Lymphs Abs: 1.4 10*3/uL (ref 0.7–4.0)
MCHC: 34 g/dL (ref 30.0–36.0)
MCV: 86.3 fl (ref 78.0–100.0)
Monocytes Absolute: 0.6 10*3/uL (ref 0.1–1.0)
Monocytes Relative: 10.9 % (ref 3.0–12.0)
Neutro Abs: 3.1 10*3/uL (ref 1.4–7.7)
Neutrophils Relative %: 58.2 % (ref 43.0–77.0)
Platelets: 188 10*3/uL (ref 150.0–400.0)
RBC: 5.24 Mil/uL (ref 4.22–5.81)
RDW: 13.4 % (ref 11.5–15.5)
WBC: 5.2 10*3/uL (ref 4.0–10.5)

## 2017-04-12 LAB — LIPID PANEL
CHOLESTEROL: 142 mg/dL (ref 0–200)
HDL: 31.8 mg/dL — ABNORMAL LOW (ref >39.00)
LDL CALC: 84 mg/dL (ref 0–99)
NonHDL: 109.86
TRIGLYCERIDES: 129 mg/dL (ref 0.0–149.0)
Total CHOL/HDL Ratio: 4
VLDL: 25.8 mg/dL (ref 0.0–40.0)

## 2017-04-12 LAB — COMPREHENSIVE METABOLIC PANEL
ALBUMIN: 4 g/dL (ref 3.5–5.2)
ALT: 19 U/L (ref 0–53)
AST: 21 U/L (ref 0–37)
Alkaline Phosphatase: 43 U/L (ref 39–117)
BUN: 13 mg/dL (ref 6–23)
CALCIUM: 8.9 mg/dL (ref 8.4–10.5)
CHLORIDE: 104 meq/L (ref 96–112)
CO2: 29 mEq/L (ref 19–32)
CREATININE: 0.72 mg/dL (ref 0.40–1.50)
GFR: 114.04 mL/min (ref >60.00)
Glucose, Bld: 95 mg/dL (ref 70–99)
POTASSIUM: 4 meq/L (ref 3.5–5.1)
Sodium: 139 mEq/L (ref 135–145)
Total Bilirubin: 0.8 mg/dL (ref 0.2–1.2)
Total Protein: 6.7 g/dL (ref 6.0–8.3)

## 2017-04-12 LAB — TSH: TSH: 1.96 u[IU]/mL (ref 0.35–4.50)

## 2017-04-12 NOTE — Patient Instructions (Signed)
Dermatology follow-up as scheduled  Limit your sodium (Salt) intake  Please check your blood pressure on a regular basis.  If it is consistently greater than 150/90, please make an office appointment.    It is important that you exercise regularly, at least 20 minutes 3 to 4 times per week.  If you develop chest pain or shortness of breath seek  medical attention.  Return in 6 months for follow-up

## 2017-04-12 NOTE — Progress Notes (Signed)
Subjective:    Patient ID: Joel Payne, male    DOB: [birth year], 72 y.o.   MRN: [id number]  HPI 72 year old patient who is seen today for a annual exam as well as a subsequent Medicare wellness visit. He was seen 1 week ago with elevated blood pressure and lisinopril was added to his regimen. He has a history of paroxysmal atrial fibrillation complicated by a stroke resulting in temporary right hemianopsia.  Remains on anticoagulation. He is scheduled to see dermatology next month for a lesion involving his right external ear.  Remains on statin therapy for dyslipidemia. He is up-to-date on screening colonoscopies  He has a history of BPH and is status post TURP.  Past Medical History:  Diagnosis Date  . ALLERGIC RHINITIS 11/08/2006  . BENIGN PROSTATIC HYPERTROPHY 10/12/2006  . CORONARY ARTERY DISEASE 10/12/2006   Catheterization was normal 2012, mild nonobstructive coronary disease, normal LV function  . HYPERLIPIDEMIA 11/08/2006  . HYPERTENSION 10/12/2006  . Kidney stone    in 1980  . 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 Shortness of breath    April, 2012  /  catheterization May, 2012 mild nonobstructive coronary disease  . Stroke (HCC)   . TIA (transient ischemic attack) 10/2015  . Urinary frequency 10/18/2009     Social History   Socioeconomic History  . Marital status: Single    Spouse name: Not on file  . Number of children: 3  . Years of education: PhD  . Highest education level: Not on file  Occupational History  . Occupation: Anthropology  Social Needs  . Financial resource strain: Not on file  . Food insecurity:    Worry: Not on file    Inability: Not on file  . Transportation needs:    Medical: Not on file    Non-medical: Not on file  Tobacco Use  . Smoking status: Never Smoker  . Smokeless tobacco: Never Used  Substance and Sexual Activity  . Alcohol use: No    Alcohol/week: 0.5 oz    Types: 1 Standard drinks or equivalent per week   . Drug use: No  . Sexual activity: Not on file  Lifestyle  . Physical activity:    Days per week: Not on file    Minutes per session: Not on file  . Stress: Not on file  Relationships  . Social connections:    Talks on phone: Not on file    Gets together: Not on file    Attends religious service: Not on file    Active member of club or organization: Not on file    Attends meetings of clubs or organizations: Not on file    Relationship status: Not on file  . Intimate partner violence:    Fear of current or ex partner: Not on file    Emotionally abused: Not on file    Physically abused: Not on file    Forced sexual activity: Not on file  Other Topics Concern  . Not on file  Social History Narrative   Lives with significant other   Caffeine use: Coffee- 2 cups decaf per day   No tea/soda    Past Surgical History:  Procedure Laterality Date  . CARDIAC CATHETERIZATION  2012  . non radical prostatectomy  08/2011  . PROSTATE BIOPSY  2008  . TEE WITHOUT CARDIOVERSION N/A 10/31/2015   Procedure: TRANSESOPHAGEAL ECHOCARDIOGRAM (TEE) will alos have loop;  Surgeon: Wendall StadePeter C Nishan, MD;  [facility];  Service: Cardiovascular;  Laterality: N/A;  . TONSILLECTOMY      Family History  Problem Relation Age of Onset  . Colon cancer Neg Hx   . Esophageal cancer Neg Hx   . Rectal cancer Neg Hx   . Stomach cancer Neg Hx     Allergies  Allergen Reactions  . Simvastatin Other (See Comments)    Leg cramps    Current Outpatient Medications on File Prior to Visit  Medication Sig Dispense Refill  . ELIQUIS 5 MG TABS tablet TAKE 1 TABLET TWICE A DAY 180 tablet 2  . lisinopril (PRINIVIL,ZESTRIL) 10 MG tablet Take 1 tablet (10 mg total) by mouth daily. 90 tablet 3  . metoprolol tartrate (LOPRESSOR) 25 MG tablet Take 1 tablet (25 mg total) by mouth 2 (two) times daily. 60 tablet 3  . pravastatin (PRAVACHOL) 20 MG tablet TAKE 1 TABLET (20 MG TOTAL) BY MOUTH DAILY. 90 tablet 2    No current facility-administered medications on file prior to visit.     BP 140/70 (BP Location: Right Arm, Patient Position: Sitting, Cuff Size: Large)   Pulse (!) 59   Temp 97.7 F (36.5 C) (Oral)   Ht 5\' 7"  (1.702 m)   Wt 200 lb 12.8 oz (91.1 kg)   SpO2 98%   BMI 31.45 kg/m    Subsequent Medicare wellness visit  1. Risk factors, based on past  M,S,F history.  Cardiovascular risk factors include hypertension and dyslipidemia.  He is status post heart catheterization in 2012 that revealed very mild nonobstructive coronary artery disease and normal LV function  2.  Physical activities:  No activity restrictions  3.  Depression/mood: No history of major depression or mood disorder  4.  Hearing: No deficits  5.  ADL's:  independent  6.  Fall risk:low 7.  Home safety: No problems identified  8.  Height weight, and visual acuity; height and weight stable no change in visual acuity 9.  Counseling: Continue heart healthy diet and regular exercise  10. Lab orders based on risk factors: will check laboratory update  11. Referral : Follow-up dermatology as scheduled   12. Care plan:continue efforts at aggressive risk factor modification  13. Cognitive assessment: Alert and oriented with normal affect.  No cognitive dysfunction   14. Screening: Patient provided with a written and personalized 5-10 year screening schedule in the AVS.    15. Provider List Update:  primary care GI dermatology.  Patient has been discharged from neurology     Review of Systems  Constitutional: Negative for appetite change, chills, fatigue and fever.  HENT: Negative for congestion, dental problem, ear pain, hearing loss, sore throat, tinnitus, trouble swallowing and voice change.   Eyes: Negative for pain, discharge and visual disturbance.  Respiratory: Negative for cough, chest tightness, wheezing and stridor.   Cardiovascular: Negative for chest pain, palpitations and leg swelling.   Gastrointestinal: Negative for abdominal distention, abdominal pain, blood in stool, constipation, diarrhea, nausea and vomiting.  Genitourinary: Negative for difficulty urinating, discharge, flank pain, genital sores, hematuria and urgency.  Musculoskeletal: Negative for arthralgias, back pain, gait problem, joint swelling, myalgias and neck stiffness.  Skin: Positive for rash.       Lesion right external ear  Neurological: Negative for dizziness, syncope, speech difficulty, weakness, numbness and headaches.  Hematological: Negative for adenopathy. Does not bruise/bleed easily.  Psychiatric/Behavioral: Negative for behavioral problems and dysphoric mood. The patient is not nervous/anxious.        Objective:   Physical  Exam  Constitutional: He appears well-developed and well-nourished.  Blood pressure 140/70-80  HENT:  Head: Normocephalic and atraumatic.  Right Ear: External ear normal.  Left Ear: External ear normal.  Nose: Nose normal.  Mouth/Throat: Oropharynx is clear and moist.  Eyes: Pupils are equal, round, and reactive to light. Conjunctivae and EOM are normal. No scleral icterus.  Neck: Normal range of motion. Neck supple. No JVD present. No thyromegaly present.  Cardiovascular: Regular rhythm, normal heart sounds and intact distal pulses. Exam reveals no gallop and no friction rub.  No murmur heard. A few ectopics.  Basically regular rhythm  Pulmonary/Chest: Effort normal and breath sounds normal. He exhibits no tenderness.  Abdominal: Soft. Bowel sounds are normal. He exhibits no distension and no mass. There is no tenderness.  Genitourinary: Prostate normal and penis normal.  Musculoskeletal: Normal range of motion. He exhibits no edema or tenderness.  Lymphadenopathy:    He has no cervical adenopathy.  Neurological: He is alert. He has normal reflexes. No cranial nerve deficit. Coordination normal.  Skin: Skin is warm and dry. No rash noted.  8 mm lesion right  external ear slightly ulcerated  Psychiatric: He has a normal mood and affect. His behavior is normal.          Assessment & Plan:     Preventive health care Subsequent Medicare wellness visit Essential hypertension improved Lesion right external ear.  Rule out basal cell cancer History of paroxysmal atrial fibrillation and cardioembolic stroke.  Continue chronic anticoagulation Mild dyslipidemia continue statin therapy  Check updated lab Dermatology follow-up as scheduled Return here in 6 months or as needed  NIKE

## 2017-04-13 LAB — HEPATITIS C ANTIBODY
HEP C AB: NONREACTIVE
SIGNAL TO CUT-OFF: 0.03 (ref <1.00)

## 2017-05-24 ENCOUNTER — Other Ambulatory Visit: Payer: Self-pay | Admitting: Internal Medicine

## 2017-06-10 ENCOUNTER — Other Ambulatory Visit (HOSPITAL_COMMUNITY): Payer: Self-pay | Admitting: Nurse Practitioner

## 2017-09-30 ENCOUNTER — Ambulatory Visit: Payer: BC Managed Care – PPO | Admitting: Internal Medicine

## 2017-09-30 ENCOUNTER — Encounter: Payer: Self-pay | Admitting: Internal Medicine

## 2017-09-30 VITALS — BP 160/88 | HR 57 | Temp 98.1°F | Wt 203.6 lb

## 2017-09-30 DIAGNOSIS — I480 Paroxysmal atrial fibrillation: Secondary | ICD-10-CM

## 2017-09-30 DIAGNOSIS — I10 Essential (primary) hypertension: Secondary | ICD-10-CM | POA: Diagnosis not present

## 2017-09-30 DIAGNOSIS — E785 Hyperlipidemia, unspecified: Secondary | ICD-10-CM

## 2017-09-30 DIAGNOSIS — I2510 Atherosclerotic heart disease of native coronary artery without angina pectoris: Secondary | ICD-10-CM

## 2017-09-30 MED ORDER — LOSARTAN POTASSIUM-HCTZ 100-12.5 MG PO TABS: 1.0000 | ORAL_TABLET | Freq: Every day | ORAL | 3 refills | Status: DC

## 2017-09-30 NOTE — Patient Instructions (Addendum)
Limit your sodium (Salt) intake  Discontinue lisinopril  Return in 6 weeks for follow-up

## 2017-09-30 NOTE — Progress Notes (Signed)
Subjective:    Patient ID: Joel Payne, male    DOB: 1945/10/18, 72 y.o.   MRN: [id number]  HPI  72 year old patient who is seen today for his six-month follow-up.  He has a history of essential hypertension as well as coronary artery disease and cerebrovascular disease. In general doing well.  Denies any focal neurological symptoms. He does state that systolic blood pressures at home are often elevated.  He also describes a low-grade cough and throat irritation with the sense that he must constantly clear his throat.  The symptoms started after initiating lisinopril last visit  Past Medical History:  Diagnosis Date  . ALLERGIC RHINITIS 11/08/2006  . BENIGN PROSTATIC HYPERTROPHY 10/12/2006  . CORONARY ARTERY DISEASE 10/12/2006   Catheterization was normal 2012, mild nonobstructive coronary disease, normal LV function  . HYPERLIPIDEMIA 11/08/2006  . HYPERTENSION 10/12/2006  . Kidney stone    in 1980  . 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 Shortness of breath    April, 2012  /  catheterization May, 2012 mild nonobstructive coronary disease  . Stroke (HCC)   . TIA (transient ischemic attack) 10/2015  . Urinary frequency 10/18/2009     Social History   Socioeconomic History  . Marital status: Single    Spouse name: Not on file  . Number of children: 3  . Years of education: PhD  . Highest education level: Not on file  Occupational History  . Occupation: Anthropology  Social Needs  . Financial resource strain: Not on file  . Food insecurity:    Worry: Not on file    Inability: Not on file  . Transportation needs:    Medical: Not on file    Non-medical: Not on file  Tobacco Use  . Smoking status: Never Smoker  . Smokeless tobacco: Never Used  Substance and Sexual Activity  . Alcohol use: No    Alcohol/week: 1.0 standard drinks    Types: 1 Standard drinks or equivalent per week  . Drug use: No  . Sexual activity: Not on file  Lifestyle  . Physical  activity:    Days per week: Not on file    Minutes per session: Not on file  . Stress: Not on file  Relationships  . Social connections:    Talks on phone: Not on file    Gets together: Not on file    Attends religious service: Not on file    Active member of club or organization: Not on file    Attends meetings of clubs or organizations: Not on file    Relationship status: Not on file  . Intimate partner violence:    Fear of current or ex partner: Not on file    Emotionally abused: Not on file    Physically abused: Not on file    Forced sexual activity: Not on file  Other Topics Concern  . Not on file  Social History Narrative   Lives with significant other   Caffeine use: Coffee- 2 cups decaf per day   No tea/soda    Past Surgical History:  Procedure Laterality Date  . CARDIAC CATHETERIZATION  2012  . non radical prostatectomy  08/2011  . PROSTATE BIOPSY  2008  . TEE WITHOUT CARDIOVERSION N/A 10/31/2015   Procedure: TRANSESOPHAGEAL ECHOCARDIOGRAM (TEE) will alos have loop;  Surgeon: Wendall Stade, MD;  Location: [facility] ENDOSCOPY;  Service: Cardiovascular;  Laterality: N/A;  . TONSILLECTOMY      Family History  Problem Relation Age of Onset  . Colon cancer Neg Hx   . Esophageal cancer Neg Hx   . Rectal cancer Neg Hx   . Stomach cancer Neg Hx     Allergies  Allergen Reactions  . Simvastatin Other (See Comments)    Leg cramps    Current Outpatient Medications on File Prior to Visit  Medication Sig Dispense Refill  . ELIQUIS 5 MG TABS tablet TAKE 1 TABLET BY MOUTH TWICE A DAY 180 tablet 2  . metoprolol tartrate (LOPRESSOR) 25 MG tablet Take 1 tablet (25 mg total) by mouth 2 (two) times daily. 60 tablet 3  . pravastatin (PRAVACHOL) 20 MG tablet TAKE 1 TABLET (20 MG TOTAL) BY MOUTH DAILY. 90 tablet 1   No current facility-administered medications on file prior to visit.     BP (!) 160/88 (BP Location: Right Arm, Patient Position: Sitting, Cuff Size: Large)   Pulse  (!) 57   Temp 98.1 F (36.7 C) (Oral)   Wt 203 lb 9.6 oz (92.4 kg)   SpO2 97%   BMI 31.89 kg/m     Review of Systems  Constitutional: Negative for appetite change, chills, fatigue and fever.  HENT: Negative for congestion, dental problem, ear pain, hearing loss, sore throat, tinnitus, trouble swallowing and voice change.   Eyes: Negative for pain, discharge and visual disturbance.  Respiratory: Positive for cough. Negative for chest tightness, wheezing and stridor.   Cardiovascular: Negative for chest pain, palpitations and leg swelling.  Gastrointestinal: Negative for abdominal distention, abdominal pain, blood in stool, constipation, diarrhea, nausea and vomiting.  Genitourinary: Negative for difficulty urinating, discharge, flank pain, genital sores, hematuria and urgency.  Musculoskeletal: Negative for arthralgias, back pain, gait problem, joint swelling, myalgias and neck stiffness.  Skin: Negative for rash.  Neurological: Negative for dizziness, syncope, speech difficulty, weakness, numbness and headaches.  Hematological: Negative for adenopathy. Does not bruise/bleed easily.  Psychiatric/Behavioral: Negative for behavioral problems and dysphoric mood. The patient is not nervous/anxious.        Objective:   Physical Exam  Constitutional: He is oriented to person, place, and time. He appears well-developed.  Blood pressure 160/80  HENT:  Head: Normocephalic.  Right Ear: External ear normal.  Left Ear: External ear normal.  Eyes: Conjunctivae and EOM are normal.  Neck: Normal range of motion.  Cardiovascular: Normal rate and normal heart sounds.  Pulmonary/Chest: Breath sounds normal.  Abdominal: Bowel sounds are normal.  Musculoskeletal: Normal range of motion. He exhibits no edema or tenderness.  Neurological: He is alert and oriented to person, place, and time.  Psychiatric: He has a normal mood and affect. His behavior is normal.          Assessment & Plan:    Hypertension.  Suboptimal control Probable ACE induced cough Coronary artery disease History of cerebrovascular disease Dyslipidemia continue statin therapy  Will discontinue lisinopril and substitute losartan 100 with HCTZ 12.5 Return in 6 weeks for follow-up of blood pressure.  Consider bmet at that time  Gordy Saverseter F Trong Gosling

## 2017-11-14 ENCOUNTER — Other Ambulatory Visit: Payer: Self-pay | Admitting: Internal Medicine

## 2017-11-23 NOTE — Progress Notes (Signed)
Established Patient Office Visit     CC/Reason for Visit: BP followup  HPI: Joel Payne is a 72 y.o. male who is coming in today for BP follow up after lisinopril was discontinued at last visit due to a cough. He was started on Losartan/HCTZ 100/12.5 mg. He is also on metoprolol 25 mg BID. Past Medical History is significant for: HTN, hyperlipidemia and paroxysmal atrial fibrillation who is chronically anticoagulated on Eliquis.  He has noticed a mild itch over his forearms and calves ever since he started the losartan.  No rash.  He thinks it may be more dry skin due to the cold.   Health Maintenance:  -Colon Cancer: 3/14 (normal). Due 3/24 -Lung Ca (ages 39-80 with 30 pack-yr history or quit past 15 yrs): No -DEXA: only if high risk (low BMI, ETOH, smoker, steroid use, previous fractures, h/o falls: No -Vaccines: Tdap (due 7/20), PNA (completed), RZV (complete), had flu shot in 8/19. -HIV: Need to document -HIV PrEP if high risk: no -BP: HTN on treatment -Depression: PHQ 9 yearly -DM: due for FBS -Hep C (adults born between 1945-1965): negative 4/19. -AAA: does not need -Tobacco cessation: not a smoker -Healthy Diet and Exercise: Discussed DASH diet and at least 30 minutes of aerobic exercise daily.    Past Medical/Surgical History: Past Medical History:  Diagnosis Date  . ALLERGIC RHINITIS 11/08/2006  . BENIGN PROSTATIC HYPERTROPHY 10/12/2006  . CORONARY ARTERY DISEASE 10/12/2006   Catheterization was normal 2012, mild nonobstructive coronary disease, normal LV function  . HYPERLIPIDEMIA 11/08/2006  . HYPERTENSION 10/12/2006  . Kidney stone    in 1980  . 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 Shortness of breath    April, 2012  /  catheterization May, 2012 mild nonobstructive coronary disease  . Stroke (HCC)   . TIA (transient ischemic attack) 10/2015  . Urinary frequency 10/18/2009    Past Surgical History:  Procedure Laterality Date  .  CARDIAC CATHETERIZATION  2012  . non radical prostatectomy  08/2011  . PROSTATE BIOPSY  2008  . TEE WITHOUT CARDIOVERSION N/A 10/31/2015   Procedure: TRANSESOPHAGEAL ECHOCARDIOGRAM (TEE) will alos have loop;  Surgeon: Wendall Stade, MD;  Location: [facility] ENDOSCOPY;  Service: Cardiovascular;  Laterality: N/A;  . TONSILLECTOMY      Social History:  reports that he has never smoked. He has never used smokeless tobacco. He reports that he does not drink alcohol or use drugs.  Allergies: Allergies  Allergen Reactions  . Simvastatin Other (See Comments)    Leg cramps    Family History:  Family History  Problem Relation Age of Onset  . Colon cancer Neg Hx   . Esophageal cancer Neg Hx   . Rectal cancer Neg Hx   . Stomach cancer Neg Hx      Current Outpatient Medications:  .  ELIQUIS 5 MG TABS tablet, TAKE 1 TABLET BY MOUTH TWICE A DAY, Disp: 180 tablet, Rfl: 2 .  losartan-hydrochlorothiazide (HYZAAR) 100-12.5 MG tablet, Take 1 tablet by mouth daily., Disp: 90 tablet, Rfl: 3 .  metoprolol tartrate (LOPRESSOR) 25 MG tablet, Take 1 tablet (25 mg total) by mouth 2 (two) times daily., Disp: 60 tablet, Rfl: 3 .  pravastatin (PRAVACHOL) 20 MG tablet, TAKE 1 TABLET (20 MG TOTAL) BY MOUTH DAILY., Disp: 90 tablet, Rfl: 1   Review of Systems:  Respiratory: Denies SOB, DOE, cough, chest tightness,  and wheezing.   Cardiovascular: Denies chest pain, palpitations  and leg swelling.  Gastrointestinal: Denies nausea, vomiting, abdominal pain, diarrhea, constipation, blood in stool and abdominal distention.  Genitourinary: Denies dysuria, urgency, frequency, hematuria, flank pain and difficulty urinating.  Endocrine: Denies: hot or cold intolerance, sweats, changes in hair or nails, polyuria, polydipsia. Musculoskeletal: Denies myalgias, back pain, joint swelling, arthralgias and gait problem.  Neurological: Denies dizziness, seizures, syncope, weakness, light-headedness, numbness and headaches.    Hematological: Denies adenopathy. Easy bruising, personal or family bleeding history  Psychiatric/Behavioral: Denies suicidal ideation, mood changes, confusion, nervousness, sleep disturbance and agitation    Physical Exam: Vitals:   11/26/17 1431  BP: 130/80  Pulse: 72  Temp: 97.9 F (36.6 C)  TempSrc: Oral  SpO2: 95%  Weight: 208 lb (94.3 kg)    Body mass index is 32.58 kg/m.   Constitutional: NAD, calm, comfortable Neck: normal, supple, no masses, no thyromegaly Respiratory: clear to auscultation bilaterally, no wheezing, no crackles. Normal respiratory effort. No accessory muscle use.  Cardiovascular: Regular rate and rhythm, no murmurs / rubs / gallops. No extremity edema. 2+ pedal pulses. No carotid bruits.  Abdomen: no tenderness, no masses palpated. No hepatosplenomegaly. Bowel sounds positive.  Musculoskeletal: no clubbing / cyanosis. No joint deformity upper and lower extremities. Good ROM, no contractures. Normal muscle tone.  Neurologic: CN 2-12 grossly intact. Sensation intact, DTR normal. Strength 5/5 in all 4.  Psychiatric: Normal judgment and insight. Alert and oriented x 3. Normal mood.    Impression:  Essential hypertension -Blood pressure remains well controlled with change in medication to losartan/hydrochlorothiazide. -Blood pressure today's visit is 130/80. -Plan to return in 3 to 4 months for continued monitoring. -At that time will need repeat labs.  Dyslipidemia  Paroxysmal atrial fibrillation (HCC) -Rate controlled, on Eliquis.  Chronic anticoagulation      Patient Instructions  -I am happy to see that your blood pressure continues to be well controlled. -We will plan to see you back in 3 to 4 months. -No medication changes today. -Labs at next visit, please come fasting.   DASH Eating Plan DASH stands for "Dietary Approaches to Stop Hypertension." The DASH eating plan is a healthy eating plan that has been shown to reduce high  blood pressure (hypertension). It may also reduce your risk for type 2 diabetes, heart disease, and stroke. The DASH eating plan may also help with weight loss. What are tips for following this plan? General guidelines  Avoid eating more than 2,300 mg (milligrams) of salt (sodium) a day. If you have hypertension, you may need to reduce your sodium intake to 1,500 mg a day.  Limit alcohol intake to no more than 1 drink a day for nonpregnant women and 2 drinks a day for men. One drink equals 12 oz of beer, 5 oz of wine, or 1 oz of hard liquor.  Work with your health care provider to maintain a healthy body weight or to lose weight. Ask what an ideal weight is for you.  Get at least 30 minutes of exercise that causes your heart to beat faster (aerobic exercise) most days of the week. Activities may include walking, swimming, or biking.  Work with your health care provider or diet and nutrition specialist (dietitian) to adjust your eating plan to your individual calorie needs. Reading food labels  Check food labels for the amount of sodium per serving. Choose foods with less than 5 percent of the Daily Value of sodium. Generally, foods with less than 300 mg of sodium per serving fit into this  eating plan.  To find whole grains, look for the word "whole" as the first word in the ingredient list. Shopping  Buy products labeled as "low-sodium" or "no salt added."  Buy fresh foods. Avoid canned foods and premade or frozen meals. Cooking  Avoid adding salt when cooking. Use salt-free seasonings or herbs instead of table salt or sea salt. Check with your health care provider or pharmacist before using salt substitutes.  Do not fry foods. Cook foods using healthy methods such as baking, boiling, grilling, and broiling instead.  Cook with heart-healthy oils, such as olive, canola, soybean, or sunflower oil. Meal planning   Eat a balanced diet that includes: ? 5 or more servings of fruits and  vegetables each day. At each meal, try to fill half of your plate with fruits and vegetables. ? Up to 6-8 servings of whole grains each day. ? Less than 6 oz of lean meat, poultry, or fish each day. A 3-oz serving of meat is about the same size as a deck of cards. One egg equals 1 oz. ? 2 servings of low-fat dairy each day. ? A serving of nuts, seeds, or beans 5 times each week. ? Heart-healthy fats. Healthy fats called Omega-3 fatty acids are found in foods such as flaxseeds and coldwater fish, like sardines, salmon, and mackerel.  Limit how much you eat of the following: ? Canned or prepackaged foods. ? Food that is high in trans fat, such as fried foods. ? Food that is high in saturated fat, such as fatty meat. ? Sweets, desserts, sugary drinks, and other foods with added sugar. ? Full-fat dairy products.  Do not salt foods before eating.  Try to eat at least 2 vegetarian meals each week.  Eat more home-cooked food and less restaurant, buffet, and fast food.  When eating at a restaurant, ask that your food be prepared with less salt or no salt, if possible. What foods are recommended? The items listed may not be a complete list. Talk with your dietitian about what dietary choices are best for you. Grains Whole-grain or whole-wheat bread. Whole-grain or whole-wheat pasta. Brown rice. Orpah Cobb. Bulgur. Whole-grain and low-sodium cereals. Pita bread. Low-fat, low-sodium crackers. Whole-wheat flour tortillas. Vegetables Fresh or frozen vegetables (raw, steamed, roasted, or grilled). Low-sodium or reduced-sodium tomato and vegetable juice. Low-sodium or reduced-sodium tomato sauce and tomato paste. Low-sodium or reduced-sodium canned vegetables. Fruits All fresh, dried, or frozen fruit. Canned fruit in natural juice (without added sugar). Meat and other protein foods Skinless chicken or [place]. Ground chicken or [place]. Pork with fat trimmed off. Fish and seafood. Egg whites. Dried  beans, peas, or lentils. Unsalted nuts, nut butters, and seeds. Unsalted canned beans. Lean cuts of beef with fat trimmed off. Low-sodium, lean deli meat. Dairy Low-fat (1%) or fat-free (skim) milk. Fat-free, low-fat, or reduced-fat cheeses. Nonfat, low-sodium ricotta or cottage cheese. Low-fat or nonfat yogurt. Low-fat, low-sodium cheese. Fats and oils Soft margarine without trans fats. Vegetable oil. Low-fat, reduced-fat, or light mayonnaise and salad dressings (reduced-sodium). Canola, safflower, olive, soybean, and sunflower oils. Avocado. Seasoning and other foods Herbs. Spices. Seasoning mixes without salt. Unsalted popcorn and pretzels. Fat-free sweets. What foods are not recommended? The items listed may not be a complete list. Talk with your dietitian about what dietary choices are best for you. Grains Baked goods made with fat, such as croissants, muffins, or some breads. Dry pasta or rice meal packs. Vegetables Creamed or fried vegetables. Vegetables in a cheese sauce.  Regular canned vegetables (not low-sodium or reduced-sodium). Regular canned tomato sauce and paste (not low-sodium or reduced-sodium). Regular tomato and vegetable juice (not low-sodium or reduced-sodium). Rosita Fire. Olives. Fruits Canned fruit in a light or heavy syrup. Fried fruit. Fruit in cream or butter sauce. Meat and other protein foods Fatty cuts of meat. Ribs. Fried meat. Tomasa Blase. Sausage. Bologna and other processed lunch meats. Salami. Fatback. Hotdogs. Bratwurst. Salted nuts and seeds. Canned beans with added salt. Canned or smoked fish. Whole eggs or egg yolks. Chicken or [place] with skin. Dairy Whole or 2% milk, cream, and half-and-half. Whole or full-fat cream cheese. Whole-fat or sweetened yogurt. Full-fat cheese. Nondairy creamers. Whipped toppings. Processed cheese and cheese spreads. Fats and oils Butter. Stick margarine. Lard. Shortening. Ghee. Bacon fat. Tropical oils, such as coconut, palm kernel, or  palm oil. Seasoning and other foods Salted popcorn and pretzels. Onion salt, garlic salt, seasoned salt, table salt, and sea salt. Worcestershire sauce. Tartar sauce. Barbecue sauce. Teriyaki sauce. Soy sauce, including reduced-sodium. Steak sauce. Canned and packaged gravies. Fish sauce. Oyster sauce. Cocktail sauce. Horseradish that you find on the shelf. Ketchup. Mustard. Meat flavorings and tenderizers. Bouillon cubes. Hot sauce and Tabasco sauce. Premade or packaged marinades. Premade or packaged taco seasonings. Relishes. Regular salad dressings. Where to find more information:  National Heart, Lung, and Blood Institute: PopSteam.is  American Heart Association: www.heart.org Summary  The DASH eating plan is a healthy eating plan that has been shown to reduce high blood pressure (hypertension). It may also reduce your risk for type 2 diabetes, heart disease, and stroke.  With the DASH eating plan, you should limit salt (sodium) intake to 2,300 mg a day. If you have hypertension, you may need to reduce your sodium intake to 1,500 mg a day.  When on the DASH eating plan, aim to eat more fresh fruits and vegetables, whole grains, lean proteins, low-fat dairy, and heart-healthy fats.  Work with your health care provider or diet and nutrition specialist (dietitian) to adjust your eating plan to your individual calorie needs. This information is not intended to replace advice given to you by your health care provider. Make sure you discuss any questions you have with your health care provider. Document Released: 12/18/2010 Document Revised: 12/23/2015 Document Reviewed: 12/23/2015 Elsevier Interactive Patient Education  2018 [employer].      Chaya Jan, MD [place] Al[facility] Chyle

## 2017-11-26 ENCOUNTER — Encounter: Payer: Self-pay | Admitting: Internal Medicine

## 2017-11-26 ENCOUNTER — Ambulatory Visit: Payer: BC Managed Care – PPO | Admitting: Internal Medicine

## 2017-11-26 VITALS — BP 130/80 | HR 72 | Temp 97.9°F | Wt 208.0 lb

## 2017-11-26 DIAGNOSIS — I10 Essential (primary) hypertension: Secondary | ICD-10-CM

## 2017-11-26 DIAGNOSIS — Z7901 Long term (current) use of anticoagulants: Secondary | ICD-10-CM | POA: Insufficient documentation

## 2017-11-26 DIAGNOSIS — E785 Hyperlipidemia, unspecified: Secondary | ICD-10-CM

## 2017-11-26 DIAGNOSIS — I480 Paroxysmal atrial fibrillation: Secondary | ICD-10-CM

## 2017-11-26 DIAGNOSIS — I4891 Unspecified atrial fibrillation: Secondary | ICD-10-CM | POA: Insufficient documentation

## 2017-11-26 NOTE — Patient Instructions (Signed)
-I am happy to see that your blood pressure continues to be well controlled. -We will plan to see you back in 3 to 4 months. -No medication changes today. -Labs at next visit, please come fasting.   DASH Eating Plan DASH stands for "Dietary Approaches to Stop Hypertension." The DASH eating plan is a healthy eating plan that has been shown to reduce high blood pressure (hypertension). It may also reduce your risk for type 2 diabetes, heart disease, and stroke. The DASH eating plan may also help with weight loss. What are tips for following this plan? General guidelines  Avoid eating more than 2,300 mg (milligrams) of salt (sodium) a day. If you have hypertension, you may need to reduce your sodium intake to 1,500 mg a day.  Limit alcohol intake to no more than 1 drink a day for nonpregnant women and 2 drinks a day for men. One drink equals 12 oz of beer, 5 oz of wine, or 1 oz of hard liquor.  Work with your health care provider to maintain a healthy body weight or to lose weight. Ask what an ideal weight is for you.  Get at least 30 minutes of exercise that causes your heart to beat faster (aerobic exercise) most days of the week. Activities may include walking, swimming, or biking.  Work with your health care provider or diet and nutrition specialist (dietitian) to adjust your eating plan to your individual calorie needs. Reading food labels  Check food labels for the amount of sodium per serving. Choose foods with less than 5 percent of the Daily Value of sodium. Generally, foods with less than 300 mg of sodium per serving fit into this eating plan.  To find whole grains, look for the word "whole" as the first word in the ingredient list. Shopping  Buy products labeled as "low-sodium" or "no salt added."  Buy fresh foods. Avoid canned foods and premade or frozen meals. Cooking  Avoid adding salt when cooking. Use salt-free seasonings or herbs instead of table salt or sea salt. Check  with your health care provider or pharmacist before using salt substitutes.  Do not fry foods. Cook foods using healthy methods such as baking, boiling, grilling, and broiling instead.  Cook with heart-healthy oils, such as olive, canola, soybean, or sunflower oil. Meal planning   Eat a balanced diet that includes: ? 5 or more servings of fruits and vegetables each day. At each meal, try to fill half of your plate with fruits and vegetables. ? Up to 6-8 servings of whole grains each day. ? Less than 6 oz of lean meat, poultry, or fish each day. A 3-oz serving of meat is about the same size as a deck of cards. One egg equals 1 oz. ? 2 servings of low-fat dairy each day. ? A serving of nuts, seeds, or beans 5 times each week. ? Heart-healthy fats. Healthy fats called Omega-3 fatty acids are found in foods such as flaxseeds and coldwater fish, like sardines, salmon, and mackerel.  Limit how much you eat of the following: ? Canned or prepackaged foods. ? Food that is high in trans fat, such as fried foods. ? Food that is high in saturated fat, such as fatty meat. ? Sweets, desserts, sugary drinks, and other foods with added sugar. ? Full-fat dairy products.  Do not salt foods before eating.  Try to eat at least 2 vegetarian meals each week.  Eat more home-cooked food and less restaurant, buffet, and fast food.  When eating at a restaurant, ask that your food be prepared with less salt or no salt, if possible. What foods are recommended? The items listed may not be a complete list. Talk with your dietitian about what dietary choices are best for you. Grains Whole-grain or whole-wheat bread. Whole-grain or whole-wheat pasta. Brown rice. Modena Morrow. Bulgur. Whole-grain and low-sodium cereals. Pita bread. Low-fat, low-sodium crackers. Whole-wheat flour tortillas. Vegetables Fresh or frozen vegetables (raw, steamed, roasted, or grilled). Low-sodium or reduced-sodium tomato and  vegetable juice. Low-sodium or reduced-sodium tomato sauce and tomato paste. Low-sodium or reduced-sodium canned vegetables. Fruits All fresh, dried, or frozen fruit. Canned fruit in natural juice (without added sugar). Meat and other protein foods Skinless chicken or [place]. Ground chicken or [place]. Pork with fat trimmed off. Fish and seafood. Egg whites. Dried beans, peas, or lentils. Unsalted nuts, nut butters, and seeds. Unsalted canned beans. Lean cuts of beef with fat trimmed off. Low-sodium, lean deli meat. Dairy Low-fat (1%) or fat-free (skim) milk. Fat-free, low-fat, or reduced-fat cheeses. Nonfat, low-sodium ricotta or cottage cheese. Low-fat or nonfat yogurt. Low-fat, low-sodium cheese. Fats and oils Soft margarine without trans fats. Vegetable oil. Low-fat, reduced-fat, or light mayonnaise and salad dressings (reduced-sodium). Canola, safflower, olive, soybean, and sunflower oils. Avocado. Seasoning and other foods Herbs. Spices. Seasoning mixes without salt. Unsalted popcorn and pretzels. Fat-free sweets. What foods are not recommended? The items listed may not be a complete list. Talk with your dietitian about what dietary choices are best for you. Grains Baked goods made with fat, such as croissants, muffins, or some breads. Dry pasta or rice meal packs. Vegetables Creamed or fried vegetables. Vegetables in a cheese sauce. Regular canned vegetables (not low-sodium or reduced-sodium). Regular canned tomato sauce and paste (not low-sodium or reduced-sodium). Regular tomato and vegetable juice (not low-sodium or reduced-sodium). Angie Fava. Olives. Fruits Canned fruit in a light or heavy syrup. Fried fruit. Fruit in cream or butter sauce. Meat and other protein foods Fatty cuts of meat. Ribs. Fried meat. Berniece Salines. Sausage. Bologna and other processed lunch meats. Salami. Fatback. Hotdogs. Bratwurst. Salted nuts and seeds. Canned beans with added salt. Canned or smoked fish. Whole eggs or  egg yolks. Chicken or [place] with skin. Dairy Whole or 2% milk, cream, and half-and-half. Whole or full-fat cream cheese. Whole-fat or sweetened yogurt. Full-fat cheese. Nondairy creamers. Whipped toppings. Processed cheese and cheese spreads. Fats and oils Butter. Stick margarine. Lard. Shortening. Ghee. Bacon fat. Tropical oils, such as coconut, palm kernel, or palm oil. Seasoning and other foods Salted popcorn and pretzels. Onion salt, garlic salt, seasoned salt, table salt, and sea salt. Worcestershire sauce. Tartar sauce. Barbecue sauce. Teriyaki sauce. Soy sauce, including reduced-sodium. Steak sauce. Canned and packaged gravies. Fish sauce. Oyster sauce. Cocktail sauce. Horseradish that you find on the shelf. Ketchup. Mustard. Meat flavorings and tenderizers. Bouillon cubes. Hot sauce and Tabasco sauce. Premade or packaged marinades. Premade or packaged taco seasonings. Relishes. Regular salad dressings. Where to find more information:  National Heart, Lung, and [place]: https://wilson-eaton.com/  American Heart Association: www.heart.org Summary  The DASH eating plan is a healthy eating plan that has been shown to reduce high blood pressure (hypertension). It may also reduce your risk for type 2 diabetes, heart disease, and stroke.  With the DASH eating plan, you should limit salt (sodium) intake to 2,300 mg a day. If you have hypertension, you may need to reduce your sodium intake to 1,500 mg a day.  When on the DASH eating plan,  aim to eat more fresh fruits and vegetables, whole grains, lean proteins, low-fat dairy, and heart-healthy fats.  Work with your health care provider or diet and nutrition specialist (dietitian) to adjust your eating plan to your individual calorie needs. This information is not intended to replace advice given to you by your health care provider. Make sure you discuss any questions you have with your health care provider. Document Released: 12/18/2010  Document Revised: 12/23/2015 Document Reviewed: 12/23/2015 Elsevier Interactive Patient Education  [employer].

## 2017-12-17 ENCOUNTER — Other Ambulatory Visit: Payer: Self-pay | Admitting: Internal Medicine

## 2017-12-22 ENCOUNTER — Other Ambulatory Visit: Payer: Self-pay | Admitting: *Deleted

## 2017-12-22 MED ORDER — METOPROLOL TARTRATE 25 MG PO TABS: 25.0000 mg | ORAL_TABLET | Freq: Two times a day (BID) | ORAL | 1 refills | Status: DC

## 2018-03-01 ENCOUNTER — Other Ambulatory Visit (HOSPITAL_COMMUNITY): Payer: Self-pay | Admitting: Nurse Practitioner

## 2018-03-01 ENCOUNTER — Other Ambulatory Visit (HOSPITAL_COMMUNITY): Payer: Self-pay | Admitting: Internal Medicine

## 2018-04-14 ENCOUNTER — Encounter: Payer: BC Managed Care – PPO | Admitting: Internal Medicine

## 2018-05-17 ENCOUNTER — Other Ambulatory Visit: Payer: Self-pay | Admitting: *Deleted

## 2018-05-17 IMAGING — MR MR MRA HEAD W/O CM
9 of 12 series · 28 of 48 positions shown · non-contrast
Comparison: Head CT without contrast 10/27/2015.

CLINICAL DATA: 70-year-old male with headache, with 40 minutes of
transient right eye vision loss. Initial encounter.

EXAM:
MRI HEAD WITHOUT CONTRAST
MRA HEAD WITHOUT CONTRAST
TECHNIQUE: Multiplanar, multiecho pulse sequences of the brain and surrounding
structures were obtained without intravenous contrast. Angiographic
images of the head were obtained using MRA technique without
contrast.

[Series 2: FLAIR · sagittal · 5.0mm · 0.47mm/px · 1 of 24 slices shown (1 of 2)]
[im 1/24]
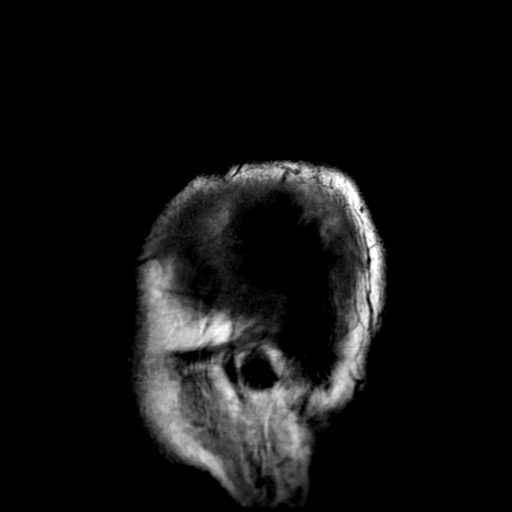

[Series 4: DWI · axial · 3.0mm · 0.94mm/px · z∈[-91,+52]mm · 6 of 98 slices shown (1 of 2)]
[im 1/98]
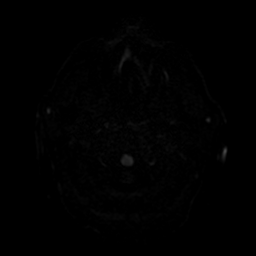
[im 20/98]
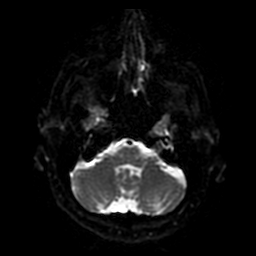
[im 39/98]
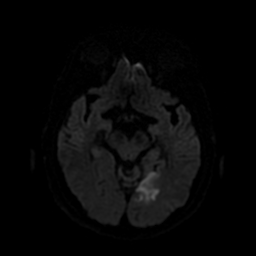
[im 59/98]
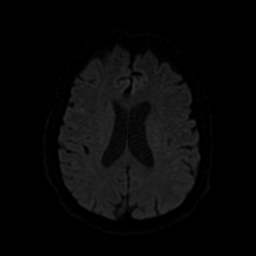
[im 78/98]
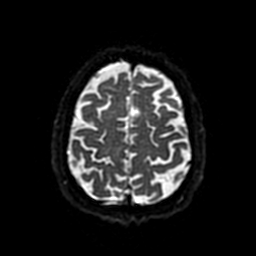
[im 98/98]
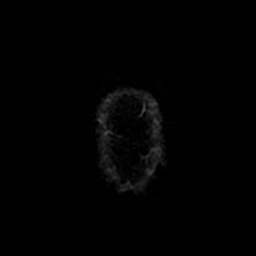

[Series 6: ax (id) 2 · axial · 1.0mm · 0.43mm/px · z∈[-84,-20]mm · 6 of 184 slices shown]
[im 1/184]
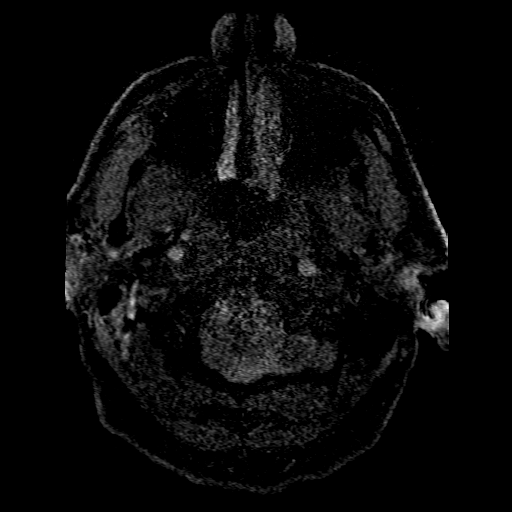
[im 37/184]
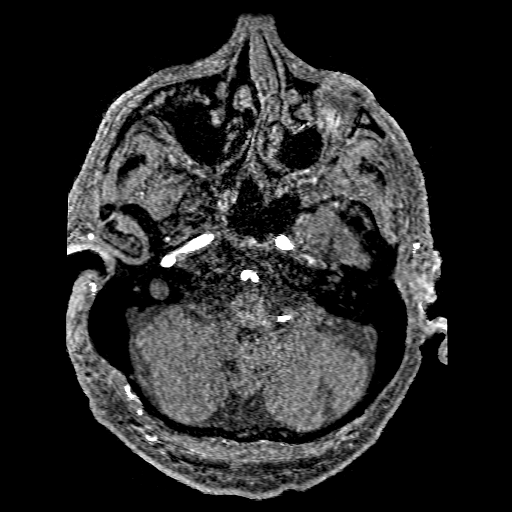
[im 55/184]
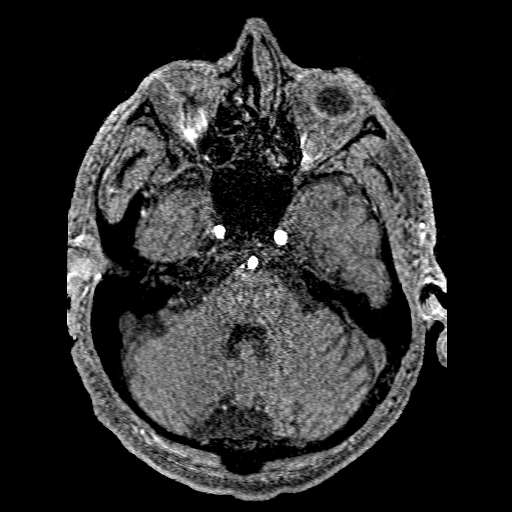
[im 74/184]
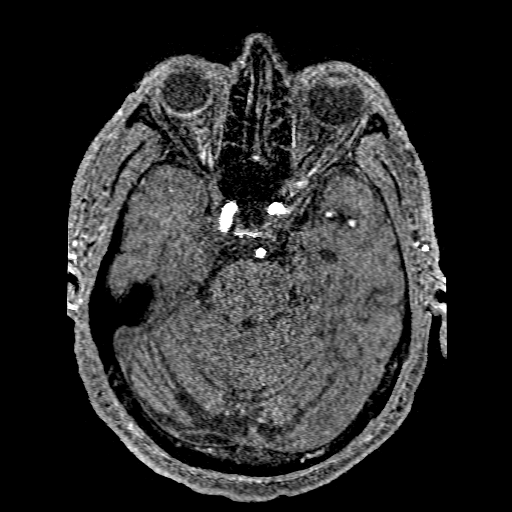
[im 110/184]
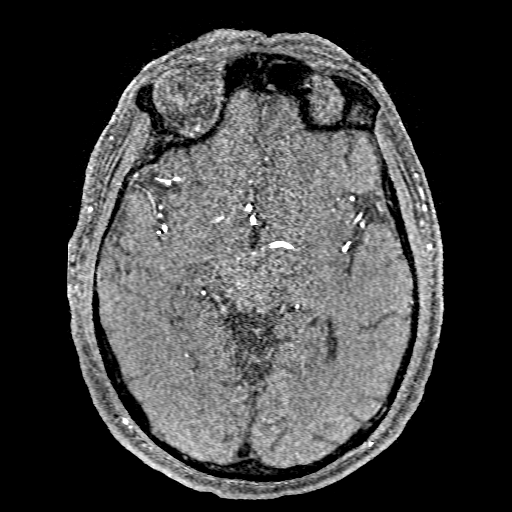
[im 129/184]
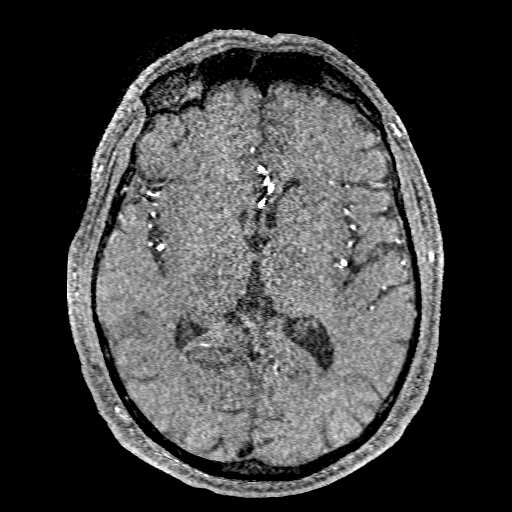

[Series 7: DWI · coronal · 5.0mm · 0.94mm/px · 4 of 72 slices shown (2 of 2)]
[im 1/72]
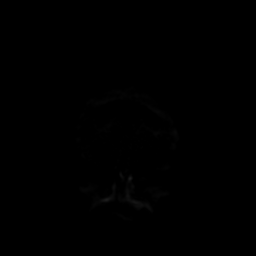
[im 24/72]
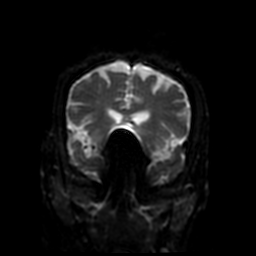
[im 48/72]
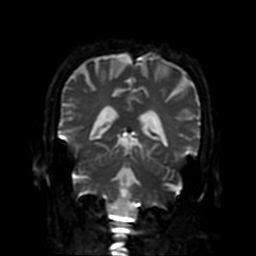
[im 72/72]
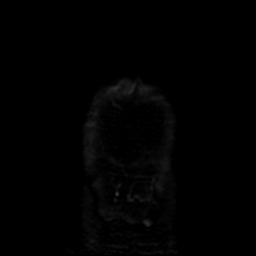

[Series 8: T2 · axial · 5.0mm · 0.47mm/px · z∈[-91,+52]mm · 2 of 25 slices shown (1 of 2)]
[im 1/25]
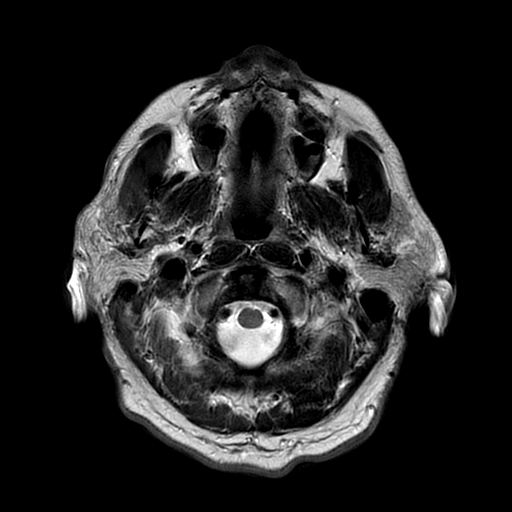
[im 25/25]
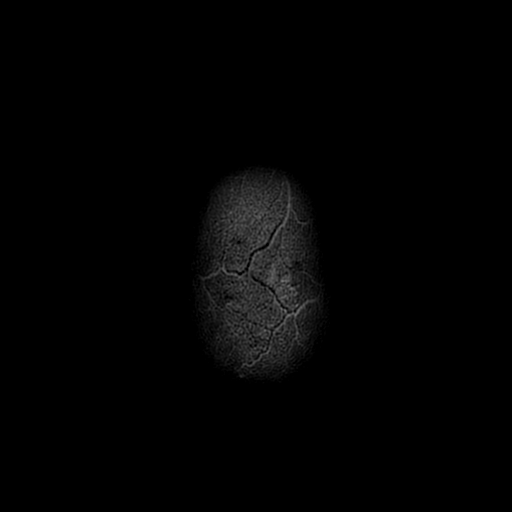

[Series 9: FLAIR · axial · 5.0mm · 0.47mm/px · z∈[-91,+52]mm · 2 of 25 slices shown (2 of 2)]
[im 1/25]
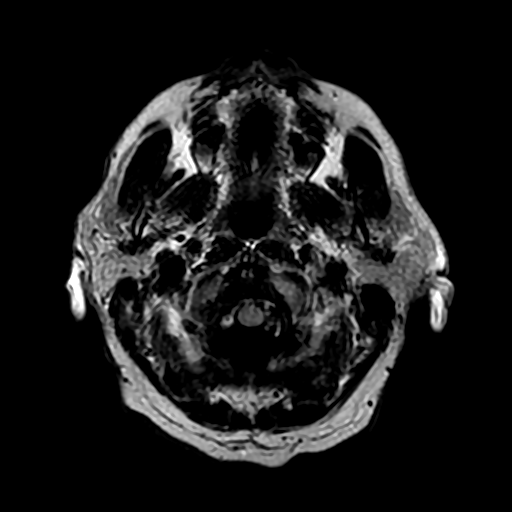
[im 25/25]
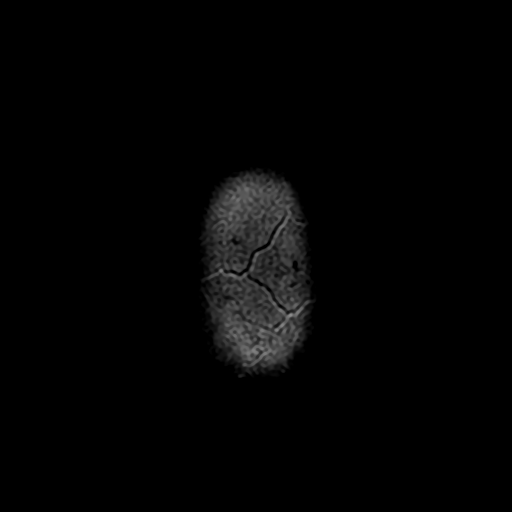

[Series 12: T2 · coronal · 5.0mm · 0.39mm/px · 2 of 30 slices shown (2 of 2)]
[im 1/30]
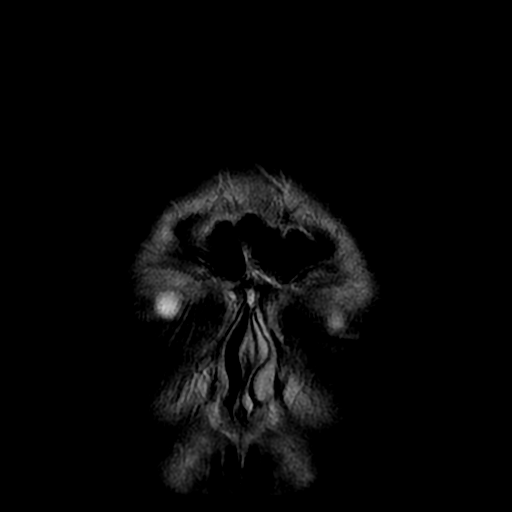
[im 30/30]
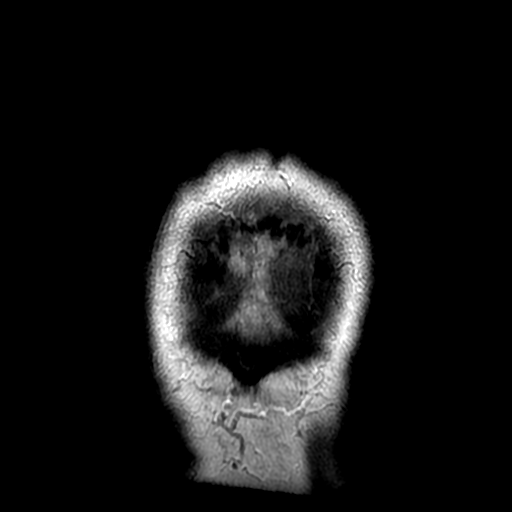

[Series 450: ADC · axial · 3.0mm · 0.94mm/px · z∈[-91,+52]mm · 3 of 49 slices shown (1 of 2)]
[im 1/49]
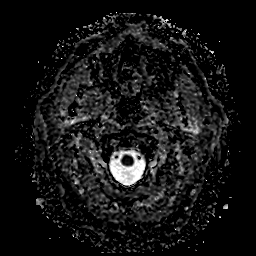
[im 25/49]
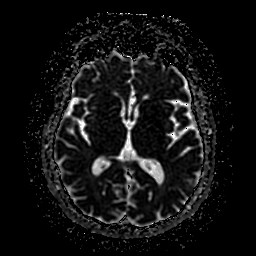
[im 49/49]
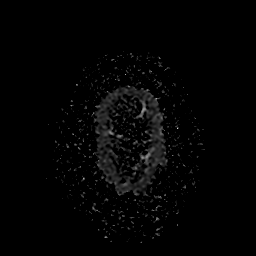

[Series 750: ADC · coronal · 5.0mm · 0.94mm/px · 2 of 36 slices shown (2 of 2)]
[im 1/36]
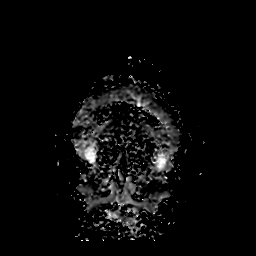
[im 36/36]
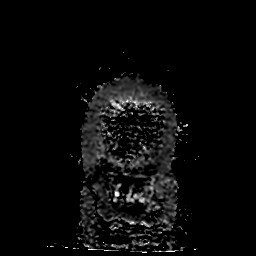

[28 of 48 positions shown; findings below may reference images not displayed]

FINDINGS: MRI HEAD FINDINGS

Brain: Patchy and confluent restricted diffusion affecting the
medial inferior left occipital lobe along a distance of about 4.5-5
cm. This affects the calcarine region. Associated mild T2 and FLAIR
hyperintensity compatible with cytotoxic edema. No associated
hemorrhage or mass effect.

No other restricted diffusion. No midline shift, mass effect,
evidence of mass lesion, ventriculomegaly, extra-axial collection or
acute intracranial hemorrhage. Cervicomedullary junction and
pituitary are within normal limits.

Elsewhere essentially normal for age gray and white matter signal
throughout the brain. Deep gray matter nuclei, the brainstem, and
cerebellum appear normal. No chronic cerebral blood products
identified.

Vascular: Major intracranial vascular flow voids are preserved.

Skull and upper cervical spine: Negative. Normal bone marrow signal.

Sinuses/Orbits: Visualized paranasal sinuses and mastoids are stable
and well pneumatized. Bilateral orbits soft tissues in the optic
chiasm appear normal. Negative scalp soft tissues.

Other: Visible internal auditory structures appear normal.

MRA HEAD FINDINGS

Antegrade flow in the posterior circulation. Visible distal
vertebral arteries appear normal without stenosis, the right is
mildly dominant. Left PICA origin is normal. Patent basilar artery
without stenosis. Dominant appearing right AICA origin is normal.
Normal SCA and PCA origins. Posterior communicating arteries are
diminutive or absent. The right PCA is normal.

The proximal left PCA is normal. Suggestion of stenosis versus
occlusion of the inferior left P3 division as seen on series 655,
image 3. Other left PCA branches appear normal.

Antegrade flow in both ICA siphons. No siphon stenosis. Ophthalmic
artery origins are normal. Patent carotid termini. Normal left MCA
and ACA origins. Mild irregularity at the right ICA terminus
affecting both the right ACA and MCA origin (series 653, image 7).
Anterior communicating artery and visualized ACA branches are
normal. Left MCA M1 segment, bifurcation, and visible left MCA
branches are normal. Right MCA M1 segment, trifurcation, and visible
right MCA branches are within normal limits.
IMPRESSION: 1. Positive for acute patchy small to moderate size left PCA
infarct. Mild cytotoxic edema with no associated hemorrhage or mass
effect.
2. Negative for emergent large vessel occlusion, but associated left
inferior P3 branch thromboembolic stenosis or occlusion suspected.
Otherwise normal left PCA and posterior circulation.
3. Mild atherosclerosis at the right ICA terminus affecting the
right MCA and ACA origins without significant stenosis. Otherwise
negative anterior circulation.
4. Otherwise negative for age noncontrast MRI appearance of the
brain.

## 2018-05-17 MED ORDER — PRAVASTATIN SODIUM 20 MG PO TABS: 20.0000 mg | ORAL_TABLET | Freq: Every day | ORAL | 1 refills | Status: DC

## 2018-08-23 ENCOUNTER — Encounter: Payer: Self-pay | Admitting: Internal Medicine

## 2018-08-23 ENCOUNTER — Ambulatory Visit (INDEPENDENT_AMBULATORY_CARE_PROVIDER_SITE_OTHER): Payer: BC Managed Care – PPO | Admitting: Internal Medicine

## 2018-08-23 ENCOUNTER — Other Ambulatory Visit (HOSPITAL_COMMUNITY): Payer: Self-pay | Admitting: Internal Medicine

## 2018-08-23 ENCOUNTER — Other Ambulatory Visit: Payer: Self-pay

## 2018-08-23 VITALS — BP 168/90 | HR 61 | Temp 98.5°F | Ht 67.0 in | Wt 203.3 lb

## 2018-08-23 DIAGNOSIS — Z0001 Encounter for general adult medical examination with abnormal findings: Secondary | ICD-10-CM | POA: Diagnosis not present

## 2018-08-23 DIAGNOSIS — E785 Hyperlipidemia, unspecified: Secondary | ICD-10-CM | POA: Diagnosis not present

## 2018-08-23 DIAGNOSIS — H6123 Impacted cerumen, bilateral: Secondary | ICD-10-CM

## 2018-08-23 DIAGNOSIS — E538 Deficiency of other specified B group vitamins: Secondary | ICD-10-CM | POA: Insufficient documentation

## 2018-08-23 DIAGNOSIS — I480 Paroxysmal atrial fibrillation: Secondary | ICD-10-CM

## 2018-08-23 DIAGNOSIS — R202 Paresthesia of skin: Secondary | ICD-10-CM | POA: Diagnosis not present

## 2018-08-23 DIAGNOSIS — Z0000 Encounter for general adult medical examination without abnormal findings: Secondary | ICD-10-CM

## 2018-08-23 DIAGNOSIS — I10 Essential (primary) hypertension: Secondary | ICD-10-CM | POA: Diagnosis not present

## 2018-08-23 DIAGNOSIS — Z7901 Long term (current) use of anticoagulants: Secondary | ICD-10-CM

## 2018-08-23 LAB — COMPREHENSIVE METABOLIC PANEL
ALT: 20 U/L (ref 0–53)
AST: 18 U/L (ref 0–37)
Albumin: 4.3 g/dL (ref 3.5–5.2)
Alkaline Phosphatase: 46 U/L (ref 39–117)
BUN: 16 mg/dL (ref 6–23)
CO2: 30 mEq/L (ref 19–32)
Calcium: 9.3 mg/dL (ref 8.4–10.5)
Chloride: 103 mEq/L (ref 96–112)
Creatinine, Ser: 0.75 mg/dL (ref 0.40–1.50)
GFR: 101.97 mL/min (ref >60.00)
Glucose, Bld: 91 mg/dL (ref 70–99)
Potassium: 4.2 mEq/L (ref 3.5–5.1)
Sodium: 141 mEq/L (ref 135–145)
Total Bilirubin: 0.7 mg/dL (ref 0.2–1.2)
Total Protein: 6.7 g/dL (ref 6.0–8.3)

## 2018-08-23 LAB — CBC WITH DIFFERENTIAL/PLATELET
Basophils Absolute: 0 10*3/uL (ref 0.0–0.1)
Basophils Relative: 0.8 % (ref 0.0–3.0)
Eosinophils Absolute: 0.2 10*3/uL (ref 0.0–0.7)
Eosinophils Relative: 4.3 % (ref 0.0–5.0)
HCT: 42.3 % (ref 39.0–52.0)
Hemoglobin: 14.6 g/dL (ref 13.0–17.0)
Lymphocytes Relative: 29.8 % (ref 12.0–46.0)
Lymphs Abs: 1.4 10*3/uL (ref 0.7–4.0)
MCHC: 34.5 g/dL (ref 30.0–36.0)
MCV: 87.4 fl (ref 78.0–100.0)
Monocytes Absolute: 0.5 10*3/uL (ref 0.1–1.0)
Monocytes Relative: 10.1 % (ref 3.0–12.0)
Neutro Abs: 2.7 10*3/uL (ref 1.4–7.7)
Neutrophils Relative %: 55 % (ref 43.0–77.0)
Platelets: 189 10*3/uL (ref 150.0–400.0)
RBC: 4.84 Mil/uL (ref 4.22–5.81)
RDW: 13.5 % (ref 11.5–15.5)
WBC: 4.8 10*3/uL (ref 4.0–10.5)

## 2018-08-23 LAB — LIPID PANEL
Cholesterol: 169 mg/dL (ref 0–200)
HDL: 35 mg/dL — ABNORMAL LOW (ref >39.00)
LDL Cholesterol: 113 mg/dL — ABNORMAL HIGH (ref 0–99)
NonHDL: 133.6
Total CHOL/HDL Ratio: 5
Triglycerides: 103 mg/dL (ref 0.0–149.0)
VLDL: 20.6 mg/dL (ref 0.0–40.0)

## 2018-08-23 LAB — VITAMIN D 25 HYDROXY (VIT D DEFICIENCY, FRACTURES): VITD: 34.65 ng/mL (ref 30.00–100.00)

## 2018-08-23 LAB — HEMOGLOBIN A1C: Hgb A1c MFr Bld: 5.4 % (ref 4.6–6.5)

## 2018-08-23 LAB — TSH: TSH: 1.9 u[IU]/mL (ref 0.35–4.50)

## 2018-08-23 LAB — VITAMIN B12: Vitamin B-12: 136 pg/mL — ABNORMAL LOW (ref 211–911)

## 2018-08-23 NOTE — Addendum Note (Signed)
Addended by: Elmer Picker on: 08/23/2018 09:53 AM   Modules accepted: Orders

## 2018-08-23 NOTE — Progress Notes (Signed)
Established Patient Office Visit     CC/Reason for Visit: Annual preventive exam and subsequent Medicare wellness visit  HPI: Joel Payne is a 73 y.o. male who is coming in today for the above mentioned reasons. Past Medical History is significant for: Hypertension that is not currently well controlled, hyperlipidemia, paroxysmal A. fib anticoagulated on Eliquis.    He has routine eye and dental care, he walks 2 to 4 miles every day with his dogs.  He is due for tetanus and shingles vaccination.  He has been complaining of some bilateral tingling of his feet around his ankles and up to about mid shins bilaterally.   Past Medical/Surgical History: Past Medical History:  Diagnosis Date   ALLERGIC RHINITIS 11/08/2006   BENIGN PROSTATIC HYPERTROPHY 10/12/2006   CORONARY ARTERY DISEASE 10/12/2006   Catheterization was normal 2012, mild nonobstructive coronary disease, normal LV function   HYPERLIPIDEMIA 11/08/2006   HYPERTENSION 10/12/2006   Kidney stone    in [place], HX OF 10/12/2006   PPD positive    Shortness of breath    April, 2012  /  catheterization May, 2012 mild nonobstructive coronary disease   Stroke [facility])    TIA (transient ischemic attack) 10/2015   Urinary frequency 10/18/2009    Past Surgical History:  Procedure Laterality Date   CARDIAC CATHETERIZATION  2012   non radical prostatectomy  08/2011   PROSTATE BIOPSY  2008   TEE WITHOUT CARDIOVERSION N/A 10/31/2015   Procedure: TRANSESOPHAGEAL ECHOCARDIOGRAM (TEE) will alos have loop;  Surgeon: Josue Hector, MD;  Location: [facility] ENDOSCOPY;  Service: Cardiovascular;  Laterality: N/A;   TONSILLECTOMY      Social History:  reports that he has never smoked. He has never used smokeless tobacco. He reports that he does not drink alcohol or use drugs.  Allergies: Allergies  Allergen Reactions   Simvastatin Other (See Comments)    Leg cramps    Family History:  Family History    Problem Relation Age of Onset   Colon cancer Neg Hx    Esophageal cancer Neg Hx    Rectal cancer Neg Hx    Stomach cancer Neg Hx      Current Outpatient Medications:    ELIQUIS 5 MG TABS tablet, TAKE 1 TABLET BY MOUTH TWICE A DAY, Disp: 180 tablet, Rfl: 1   losartan-hydrochlorothiazide (HYZAAR) 100-12.5 MG tablet, Take 1 tablet by mouth daily., Disp: 90 tablet, Rfl: 3   metoprolol tartrate (LOPRESSOR) 25 MG tablet, Take 1 tablet (25 mg total) by mouth 2 (two) times daily., Disp: 180 tablet, Rfl: 1   pravastatin (PRAVACHOL) 20 MG tablet, Take 1 tablet (20 mg total) by mouth daily., Disp: 90 tablet, Rfl: 1  Review of Systems:  Constitutional: Denies fever, chills, diaphoresis, appetite change and fatigue.  HEENT: Denies photophobia, eye pain, redness, hearing loss, ear pain, congestion, sore throat, rhinorrhea, sneezing, mouth sores, trouble swallowing, neck pain, neck stiffness and tinnitus.   Respiratory: Denies SOB, DOE, cough, chest tightness,  and wheezing.   Cardiovascular: Denies chest pain, palpitations and leg swelling.  Gastrointestinal: Denies nausea, vomiting, abdominal pain, diarrhea, constipation, blood in stool and abdominal distention.  Genitourinary: Denies dysuria, urgency, frequency, hematuria, flank pain and difficulty urinating.  Endocrine: Denies: hot or cold intolerance, sweats, changes in hair or nails, polyuria, polydipsia. Musculoskeletal: Denies myalgias, back pain, joint swelling, arthralgias and gait problem.  Skin: Denies pallor, rash and wound.  Neurological: Denies dizziness, seizures, syncope, weakness, light-headedness, numbness  and headaches.  Hematological: Denies adenopathy. Easy bruising, personal or family bleeding history  Psychiatric/Behavioral: Denies suicidal ideation, mood changes, confusion, nervousness, sleep disturbance and agitation    Physical Exam: Vitals:   08/23/18 0916  BP: (!) 168/90  Pulse: 61  Temp: 98.5 F (36.9 C)   TempSrc: Temporal  SpO2: 97%  Weight: 203 lb 4.8 oz (92.2 kg)  Height: 5' 7"  (1.702 m)    Body mass index is 31.84 kg/m.   Constitutional: NAD, calm, comfortable Eyes: PERRL, lids and conjunctivae normal, wears corrective lenses ENMT: Mucous membranes are moist.  Tympanic membrane is obstructed by cerumen bilaterally. Neck: normal, supple, no masses, no thyromegaly Respiratory: clear to auscultation bilaterally, no wheezing, no crackles. Normal respiratory effort. No accessory muscle use.  Cardiovascular: Regular rate and rhythm, no murmurs / rubs / gallops. No extremity edema. 2+ pedal pulses. No carotid bruits.  Abdomen: no tenderness, no masses palpated. No hepatosplenomegaly. Bowel sounds positive.  Musculoskeletal: no clubbing / cyanosis. No joint deformity upper and lower extremities. Good ROM, no contractures. Normal muscle tone.  Skin: no rashes, lesions, ulcers. No induration Neurologic: CN 2-12 grossly intact. Sensation intact, DTR normal. Strength 5/5 in all 4.  Psychiatric: Normal judgment and insight. Alert and oriented x 3. Normal mood.   Subsequent Medicare wellness visit   1. Risk factors, based on past  M,S,F -cardiovascular disease risk factors include age, gender, history of hypertension, history of hyperlipidemia   2.  Physical activities: Walks 2 to 4 miles a day   3.  Depression/mood:  Mood is stable, not depressed   4.  Hearing:  No issues   5.  ADL's: Independent in all ADLs   6.  Fall risk:  Low fall risk   7.  Home safety: No problems identified   8.  Height weight, and visual acuity: Height and weight as above, visual acuity is 20/25 with each eye independently and together   9.  Counseling:  Advised low-sodium diet, routine cardiology follow-up   10. Lab orders based on risk factors: Laboratory update will be reviewed   11. Referral :  None today   12. Care plan:  Follow-up with me in 3 to 4 months   13. Cognitive assessment:  No cognitive  impairment   14. Screening: Patient provided with a written and personalized 5-10 year screening schedule in the AVS.   yes   15. Provider List Update:   PCP, cardiology  16. Advance Directives: Full code     Office Visit from 08/23/2018 in [facility]  PHQ-9 Total Score  0      Fall Risk  08/23/2018 04/12/2017 11/25/2015 09/18/2014 08/07/2014  Falls in the past year? 0 No No No No  Number falls in past yr: 0 - - - -  Injury with Fall? 0 - - - -  Risk for fall due to : - - - - -  Risk for fall due to: Comment - - - - -     Impression and Plan:  Encounter for preventive health examination -He has routine eye and dental care. -Due for tetanus and shingles vaccination series which he agrees to obtain at pharmacy. -Healthy lifestyle has been discussed in detail today. -Screening labs to be performed today. -Had a colonoscopy in 2014 and is a 10-year callback.  Chronic anticoagulation Paroxysmal atrial fibrillation (HCC) -Appears to be in sinus rhythm today, rate controlled on metoprolol, anticoagulated on Eliquis.  Essential hypertension  -Uncontrolled today. -He will  do ambulatory blood pressure monitoring and will contact us after he has 2 weeks worth of data for further treatment plan. -He is currently on metoprolol 25 mg twice daily and losartan/HCTZ 100/12.5 mg daily.  Dyslipidemia  -Last LDL was 84 in April 2019, recheck lipids today. -Had myalgias with simvastatin, no issues on pravastatin.  Tingling of both feet  -Rule out B12 deficiency, vitamin D deficiency and hypothyroidism. -If all above normal consider treating with Neurontin for neuropathy.  Impacted cerumen, bilateral -Cerumen Desimpaction  Warm water was applied and gentle ear lavage performed on bilateral ears. There were no complications and following the desimpaction the tympanic membranes were visible. Tympanic membranes are intact following the procedure. Auditory canals are normal.  The patient reported relief of symptoms after removal of cerumen.    Patient Instructions  -Nice seeing you today!!  -Lab work today; will notify you once results are available.  -Check Blood Pressure 2-3 a week and contact us once you have 2 weeks worth of data.  -Remember to get tetanus and shingles vaccines at your pharmacy.   Preventive Care 97 Years and Older, Male Preventive care refers to lifestyle choices and visits with your health care provider that can promote health and wellness. This includes:  A yearly physical exam. This is also called an annual well check.  Regular dental and eye exams.  Immunizations.  Screening for certain conditions.  Healthy lifestyle choices, such as diet and exercise. What can I expect for my preventive care visit? Physical exam Your health care provider will check:  Height and weight. These may be used to calculate body mass index (BMI), which is a measurement that tells if you are at a healthy weight.  Heart rate and blood pressure.  Your skin for abnormal spots. Counseling Your health care provider may ask you questions about:  Alcohol, tobacco, and drug use.  Emotional well-being.  Home and relationship well-being.  Sexual activity.  Eating habits.  History of falls.  Memory and ability to understand (cognition).  Work and work Statistician. What immunizations do I need?  Influenza (flu) vaccine  This is recommended every year. Tetanus, diphtheria, and pertussis (Tdap) vaccine  You may need a Td booster every 10 years. Varicella (chickenpox) vaccine  You may need this vaccine if you have not already been vaccinated. Zoster (shingles) vaccine  You may need this after age 36. Pneumococcal conjugate (PCV13) vaccine  One dose is recommended after age 63. Pneumococcal polysaccharide (PPSV23) vaccine  One dose is recommended after age 21. Measles, mumps, and rubella (MMR) vaccine  You may need at least one  dose of MMR if you were born in 1957 or later. You may also need a second dose. Meningococcal conjugate (MenACWY) vaccine  You may need this if you have certain conditions. Hepatitis A vaccine  You may need this if you have certain conditions or if you travel or work in places where you may be exposed to hepatitis A. Hepatitis B vaccine  You may need this if you have certain conditions or if you travel or work in places where you may be exposed to hepatitis B. Haemophilus influenzae type b (Hib) vaccine  You may need this if you have certain conditions. You may receive vaccines as individual doses or as more than one vaccine together in one shot (combination vaccines). Talk with your health care provider about the risks and benefits of combination vaccines. What tests do I need? Blood tests  Lipid and cholesterol levels. These  may be checked every 5 years, or more frequently depending on your overall health.  Hepatitis C test.  Hepatitis B test. Screening  Lung cancer screening. You may have this screening every year starting at age 33 if you have a 30-pack-year history of smoking and currently smoke or have quit within the past 15 years.  Colorectal cancer screening. All adults should have this screening starting at age 79 and continuing until age 32. Your health care provider may recommend screening at age 38 if you are at increased risk. You will have tests every 1-10 years, depending on your results and the type of screening test.  Prostate cancer screening. Recommendations will vary depending on your family history and other risks.  Diabetes screening. This is done by checking your blood sugar (glucose) after you have not eaten for a while (fasting). You may have this done every 1-3 years.  Abdominal aortic aneurysm (AAA) screening. You may need this if you are a current or former smoker.  Sexually transmitted disease (STD) testing. Follow these instructions at home: Eating  and drinking  Eat a diet that includes fresh fruits and vegetables, whole grains, lean protein, and low-fat dairy products. Limit your intake of foods with high amounts of sugar, saturated fats, and salt.  Take vitamin and mineral supplements as recommended by your health care provider.  Do not drink alcohol if your health care provider tells you not to drink.  If you drink alcohol: ? Limit how much you have to 0-2 drinks a day. ? Be aware of how much alcohol is in your drink. In the [place] one drink equals one 12 oz bottle of beer (355 mL), one 5 oz glass of wine (148 mL), or one 1 oz glass of hard liquor (44 mL). Lifestyle  Take daily care of your teeth and gums.  Stay active. Exercise for at least 30 minutes on 5 or more days each week.  Do not use any products that contain nicotine or tobacco, such as cigarettes, e-cigarettes, and chewing tobacco. If you need help quitting, ask your health care provider.  If you are sexually active, practice safe sex. Use a condom or other form of protection to prevent STIs (sexually transmitted infections).  Talk with your health care provider about taking a low-dose aspirin or statin. What's next?  Visit your health care provider once a year for a well check visit.  Ask your health care provider how often you should have your eyes and teeth checked.  Stay up to date on all vaccines. This information is not intended to replace advice given to you by your health care provider. Make sure you discuss any questions you have with your health care provider. Document Released: 01/25/2015 Document Revised: 12/23/2017 Document Reviewed: 12/23/2017 Elsevier Patient Education  2020 [place], MD [facility]

## 2018-08-23 NOTE — Patient Instructions (Signed)
-Nice seeing you today!!  -Lab work today; will notify you once results are available.  -Check Blood Pressure 2-3 a week and contact us once you have 2 weeks worth of data.  -Remember to get tetanus and shingles vaccines at your pharmacy.   Preventive Care 16 Years and Older, Male Preventive care refers to lifestyle choices and visits with your health care provider that can promote health and wellness. This includes:  A yearly physical exam. This is also called an annual well check.  Regular dental and eye exams.  Immunizations.  Screening for certain conditions.  Healthy lifestyle choices, such as diet and exercise. What can I expect for my preventive care visit? Physical exam Your health care provider will check:  Height and weight. These may be used to calculate body mass index (BMI), which is a measurement that tells if you are at a healthy weight.  Heart rate and blood pressure.  Your skin for abnormal spots. Counseling Your health care provider may ask you questions about:  Alcohol, tobacco, and drug use.  Emotional well-being.  Home and relationship well-being.  Sexual activity.  Eating habits.  History of falls.  Memory and ability to understand (cognition).  Work and work Statistician. What immunizations do I need?  Influenza (flu) vaccine  This is recommended every year. Tetanus, diphtheria, and pertussis (Tdap) vaccine  You may need a Td booster every 10 years. Varicella (chickenpox) vaccine  You may need this vaccine if you have not already been vaccinated. Zoster (shingles) vaccine  You may need this after age 73. Pneumococcal conjugate (PCV13) vaccine  One dose is recommended after age 73. Pneumococcal polysaccharide (PPSV23) vaccine  One dose is recommended after age 73. Measles, mumps, and rubella (MMR) vaccine  You may need at least one dose of MMR if you were born in 1957 or later. You may also need a second dose. Meningococcal  conjugate (MenACWY) vaccine  You may need this if you have certain conditions. Hepatitis A vaccine  You may need this if you have certain conditions or if you travel or work in places where you may be exposed to hepatitis A. Hepatitis B vaccine  You may need this if you have certain conditions or if you travel or work in places where you may be exposed to hepatitis B. Haemophilus influenzae type b (Hib) vaccine  You may need this if you have certain conditions. You may receive vaccines as individual doses or as more than one vaccine together in one shot (combination vaccines). Talk with your health care provider about the risks and benefits of combination vaccines. What tests do I need? Blood tests  Lipid and cholesterol levels. These may be checked every 5 years, or more frequently depending on your overall health.  Hepatitis C test.  Hepatitis B test. Screening  Lung cancer screening. You may have this screening every year starting at age 73 if you have a 30-pack-year history of smoking and currently smoke or have quit within the past 15 years.  Colorectal cancer screening. All adults should have this screening starting at age 73 and continuing until age 60. Your health care provider may recommend screening at age 73 if you are at increased risk. You will have tests every 1-10 years, depending on your results and the type of screening test.  Prostate cancer screening. Recommendations will vary depending on your family history and other risks.  Diabetes screening. This is done by checking your blood sugar (glucose) after you have not eaten  for a while (fasting). You may have this done every 1-3 years.  Abdominal aortic aneurysm (AAA) screening. You may need this if you are a current or former smoker.  Sexually transmitted disease (STD) testing. Follow these instructions at home: Eating and drinking  Eat a diet that includes fresh fruits and vegetables, whole grains, lean  protein, and low-fat dairy products. Limit your intake of foods with high amounts of sugar, saturated fats, and salt.  Take vitamin and mineral supplements as recommended by your health care provider.  Do not drink alcohol if your health care provider tells you not to drink.  If you drink alcohol: ? Limit how much you have to 0-2 drinks a day. ? Be aware of how much alcohol is in your drink. In the [place] one drink equals one 12 oz bottle of beer (355 mL), one 5 oz glass of wine (148 mL), or one 1 oz glass of hard liquor (44 mL). Lifestyle  Take daily care of your teeth and gums.  Stay active. Exercise for at least 30 minutes on 5 or more days each week.  Do not use any products that contain nicotine or tobacco, such as cigarettes, e-cigarettes, and chewing tobacco. If you need help quitting, ask your health care provider.  If you are sexually active, practice safe sex. Use a condom or other form of protection to prevent STIs (sexually transmitted infections).  Talk with your health care provider about taking a low-dose aspirin or statin. What's next?  Visit your health care provider once a year for a well check visit.  Ask your health care provider how often you should have your eyes and teeth checked.  Stay up to date on all vaccines. This information is not intended to replace advice given to you by your health care provider. Make sure you discuss any questions you have with your health care provider. Document Released: 01/25/2015 Document Revised: 12/23/2017 Document Reviewed: 12/23/2017 Elsevier Patient Education  2020 [employer].

## 2018-08-26 ENCOUNTER — Other Ambulatory Visit: Payer: Self-pay

## 2018-08-26 ENCOUNTER — Ambulatory Visit (INDEPENDENT_AMBULATORY_CARE_PROVIDER_SITE_OTHER): Payer: BC Managed Care – PPO

## 2018-08-26 DIAGNOSIS — E538 Deficiency of other specified B group vitamins: Secondary | ICD-10-CM | POA: Diagnosis not present

## 2018-08-26 MED ORDER — CYANOCOBALAMIN 1000 MCG/ML IJ SOLN
1000.0000 ug | Freq: Once | INTRAMUSCULAR | Status: AC
  Administered 2018-08-26: 1000 ug via INTRAMUSCULAR

## 2018-08-26 NOTE — Progress Notes (Signed)
Pt tolerated shot well 

## 2018-09-02 ENCOUNTER — Ambulatory Visit (INDEPENDENT_AMBULATORY_CARE_PROVIDER_SITE_OTHER): Payer: BC Managed Care – PPO | Admitting: *Deleted

## 2018-09-02 ENCOUNTER — Other Ambulatory Visit: Payer: Self-pay

## 2018-09-02 DIAGNOSIS — E538 Deficiency of other specified B group vitamins: Secondary | ICD-10-CM

## 2018-09-02 DIAGNOSIS — Z23 Encounter for immunization: Secondary | ICD-10-CM

## 2018-09-02 MED ORDER — CYANOCOBALAMIN 1000 MCG/ML IJ SOLN
1000.0000 ug | Freq: Once | INTRAMUSCULAR | Status: AC
  Administered 2018-09-02: 1000 ug via INTRAMUSCULAR

## 2018-09-02 NOTE — Progress Notes (Signed)
Patient here today for B-12 injection and flu vaccine. Injections administered with no reactions

## 2018-09-09 ENCOUNTER — Ambulatory Visit (INDEPENDENT_AMBULATORY_CARE_PROVIDER_SITE_OTHER): Payer: BC Managed Care – PPO | Admitting: *Deleted

## 2018-09-09 ENCOUNTER — Other Ambulatory Visit: Payer: Self-pay

## 2018-09-09 DIAGNOSIS — E538 Deficiency of other specified B group vitamins: Secondary | ICD-10-CM

## 2018-09-09 MED ORDER — CYANOCOBALAMIN 1000 MCG/ML IJ SOLN
1000.0000 ug | Freq: Once | INTRAMUSCULAR | Status: AC
  Administered 2018-09-09: 11:00:00 1000 ug via INTRAMUSCULAR

## 2018-09-09 NOTE — Progress Notes (Signed)
Patient in office for B-12 injection. Injection administered and no adverse reaction observed.

## 2018-09-12 ENCOUNTER — Other Ambulatory Visit: Payer: Self-pay | Admitting: Internal Medicine

## 2018-09-16 ENCOUNTER — Ambulatory Visit (INDEPENDENT_AMBULATORY_CARE_PROVIDER_SITE_OTHER): Payer: BC Managed Care – PPO | Admitting: *Deleted

## 2018-09-16 ENCOUNTER — Other Ambulatory Visit: Payer: Self-pay

## 2018-09-16 DIAGNOSIS — E538 Deficiency of other specified B group vitamins: Secondary | ICD-10-CM | POA: Diagnosis not present

## 2018-09-16 MED ORDER — CYANOCOBALAMIN 1000 MCG/ML IJ SOLN
1000.0000 ug | Freq: Once | INTRAMUSCULAR | Status: AC
  Administered 2018-09-16: 1000 ug via INTRAMUSCULAR

## 2018-09-16 NOTE — Progress Notes (Signed)
Per orders of Dr. Hernandez, injection of B12 given by Aalyiah Camberos. Patient tolerated injection well.  

## 2018-10-14 ENCOUNTER — Ambulatory Visit (INDEPENDENT_AMBULATORY_CARE_PROVIDER_SITE_OTHER): Payer: BC Managed Care – PPO | Admitting: *Deleted

## 2018-10-14 ENCOUNTER — Other Ambulatory Visit: Payer: Self-pay

## 2018-10-14 DIAGNOSIS — E538 Deficiency of other specified B group vitamins: Secondary | ICD-10-CM | POA: Diagnosis not present

## 2018-10-14 MED ORDER — CYANOCOBALAMIN 1000 MCG/ML IJ SOLN
1000.0000 ug | Freq: Once | INTRAMUSCULAR | Status: AC
  Administered 2018-10-14: 11:00:00 1000 ug via INTRAMUSCULAR

## 2018-10-14 NOTE — Progress Notes (Signed)
Per orders of Dr. Hernandez, injection of Cyanocobalamin 1000mcg given by Raha Tennison A. Patient tolerated injection well. 

## 2018-10-20 ENCOUNTER — Encounter: Payer: Self-pay | Admitting: Internal Medicine

## 2018-10-21 ENCOUNTER — Other Ambulatory Visit: Payer: Self-pay | Admitting: Internal Medicine

## 2018-10-21 DIAGNOSIS — I10 Essential (primary) hypertension: Secondary | ICD-10-CM

## 2018-10-21 MED ORDER — HYDROCHLOROTHIAZIDE 25 MG PO TABS: 25.0000 mg | ORAL_TABLET | Freq: Every day | ORAL | 1 refills | Status: DC

## 2018-10-21 MED ORDER — LOSARTAN POTASSIUM 100 MG PO TABS: 100.0000 mg | ORAL_TABLET | Freq: Every day | ORAL | 1 refills | Status: DC

## 2018-11-18 ENCOUNTER — Other Ambulatory Visit: Payer: Self-pay

## 2018-11-18 ENCOUNTER — Ambulatory Visit (INDEPENDENT_AMBULATORY_CARE_PROVIDER_SITE_OTHER): Payer: BC Managed Care – PPO | Admitting: *Deleted

## 2018-11-18 DIAGNOSIS — E538 Deficiency of other specified B group vitamins: Secondary | ICD-10-CM | POA: Diagnosis not present

## 2018-11-18 MED ORDER — CYANOCOBALAMIN 1000 MCG/ML IJ SOLN: 1000.0000 ug | Freq: Once | INTRAMUSCULAR | 0 refills | Status: DC

## 2018-11-18 MED ORDER — CYANOCOBALAMIN 1000 MCG/ML IJ SOLN
1000.0000 ug | Freq: Once | INTRAMUSCULAR | Status: AC
  Administered 2018-11-18: 1000 ug via INTRAMUSCULAR

## 2018-11-18 NOTE — Progress Notes (Signed)
Patient in office today for B-12 injection. Injection administered with no reactions  

## 2018-11-21 ENCOUNTER — Encounter: Payer: Self-pay | Admitting: Internal Medicine

## 2018-12-10 ENCOUNTER — Other Ambulatory Visit: Payer: Self-pay | Admitting: Internal Medicine

## 2018-12-10 DIAGNOSIS — E538 Deficiency of other specified B group vitamins: Secondary | ICD-10-CM

## 2018-12-11 ENCOUNTER — Other Ambulatory Visit: Payer: Self-pay | Admitting: Internal Medicine

## 2018-12-15 ENCOUNTER — Other Ambulatory Visit: Payer: Self-pay

## 2018-12-16 ENCOUNTER — Ambulatory Visit (INDEPENDENT_AMBULATORY_CARE_PROVIDER_SITE_OTHER): Payer: BC Managed Care – PPO

## 2018-12-16 DIAGNOSIS — E538 Deficiency of other specified B group vitamins: Secondary | ICD-10-CM | POA: Diagnosis not present

## 2018-12-16 MED ORDER — CYANOCOBALAMIN 1000 MCG/ML IJ SOLN
1000.0000 ug | Freq: Once | INTRAMUSCULAR | Status: AC
  Administered 2018-12-16: 1000 ug via INTRAMUSCULAR

## 2018-12-16 NOTE — Progress Notes (Signed)
After obtaining consent, and per orders of Dr. Jerilee Hoh, injection of B12 given by Melburn Hake Cox. Patient instructed to remain in clinic for 20 minutes afterwards, and to report any adverse reaction to me immediately.

## 2019-01-20 ENCOUNTER — Ambulatory Visit (INDEPENDENT_AMBULATORY_CARE_PROVIDER_SITE_OTHER): Payer: BC Managed Care – PPO | Admitting: *Deleted

## 2019-01-20 ENCOUNTER — Other Ambulatory Visit: Payer: Self-pay

## 2019-01-20 DIAGNOSIS — E538 Deficiency of other specified B group vitamins: Secondary | ICD-10-CM | POA: Diagnosis not present

## 2019-01-20 MED ORDER — CYANOCOBALAMIN 1000 MCG/ML IJ SOLN
1000.0000 ug | Freq: Once | INTRAMUSCULAR | Status: AC
  Administered 2019-01-20: 1000 ug via INTRAMUSCULAR

## 2019-01-20 NOTE — Progress Notes (Signed)
Per orders of Dr. Hernandez, injection of B12 given by Aurelius Gildersleeve S Lux Skilton. Patient tolerated injection well. 

## 2019-01-27 ENCOUNTER — Encounter: Payer: Self-pay | Admitting: Internal Medicine

## 2019-02-17 ENCOUNTER — Ambulatory Visit (INDEPENDENT_AMBULATORY_CARE_PROVIDER_SITE_OTHER): Payer: BC Managed Care – PPO | Admitting: *Deleted

## 2019-02-17 ENCOUNTER — Other Ambulatory Visit: Payer: Self-pay

## 2019-02-17 DIAGNOSIS — E538 Deficiency of other specified B group vitamins: Secondary | ICD-10-CM

## 2019-02-17 MED ORDER — CYANOCOBALAMIN 1000 MCG/ML IJ SOLN
1000.0000 ug | Freq: Once | INTRAMUSCULAR | Status: AC
  Administered 2019-02-17: 16:00:00 1000 ug via INTRAMUSCULAR

## 2019-02-17 NOTE — Progress Notes (Signed)
Per orders of Dr. Burchette, injection of B12 given by Reigan Tolliver. Patient tolerated injection well. 

## 2019-02-19 ENCOUNTER — Ambulatory Visit: Payer: BC Managed Care – PPO | Attending: Internal Medicine

## 2019-02-19 DIAGNOSIS — Z23 Encounter for immunization: Secondary | ICD-10-CM | POA: Insufficient documentation

## 2019-02-19 NOTE — Progress Notes (Signed)
   Covid-19 Vaccination Clinic  Name:  Joel Payne    MRN: [id number] DOB: [birth year]  02/19/2019  [name] was observed post Covid-19 immunization for 15 minutes without incidence. He was provided with Vaccine Information Sheet and instruction to access the V-Safe system.   [name] was instructed to call 911 with any severe reactions post vaccine: Marland Kitchen Difficulty breathing  . Swelling of your face and throat  . A fast heartbeat  . A bad rash all over your body  . Dizziness and weakness    Immunizations Administered    Name Date Dose VIS Date Route   Pfizer COVID-19 Vaccine 02/19/2019  2:28 PM 0.3 mL 12/23/2018 Intramuscular   Manufacturer: [employer], [employer]   Lot: [id number]   NDC: [address]-5

## 2019-03-06 ENCOUNTER — Ambulatory Visit: Payer: BC Managed Care – PPO

## 2019-03-16 ENCOUNTER — Ambulatory Visit: Payer: BC Managed Care – PPO | Attending: Internal Medicine

## 2019-03-16 ENCOUNTER — Other Ambulatory Visit: Payer: Self-pay

## 2019-03-16 DIAGNOSIS — Z23 Encounter for immunization: Secondary | ICD-10-CM | POA: Insufficient documentation

## 2019-03-16 NOTE — Progress Notes (Signed)
   Covid-19 Vaccination Clinic  Name:  READ BONELLI    MRN: [id number] DOB: 06-27-1945  03/16/2019  Mr. Griggs was observed post Covid-19 immunization for 15 minutes without incident. He was provided with Vaccine Information Sheet and instruction to access the V-Safe system.   Mr. Dahan was instructed to call 911 with any severe reactions post vaccine: Marland Kitchen Difficulty breathing  . Swelling of face and throat  . A fast heartbeat  . A bad rash all over body  . Dizziness and weakness   Immunizations Administered    Name Date Dose VIS Date Route   Pfizer COVID-19 Vaccine 03/16/2019  9:59 AM 0.3 mL 12/23/2018 Intramuscular   Manufacturer: [employer], [employer]   Lot: [id number]   NDC: [address]-9

## 2019-03-17 ENCOUNTER — Ambulatory Visit (INDEPENDENT_AMBULATORY_CARE_PROVIDER_SITE_OTHER): Payer: BC Managed Care – PPO | Admitting: *Deleted

## 2019-03-17 DIAGNOSIS — E538 Deficiency of other specified B group vitamins: Secondary | ICD-10-CM | POA: Diagnosis not present

## 2019-03-17 MED ORDER — CYANOCOBALAMIN 1000 MCG/ML IJ SOLN
1000.0000 ug | Freq: Once | INTRAMUSCULAR | Status: AC
  Administered 2019-03-17: 1000 ug via INTRAMUSCULAR

## 2019-03-17 NOTE — Progress Notes (Signed)
Per orders of Dr. Hernandez Acosta, injection of B 12 given by Leomar Westberg S Beverlyn Mcginness. Patient tolerated injection well. 

## 2019-03-23 ENCOUNTER — Other Ambulatory Visit (HOSPITAL_COMMUNITY): Payer: Self-pay | Admitting: Internal Medicine

## 2019-04-06 ENCOUNTER — Encounter: Payer: Self-pay | Admitting: Internal Medicine

## 2019-04-07 MED ORDER — CYANOCOBALAMIN 1000 MCG/ML IJ SOLN: INTRAMUSCULAR | 1 refills | Status: DC

## 2019-04-07 MED ORDER — "BD SAFETYGLIDE SYRINGE/NEEDLE 25G X 1"" 3 ML MISC": 11 refills | Status: DC

## 2019-04-11 ENCOUNTER — Other Ambulatory Visit: Payer: Self-pay | Admitting: Internal Medicine

## 2019-04-11 DIAGNOSIS — I10 Essential (primary) hypertension: Secondary | ICD-10-CM

## 2019-04-12 ENCOUNTER — Other Ambulatory Visit: Payer: Self-pay

## 2019-04-12 ENCOUNTER — Other Ambulatory Visit: Payer: Self-pay | Admitting: Internal Medicine

## 2019-04-13 ENCOUNTER — Ambulatory Visit (INDEPENDENT_AMBULATORY_CARE_PROVIDER_SITE_OTHER): Payer: BC Managed Care – PPO

## 2019-04-13 VITALS — Temp 98.1°F | Ht 67.0 in | Wt 203.0 lb

## 2019-04-13 DIAGNOSIS — E538 Deficiency of other specified B group vitamins: Secondary | ICD-10-CM | POA: Diagnosis not present

## 2019-04-13 MED ORDER — CYANOCOBALAMIN 1000 MCG/ML IJ SOLN
1000.0000 ug | Freq: Once | INTRAMUSCULAR | Status: AC
  Administered 2019-04-13: 09:00:00 1000 ug via INTRAMUSCULAR

## 2019-04-13 NOTE — Progress Notes (Signed)
Per orders of Dr. Philip Aspen, injection of B 12  given in right thigh by Alison Murray.  Patient tolerated injection well. Patient is going to start giving injections himself and patient has been advised on how to give injections and wants to give injection himself at his next visit with guidance.

## 2019-04-14 ENCOUNTER — Ambulatory Visit: Payer: BC Managed Care – PPO

## 2019-04-17 ENCOUNTER — Encounter: Payer: Self-pay | Admitting: Internal Medicine

## 2019-04-20 ENCOUNTER — Other Ambulatory Visit: Payer: Self-pay

## 2019-04-21 ENCOUNTER — Ambulatory Visit (INDEPENDENT_AMBULATORY_CARE_PROVIDER_SITE_OTHER): Payer: BC Managed Care – PPO | Admitting: Internal Medicine

## 2019-04-21 VITALS — BP 130/80 | HR 87 | Temp 98.7°F | Wt 202.3 lb

## 2019-04-21 DIAGNOSIS — M25511 Pain in right shoulder: Secondary | ICD-10-CM

## 2019-04-21 DIAGNOSIS — W19XXXA Unspecified fall, initial encounter: Secondary | ICD-10-CM | POA: Diagnosis not present

## 2019-04-21 DIAGNOSIS — M542 Cervicalgia: Secondary | ICD-10-CM

## 2019-04-21 DIAGNOSIS — G8929 Other chronic pain: Secondary | ICD-10-CM

## 2019-04-21 DIAGNOSIS — R519 Headache, unspecified: Secondary | ICD-10-CM

## 2019-04-21 NOTE — Patient Instructions (Signed)
-  Nice seeing you today!!  -We have ordered a CT of your head. We will notify you of where and when to go.  -For your neck and shoulder: icing for 15 mins twice a day, as needed ibuprofen, local massage therapy. Let me know if no improvement.

## 2019-04-21 NOTE — Progress Notes (Signed)
Acute Office Visit     This visit occurred during the SARS-CoV-2 public health emergency.  Safety protocols were in place, including screening questions prior to the visit, additional usage of staff PPE, and extensive cleaning of exam room while observing appropriate contact time as indicated for disinfecting solutions.    CC/Reason for Visit: Discuss some acute concerns  HPI: Joel Payne is a 74 y.o. male who is coming in today for the above mentioned reasons.  1. Slipped in ice in January. Ever since then "things have not been quite right". Has been having intermittent HA and feels like his balance is off. Of not, he has a fib and is on Eliquis.  2. Has been having right neck and shoulder pain for about 2 weeks after doing some heavy lifting.   Past Medical/Surgical History: Past Medical History:  Diagnosis Date  . ALLERGIC RHINITIS 11/08/2006  . BENIGN PROSTATIC HYPERTROPHY 10/12/2006  . CORONARY ARTERY DISEASE 10/12/2006   Catheterization was normal 2012, mild nonobstructive coronary disease, normal LV function  . HYPERLIPIDEMIA 11/08/2006  . HYPERTENSION 10/12/2006  . Kidney stone    in 1980  . 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 Shortness of breath    April, 2012  /  catheterization May, 2012 mild nonobstructive coronary disease  . Stroke (HCC)   . TIA (transient ischemic attack) 10/2015  . Urinary frequency 10/18/2009    Past Surgical History:  Procedure Laterality Date  . CARDIAC CATHETERIZATION  2012  . non radical prostatectomy  08/2011  . PROSTATE BIOPSY  2008  . TEE WITHOUT CARDIOVERSION N/A 10/31/2015   Procedure: TRANSESOPHAGEAL ECHOCARDIOGRAM (TEE) will alos have loop;  Surgeon: Wendall Stade, MD;  Location: [facility] ENDOSCOPY;  Service: Cardiovascular;  Laterality: N/A;  . TONSILLECTOMY      Social History:  reports that he has never smoked. He has never used smokeless tobacco. He reports that he does not drink alcohol or use  drugs.  Allergies: Allergies  Allergen Reactions  . Simvastatin Other (See Comments)    Leg cramps    Family History:  Family History  Problem Relation Age of Onset  . Colon cancer Neg Hx   . Esophageal cancer Neg Hx   . Rectal cancer Neg Hx   . Stomach cancer Neg Hx      Current Outpatient Medications:  .  cyanocobalamin (,VITAMIN B-12,) 1000 MCG/ML injection, Inject 63ml in deltoid once weekly for 4 weeks, then inject 1 ml once a month thereafter, Disp: 6 mL, Rfl: 1 .  ELIQUIS 5 MG TABS tablet, TAKE 1 TABLET BY MOUTH TWICE A DAY, Disp: 180 tablet, Rfl: 1 .  hydrochlorothiazide (HYDRODIURIL) 25 MG tablet, TAKE 1 TABLET BY MOUTH EVERY DAY, Disp: 90 tablet, Rfl: 1 .  losartan (COZAAR) 100 MG tablet, TAKE 1 TABLET BY MOUTH EVERY DAY, Disp: 90 tablet, Rfl: 1 .  metoprolol tartrate (LOPRESSOR) 25 MG tablet, TAKE 1 TABLET BY MOUTH TWICE A DAY, Disp: 180 tablet, Rfl: 1 .  pravastatin (PRAVACHOL) 20 MG tablet, TAKE 1 TABLET BY MOUTH EVERY DAY, Disp: 90 tablet, Rfl: 1 .  SYRINGE-NEEDLE, DISP, 3 ML (BD SAFETYGLIDE SYRINGE/NEEDLE) 25G X 1" 3 ML MISC, Use for B12 injections, Disp: 100 each, Rfl: 11  Review of Systems:  Constitutional: Denies fever, chills, diaphoresis, appetite change and fatigue.  HEENT: Denies photophobia, eye pain, redness, hearing loss, ear pain, congestion, sore throat, rhinorrhea, sneezing, mouth sores, trouble swallowing, neck pain, neck stiffness  and tinnitus.   Respiratory: Denies SOB, DOE, cough, chest tightness,  and wheezing.   Cardiovascular: Denies chest pain, palpitations and leg swelling.  Gastrointestinal: Denies nausea, vomiting, abdominal pain, diarrhea, constipation, blood in stool and abdominal distention.  Genitourinary: Denies dysuria, urgency, frequency, hematuria, flank pain and difficulty urinating.  Endocrine: Denies: hot or cold intolerance, sweats, changes in hair or nails, polyuria, polydipsia. Musculoskeletal: Denies myalgias, back pain, joint  swelling, arthralgias and gait problem.  Skin: Denies pallor, rash and wound.  Neurological: Denies dizziness, seizures, syncope, weakness, light-headedness, numbness. Hematological: Denies adenopathy. Easy bruising, personal or family bleeding history  Psychiatric/Behavioral: Denies suicidal ideation, mood changes, confusion, nervousness, sleep disturbance and agitation    Physical Exam: Vitals:   04/21/19 1450  BP: 130/80  Pulse: 87  Temp: 98.7 F (37.1 C)  TempSrc: Temporal  SpO2: 96%  Weight: 202 lb 4.8 oz (91.8 kg)    Body mass index is 31.68 kg/m.   Constitutional: NAD, calm, comfortable Eyes: PERRL, lids and conjunctivae normal, wears corrective lenses ENMT: Mucous membranes are moist.  Neurologic: grossly intact and non-focal. Psychiatric: Normal judgment and insight. Alert and oriented x 3. Normal mood.    Impression and Plan:  Fall, initial encounter Chronic nonintractable headache, unspecified headache type  -As he is on Eliquis will plan for CT Head STAT.  Neck pain on right side Chronic right shoulder pain -Advised icing, PRN NSAIDs, local massage therapy. -He will contact us if no improvement.    Patient Instructions  -Nice seeing you today!!  -We have ordered a CT of your head. We will notify you of where and when to go.  -For your neck and shoulder: icing for 15 mins twice a day, as needed ibuprofen, local massage therapy. Let me know if no improvement.     Lelon Frohlich, MD [facility]

## 2019-04-24 ENCOUNTER — Encounter: Payer: Self-pay | Admitting: Internal Medicine

## 2019-04-26 ENCOUNTER — Ambulatory Visit (INDEPENDENT_AMBULATORY_CARE_PROVIDER_SITE_OTHER)
Admission: RE | Admit: 2019-04-26 | Discharge: 2019-04-26 | Disposition: A | Discharge status: Home / Self Care | Payer: BC Managed Care – PPO | Source: Ambulatory Visit | Attending: Internal Medicine | Admitting: Internal Medicine

## 2019-04-26 ENCOUNTER — Other Ambulatory Visit: Payer: Self-pay

## 2019-04-26 DIAGNOSIS — G8929 Other chronic pain: Secondary | ICD-10-CM | POA: Diagnosis not present

## 2019-04-26 DIAGNOSIS — R519 Headache, unspecified: Secondary | ICD-10-CM

## 2019-05-18 ENCOUNTER — Other Ambulatory Visit: Payer: Self-pay

## 2019-05-19 ENCOUNTER — Ambulatory Visit (INDEPENDENT_AMBULATORY_CARE_PROVIDER_SITE_OTHER): Payer: BC Managed Care – PPO

## 2019-05-19 DIAGNOSIS — E538 Deficiency of other specified B group vitamins: Secondary | ICD-10-CM

## 2019-05-19 MED ORDER — CYANOCOBALAMIN 1000 MCG/ML IJ SOLN
1000.0000 ug | Freq: Once | INTRAMUSCULAR | Status: AC
  Administered 2019-05-19: 1000 ug via INTRAMUSCULAR

## 2019-05-19 NOTE — Patient Instructions (Signed)
There are no preventive care reminders to display for this patient.  Depression screen [facility] 2/9 08/23/2018 04/12/2017 11/25/2015  Decreased Interest 0 0 0  Down, Depressed, Hopeless 0 0 0  PHQ - 2 Score 0 0 0  Altered sleeping 0 - -  Tired, decreased energy 0 - -  Change in appetite 0 - -  Feeling bad or failure about yourself  0 - -  Trouble concentrating 0 - -  Moving slowly or fidgety/restless 0 - -  Suicidal thoughts 0 - -  PHQ-9 Score 0 - -  Difficult doing work/chores Not difficult at all - -

## 2019-05-19 NOTE — Progress Notes (Signed)
Per orders of Dr.Hernandez , injection of B12 given in Left thigh by patient. Patient present for self teaching. Pt tolerated injection well. Pt also verbalized information given. Pt also given print out for review. Pt stated B12 med and supplies were already sent to pharmacy pt received them yesterday.

## 2019-06-01 ENCOUNTER — Other Ambulatory Visit: Payer: Self-pay | Admitting: Internal Medicine

## 2019-06-16 ENCOUNTER — Ambulatory Visit: Payer: BC Managed Care – PPO

## 2019-07-14 ENCOUNTER — Ambulatory Visit: Payer: BC Managed Care – PPO

## 2019-08-18 ENCOUNTER — Ambulatory Visit: Payer: BC Managed Care – PPO

## 2019-08-26 ENCOUNTER — Other Ambulatory Visit: Payer: Self-pay | Admitting: Internal Medicine

## 2019-09-01 ENCOUNTER — Other Ambulatory Visit: Payer: Self-pay | Admitting: Internal Medicine

## 2019-09-04 ENCOUNTER — Other Ambulatory Visit (HOSPITAL_COMMUNITY): Payer: Self-pay | Admitting: Internal Medicine

## 2019-09-05 ENCOUNTER — Encounter: Payer: BC Managed Care – PPO | Admitting: Internal Medicine

## 2019-09-14 ENCOUNTER — Encounter: Payer: BC Managed Care – PPO | Admitting: Internal Medicine

## 2019-10-03 ENCOUNTER — Other Ambulatory Visit: Payer: Self-pay | Admitting: Internal Medicine

## 2019-10-03 DIAGNOSIS — I10 Essential (primary) hypertension: Secondary | ICD-10-CM

## 2019-10-04 ENCOUNTER — Other Ambulatory Visit: Payer: Self-pay

## 2019-10-04 ENCOUNTER — Ambulatory Visit (INDEPENDENT_AMBULATORY_CARE_PROVIDER_SITE_OTHER): Payer: BC Managed Care – PPO | Admitting: Internal Medicine

## 2019-10-04 ENCOUNTER — Encounter: Payer: Self-pay | Admitting: Internal Medicine

## 2019-10-04 VITALS — BP 130/80 | HR 78 | Temp 98.1°F | Ht 67.0 in | Wt 204.9 lb

## 2019-10-04 DIAGNOSIS — E538 Deficiency of other specified B group vitamins: Secondary | ICD-10-CM | POA: Diagnosis not present

## 2019-10-04 DIAGNOSIS — Z7901 Long term (current) use of anticoagulants: Secondary | ICD-10-CM

## 2019-10-04 DIAGNOSIS — Z0000 Encounter for general adult medical examination without abnormal findings: Secondary | ICD-10-CM

## 2019-10-04 DIAGNOSIS — I480 Paroxysmal atrial fibrillation: Secondary | ICD-10-CM

## 2019-10-04 DIAGNOSIS — Z23 Encounter for immunization: Secondary | ICD-10-CM | POA: Diagnosis not present

## 2019-10-04 DIAGNOSIS — I10 Essential (primary) hypertension: Secondary | ICD-10-CM

## 2019-10-04 DIAGNOSIS — E785 Hyperlipidemia, unspecified: Secondary | ICD-10-CM

## 2019-10-04 NOTE — Progress Notes (Signed)
Established Patient Office Visit     This visit occurred during the SARS-CoV-2 public health emergency.  Safety protocols were in place, including screening questions prior to the visit, additional usage of staff PPE, and extensive cleaning of exam room while observing appropriate contact time as indicated for disinfecting solutions.    CC/Reason for Visit: Annual preventive exam  HPI: Joel Payne is a 74 y.o. male who is coming in today for the above mentioned reasons. Past Medical History is significant for: Hypertension, hyperlipidemia and paroxysmal atrial fibrillation anticoagulated on Eliquis. He has routine eye and dental care. Feels like his hearing is okay, he walks on a daily basis, he has had his Covid vaccines, needs his flu vaccine updated. He had a colonoscopy in 2014. He has no acute complaints today.   Past Medical/Surgical History: Past Medical History:  Diagnosis Date  . ALLERGIC RHINITIS 11/08/2006  . BENIGN PROSTATIC HYPERTROPHY 10/12/2006  . CORONARY ARTERY DISEASE 10/12/2006   Catheterization was normal 2012, mild nonobstructive coronary disease, normal LV function  . HYPERLIPIDEMIA 11/08/2006  . HYPERTENSION 10/12/2006  . Kidney stone    in 1980  . 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 Shortness of breath    April, 2012  /  catheterization May, 2012 mild nonobstructive coronary disease  . Stroke ([place])   . TIA (transient ischemic attack) 10/2015  . Urinary frequency 10/18/2009    Past Surgical History:  Procedure Laterality Date  . CARDIAC CATHETERIZATION  2012  . non radical prostatectomy  08/2011  . PROSTATE BIOPSY  2008  . TEE WITHOUT CARDIOVERSION N/A 10/31/2015   Procedure: TRANSESOPHAGEAL ECHOCARDIOGRAM (TEE) will alos have loop;  Surgeon: Josue Hector, MD;  Location: [facility] ENDOSCOPY;  Service: Cardiovascular;  Laterality: N/A;  . TONSILLECTOMY      Social History:  reports that he has never smoked. He has never used  smokeless tobacco. He reports that he does not drink alcohol and does not use drugs.  Allergies: Allergies  Allergen Reactions  . Simvastatin Other (See Comments)    Leg cramps    Family History:  Family History  Problem Relation Age of Onset  . Colon cancer Neg Hx   . Esophageal cancer Neg Hx   . Rectal cancer Neg Hx   . Stomach cancer Neg Hx      Current Outpatient Medications:  .  cyanocobalamin (,VITAMIN B-12,) 1000 MCG/ML injection, Inject 22m in deltoid once weekly for 4 weeks, then inject 1 ml once a month thereafter, Disp: 6 mL, Rfl: 1 .  ELIQUIS 5 MG TABS tablet, TAKE 1 TABLET BY MOUTH TWICE A DAY, Disp: 180 tablet, Rfl: 1 .  hydrochlorothiazide (HYDRODIURIL) 25 MG tablet, TAKE 1 TABLET BY MOUTH EVERY DAY, Disp: 90 tablet, Rfl: 0 .  losartan (COZAAR) 100 MG tablet, TAKE 1 TABLET BY MOUTH EVERY DAY, Disp: 90 tablet, Rfl: 0 .  losartan-hydrochlorothiazide (HYZAAR) 100-12.5 MG tablet, TAKE 1 TABLET BY MOUTH EVERY DAY, Disp: 90 tablet, Rfl: 1 .  metoprolol tartrate (LOPRESSOR) 25 MG tablet, TAKE 1 TABLET BY MOUTH TWICE A DAY, Disp: 180 tablet, Rfl: 1 .  pravastatin (PRAVACHOL) 20 MG tablet, TAKE 1 TABLET BY MOUTH EVERY DAY, Disp: 90 tablet, Rfl: 0 .  SYRINGE-NEEDLE, DISP, 3 ML (BD SAFETYGLIDE SYRINGE/NEEDLE) 25G X 1" 3 ML MISC, Use for B12 injections, Disp: 100 each, Rfl: 11  Review of Systems:  Constitutional: Denies fever, chills, diaphoresis, appetite change and fatigue.  HEENT: Denies  photophobia, eye pain, redness, hearing loss, ear pain, congestion, sore throat, rhinorrhea, sneezing, mouth sores, trouble swallowing, neck pain, neck stiffness and tinnitus.   Respiratory: Denies SOB, DOE, cough, chest tightness,  and wheezing.   Cardiovascular: Denies chest pain, palpitations and leg swelling.  Gastrointestinal: Denies nausea, vomiting, abdominal pain, diarrhea, constipation, blood in stool and abdominal distention.  Genitourinary: Denies dysuria, urgency, frequency,  hematuria, flank pain and difficulty urinating.  Endocrine: Denies: hot or cold intolerance, sweats, changes in hair or nails, polyuria, polydipsia. Musculoskeletal: Denies myalgias, back pain, joint swelling, arthralgias and gait problem.  Skin: Denies pallor, rash and wound.  Neurological: Denies dizziness, seizures, syncope, weakness, light-headedness, numbness and headaches.  Hematological: Denies adenopathy. Easy bruising, personal or family bleeding history  Psychiatric/Behavioral: Denies suicidal ideation, mood changes, confusion, nervousness, sleep disturbance and agitation    Physical Exam: Vitals:   10/04/19 1430  BP: 130/80  Pulse: 78  Temp: 98.1 F (36.7 C)  TempSrc: Oral  SpO2: 96%  Weight: 204 lb 14.4 oz (92.9 kg)  Height: 5' 7"  (1.702 m)    Body mass index is 32.09 kg/m.   Constitutional: NAD, calm, comfortable Eyes: PERRL, lids and conjunctivae normal, wears corrective lenses ENMT: Mucous membranes are moist. Tympanic membrane is pearly white, no erythema or bulging. Neck: normal, supple, no masses, no thyromegaly Respiratory: clear to auscultation bilaterally, no wheezing, no crackles. Normal respiratory effort. No accessory muscle use.  Cardiovascular: Regular rate and rhythm, no murmurs / rubs / gallops. No extremity edema. 2+ pedal pulses. No carotid bruits.  Abdomen: no tenderness, no masses palpated. No hepatosplenomegaly. Bowel sounds positive.  Musculoskeletal: no clubbing / cyanosis. No joint deformity upper and lower extremities. Good ROM, no contractures. Normal muscle tone.  Skin: no rashes, lesions, ulcers. No induration Neurologic: CN 2-12 grossly intact. Sensation intact, DTR normal. Strength 5/5 in all 4.  Psychiatric: Normal judgment and insight. Alert and oriented x 3. Normal mood.    Impression and Plan:  Encounter for preventive health examination -He has routine eye and dental care. -Flu vaccine today, otherwise immunizations are  up-to-date including Covid. -Screening labs today. -Healthy lifestyle discussed in detail. -He had a colonoscopy in 2014 and is a 10-year callback. -PSA for prostate cancer screening.  Vitamin B12 deficiency  - Plan: Vitamin B12  Paroxysmal atrial fibrillation (HCC) Chronic anticoagulation -Rate controlled on metoprolol, anticoagulated on Eliquis.  Dyslipidemia  - Plan: Lipid panel -Last LDL was 113 in August 2020, he is on pravastatin.  Essential hypertension  -Well-controlled on metoprolol and losartan/hydrochlorthiazide.  Need for influenza vaccination  - Plan: Flu Vaccine QUAD High Dose(Fluad)    Patient Instructions  -Nice seeing you today!!  -Return fasting for lab work.  -Flu vaccine today.  -Schedule follow up in 6 months.   Preventive Care 39 Years and Older, Male Preventive care refers to lifestyle choices and visits with your health care provider that can promote health and wellness. This includes:  A yearly physical exam. This is also called an annual well check.  Regular dental and eye exams.  Immunizations.  Screening for certain conditions.  Healthy lifestyle choices, such as diet and exercise. What can I expect for my preventive care visit? Physical exam Your health care provider will check:  Height and weight. These may be used to calculate body mass index (BMI), which is a measurement that tells if you are at a healthy weight.  Heart rate and blood pressure.  Your skin for abnormal spots. Counseling Your health care  provider may ask you questions about:  Alcohol, tobacco, and drug use.  Emotional well-being.  Home and relationship well-being.  Sexual activity.  Eating habits.  History of falls.  Memory and ability to understand (cognition).  Work and work Statistician. What immunizations do I need?  Influenza (flu) vaccine  This is recommended every year. Tetanus, diphtheria, and pertussis (Tdap) vaccine  You may need a  Td booster every 10 years. Varicella (chickenpox) vaccine  You may need this vaccine if you have not already been vaccinated. Zoster (shingles) vaccine  You may need this after age 44. Pneumococcal conjugate (PCV13) vaccine  One dose is recommended after age 66. Pneumococcal polysaccharide (PPSV23) vaccine  One dose is recommended after age 26. Measles, mumps, and rubella (MMR) vaccine  You may need at least one dose of MMR if you were born in 1957 or later. You may also need a second dose. Meningococcal conjugate (MenACWY) vaccine  You may need this if you have certain conditions. Hepatitis A vaccine  You may need this if you have certain conditions or if you travel or work in places where you may be exposed to hepatitis A. Hepatitis B vaccine  You may need this if you have certain conditions or if you travel or work in places where you may be exposed to hepatitis B. Haemophilus influenzae type b (Hib) vaccine  You may need this if you have certain conditions. You may receive vaccines as individual doses or as more than one vaccine together in one shot (combination vaccines). Talk with your health care provider about the risks and benefits of combination vaccines. What tests do I need? Blood tests  Lipid and cholesterol levels. These may be checked every 5 years, or more frequently depending on your overall health.  Hepatitis C test.  Hepatitis B test. Screening  Lung cancer screening. You may have this screening every year starting at age 66 if you have a 30-pack-year history of smoking and currently smoke or have quit within the past 15 years.  Colorectal cancer screening. All adults should have this screening starting at age 18 and continuing until age 75. Your health care provider may recommend screening at age 52 if you are at increased risk. You will have tests every 1-10 years, depending on your results and the type of screening test.  Prostate cancer screening.  Recommendations will vary depending on your family history and other risks.  Diabetes screening. This is done by checking your blood sugar (glucose) after you have not eaten for a while (fasting). You may have this done every 1-3 years.  Abdominal aortic aneurysm (AAA) screening. You may need this if you are a current or former smoker.  Sexually transmitted disease (STD) testing. Follow these instructions at home: Eating and drinking  Eat a diet that includes fresh fruits and vegetables, whole grains, lean protein, and low-fat dairy products. Limit your intake of foods with high amounts of sugar, saturated fats, and salt.  Take vitamin and mineral supplements as recommended by your health care provider.  Do not drink alcohol if your health care provider tells you not to drink.  If you drink alcohol: ? Limit how much you have to 0-2 drinks a day. ? Be aware of how much alcohol is in your drink. In the [place] one drink equals one 12 oz bottle of beer (355 mL), one 5 oz glass of wine (148 mL), or one 1 oz glass of hard liquor (44 mL). Lifestyle  Take daily care  of your teeth and gums.  Stay active. Exercise for at least 30 minutes on 5 or more days each week.  Do not use any products that contain nicotine or tobacco, such as cigarettes, e-cigarettes, and chewing tobacco. If you need help quitting, ask your health care provider.  If you are sexually active, practice safe sex. Use a condom or other form of protection to prevent STIs (sexually transmitted infections).  Talk with your health care provider about taking a low-dose aspirin or statin. What's next?  Visit your health care provider once a year for a well check visit.  Ask your health care provider how often you should have your eyes and teeth checked.  Stay up to date on all vaccines. This information is not intended to replace advice given to you by your health care provider. Make sure you discuss any questions you have with  your health care provider. Document Revised: 12/23/2017 Document Reviewed: 12/23/2017 Elsevier Patient Education  2020 [place], MD [facility]

## 2019-10-04 NOTE — Patient Instructions (Signed)
-Nice seeing you today!!  -Return fasting for lab work.  -Flu vaccine today.  -Schedule follow up in 6 months.   Preventive Care 74 Years and Older, Male Preventive care refers to lifestyle choices and visits with your health care provider that can promote health and wellness. This includes:  A yearly physical exam. This is also called an annual well check.  Regular dental and eye exams.  Immunizations.  Screening for certain conditions.  Healthy lifestyle choices, such as diet and exercise. What can I expect for my preventive care visit? Physical exam Your health care provider will check:  Height and weight. These may be used to calculate body mass index (BMI), which is a measurement that tells if you are at a healthy weight.  Heart rate and blood pressure.  Your skin for abnormal spots. Counseling Your health care provider may ask you questions about:  Alcohol, tobacco, and drug use.  Emotional well-being.  Home and relationship well-being.  Sexual activity.  Eating habits.  History of falls.  Memory and ability to understand (cognition).  Work and work Statistician. What immunizations do I need?  Influenza (flu) vaccine  This is recommended every year. Tetanus, diphtheria, and pertussis (Tdap) vaccine  You may need a Td booster every 10 years. Varicella (chickenpox) vaccine  You may need this vaccine if you have not already been vaccinated. Zoster (shingles) vaccine  You may need this after age 58. Pneumococcal conjugate (PCV13) vaccine  One dose is recommended after age 37. Pneumococcal polysaccharide (PPSV23) vaccine  One dose is recommended after age 29. Measles, mumps, and rubella (MMR) vaccine  You may need at least one dose of MMR if you were born in 1957 or later. You may also need a second dose. Meningococcal conjugate (MenACWY) vaccine  You may need this if you have certain conditions. Hepatitis A vaccine  You may need this if you  have certain conditions or if you travel or work in places where you may be exposed to hepatitis A. Hepatitis B vaccine  You may need this if you have certain conditions or if you travel or work in places where you may be exposed to hepatitis B. Haemophilus influenzae type b (Hib) vaccine  You may need this if you have certain conditions. You may receive vaccines as individual doses or as more than one vaccine together in one shot (combination vaccines). Talk with your health care provider about the risks and benefits of combination vaccines. What tests do I need? Blood tests  Lipid and cholesterol levels. These may be checked every 5 years, or more frequently depending on your overall health.  Hepatitis C test.  Hepatitis B test. Screening  Lung cancer screening. You may have this screening every year starting at age 60 if you have a 30-pack-year history of smoking and currently smoke or have quit within the past 15 years.  Colorectal cancer screening. All adults should have this screening starting at age 18 and continuing until age 11. Your health care provider may recommend screening at age 23 if you are at increased risk. You will have tests every 1-10 years, depending on your results and the type of screening test.  Prostate cancer screening. Recommendations will vary depending on your family history and other risks.  Diabetes screening. This is done by checking your blood sugar (glucose) after you have not eaten for a while (fasting). You may have this done every 1-3 years.  Abdominal aortic aneurysm (AAA) screening. You may need this if  you are a current or former smoker.  Sexually transmitted disease (STD) testing. Follow these instructions at home: Eating and drinking  Eat a diet that includes fresh fruits and vegetables, whole grains, lean protein, and low-fat dairy products. Limit your intake of foods with high amounts of sugar, saturated fats, and salt.  Take vitamin and  mineral supplements as recommended by your health care provider.  Do not drink alcohol if your health care provider tells you not to drink.  If you drink alcohol: ? Limit how much you have to 0-2 drinks a day. ? Be aware of how much alcohol is in your drink. In the [place] one drink equals one 12 oz bottle of beer (355 mL), one 5 oz glass of wine (148 mL), or one 1 oz glass of hard liquor (44 mL). Lifestyle  Take daily care of your teeth and gums.  Stay active. Exercise for at least 30 minutes on 5 or more days each week.  Do not use any products that contain nicotine or tobacco, such as cigarettes, e-cigarettes, and chewing tobacco. If you need help quitting, ask your health care provider.  If you are sexually active, practice safe sex. Use a condom or other form of protection to prevent STIs (sexually transmitted infections).  Talk with your health care provider about taking a low-dose aspirin or statin. What's next?  Visit your health care provider once a year for a well check visit.  Ask your health care provider how often you should have your eyes and teeth checked.  Stay up to date on all vaccines. This information is not intended to replace advice given to you by your health care provider. Make sure you discuss any questions you have with your health care provider. Document Revised: 12/23/2017 Document Reviewed: 12/23/2017 Elsevier Patient Education  2020 [employer].

## 2019-10-05 ENCOUNTER — Other Ambulatory Visit: Payer: BC Managed Care – PPO

## 2019-10-05 DIAGNOSIS — I10 Essential (primary) hypertension: Secondary | ICD-10-CM

## 2019-10-05 DIAGNOSIS — E538 Deficiency of other specified B group vitamins: Secondary | ICD-10-CM

## 2019-10-05 DIAGNOSIS — I480 Paroxysmal atrial fibrillation: Secondary | ICD-10-CM

## 2019-10-05 DIAGNOSIS — Z0000 Encounter for general adult medical examination without abnormal findings: Secondary | ICD-10-CM

## 2019-10-05 DIAGNOSIS — E785 Hyperlipidemia, unspecified: Secondary | ICD-10-CM

## 2019-10-06 ENCOUNTER — Other Ambulatory Visit: Payer: Self-pay | Admitting: Internal Medicine

## 2019-10-06 ENCOUNTER — Encounter: Payer: Self-pay | Admitting: Internal Medicine

## 2019-10-06 DIAGNOSIS — E559 Vitamin D deficiency, unspecified: Secondary | ICD-10-CM

## 2019-10-06 LAB — CBC WITH DIFFERENTIAL/PLATELET
Absolute Monocytes: 610 cells/uL (ref 200–950)
Basophils Absolute: 40 cells/uL (ref 0–200)
Basophils Relative: 0.7 %
Eosinophils Absolute: 342 cells/uL (ref 15–500)
Eosinophils Relative: 6 %
HCT: 43.2 % (ref 38.5–50.0)
Hemoglobin: 14.8 g/dL (ref 13.2–17.1)
Lymphs Abs: 1060 cells/uL (ref 850–3900)
MCH: 30 pg (ref 27.0–33.0)
MCHC: 34.3 g/dL (ref 32.0–36.0)
MCV: 87.4 fL (ref 80.0–100.0)
MPV: 10.4 fL (ref 7.5–12.5)
Monocytes Relative: 10.7 %
Neutro Abs: 3648 cells/uL (ref 1500–7800)
Neutrophils Relative %: 64 %
Platelets: 190 10*3/uL (ref 140–400)
RBC: 4.94 10*6/uL (ref 4.20–5.80)
RDW: 12.7 % (ref 11.0–15.0)
Total Lymphocyte: 18.6 %
WBC: 5.7 10*3/uL (ref 3.8–10.8)

## 2019-10-06 LAB — LIPID PANEL
Cholesterol: 165 mg/dL (ref <200)
HDL: 32 mg/dL — ABNORMAL LOW (ref >=40)
LDL Cholesterol (Calc): 110 mg/dL (calc) — ABNORMAL HIGH
Non-HDL Cholesterol (Calc): 133 mg/dL (calc) — ABNORMAL HIGH (ref <130)
Total CHOL/HDL Ratio: 5.2 (calc) — ABNORMAL HIGH (ref <5.0)
Triglycerides: 118 mg/dL (ref <150)

## 2019-10-06 LAB — COMPREHENSIVE METABOLIC PANEL
AG Ratio: 1.8 (calc) (ref 1.0–2.5)
ALT: 23 U/L (ref 9–46)
AST: 21 U/L (ref 10–35)
Albumin: 4.3 g/dL (ref 3.6–5.1)
Alkaline phosphatase (APISO): 44 U/L (ref 35–144)
BUN: 21 mg/dL (ref 7–25)
CO2: 30 mmol/L (ref 20–32)
Calcium: 9 mg/dL (ref 8.6–10.3)
Chloride: 105 mmol/L (ref 98–110)
Creat: 0.79 mg/dL (ref 0.70–1.18)
Globulin: 2.4 g/dL (calc) (ref 1.9–3.7)
Glucose, Bld: 101 mg/dL — ABNORMAL HIGH (ref 65–99)
Potassium: 3.5 mmol/L (ref 3.5–5.3)
Sodium: 140 mmol/L (ref 135–146)
Total Bilirubin: 0.9 mg/dL (ref 0.2–1.2)
Total Protein: 6.7 g/dL (ref 6.1–8.1)

## 2019-10-06 LAB — TSH: TSH: 2.71 mIU/L (ref 0.40–4.50)

## 2019-10-06 LAB — PSA: PSA: 0.38 ng/mL (ref <=4.0)

## 2019-10-06 LAB — VITAMIN B12: Vitamin B-12: 387 pg/mL (ref 200–1100)

## 2019-10-06 LAB — VITAMIN D 25 HYDROXY (VIT D DEFICIENCY, FRACTURES): Vit D, 25-Hydroxy: 25 ng/mL — ABNORMAL LOW (ref 30–100)

## 2019-10-06 MED ORDER — VITAMIN D (ERGOCALCIFEROL) 1.25 MG (50000 UNIT) PO CAPS: 50000.0000 [IU] | ORAL_CAPSULE | ORAL | 0 refills | Status: AC

## 2019-10-11 ENCOUNTER — Other Ambulatory Visit: Payer: Self-pay | Admitting: Internal Medicine

## 2019-12-12 ENCOUNTER — Encounter: Payer: Self-pay | Admitting: Internal Medicine

## 2019-12-19 MED ORDER — RIVAROXABAN 20 MG PO TABS: 20.0000 mg | ORAL_TABLET | Freq: Every day | ORAL | 1 refills | Status: DC

## 2019-12-25 ENCOUNTER — Other Ambulatory Visit: Payer: Self-pay | Admitting: Internal Medicine

## 2019-12-25 DIAGNOSIS — I10 Essential (primary) hypertension: Secondary | ICD-10-CM

## 2019-12-26 ENCOUNTER — Other Ambulatory Visit: Payer: Self-pay | Admitting: Internal Medicine

## 2019-12-26 DIAGNOSIS — E559 Vitamin D deficiency, unspecified: Secondary | ICD-10-CM

## 2020-02-25 ENCOUNTER — Other Ambulatory Visit: Payer: Self-pay | Admitting: Internal Medicine

## 2020-03-03 ENCOUNTER — Other Ambulatory Visit: Payer: Self-pay | Admitting: Internal Medicine

## 2020-04-10 ENCOUNTER — Other Ambulatory Visit: Payer: Self-pay | Admitting: Internal Medicine

## 2020-04-12 ENCOUNTER — Encounter: Payer: Self-pay | Admitting: Internal Medicine

## 2020-04-12 MED ORDER — "BD SAFETYGLIDE SYRINGE/NEEDLE 25G X 1"" 3 ML MISC": 11 refills | Status: DC

## 2020-06-13 ENCOUNTER — Other Ambulatory Visit: Payer: Self-pay | Admitting: Internal Medicine

## 2020-06-13 DIAGNOSIS — I10 Essential (primary) hypertension: Secondary | ICD-10-CM

## 2020-08-21 ENCOUNTER — Other Ambulatory Visit: Payer: Self-pay

## 2020-08-22 ENCOUNTER — Ambulatory Visit: Payer: BC Managed Care – PPO | Admitting: Internal Medicine

## 2020-08-22 ENCOUNTER — Encounter: Payer: Self-pay | Admitting: Internal Medicine

## 2020-08-22 VITALS — BP 150/80 | HR 64 | Temp 98.0°F | Wt 205.4 lb

## 2020-08-22 DIAGNOSIS — M79641 Pain in right hand: Secondary | ICD-10-CM | POA: Diagnosis not present

## 2020-08-22 NOTE — Progress Notes (Signed)
Acute office Visit     This visit occurred during the SARS-CoV-2 public health emergency.  Safety protocols were in place, including screening questions prior to the visit, additional usage of staff PPE, and extensive cleaning of exam room while observing appropriate contact time as indicated for disinfecting solutions.    CC/Reason for Visit: Right fourth finger pain  HPI: Joel Payne is a 75 y.o. male who is coming in today for the above mentioned reasons.  He states that while in [place] last week he went to lift something heavy and felt immediate pain of his right fourth finger that traveled up the palmar surface of the hand all the way up his forearm.  He feels that at times he has difficulty making a fist with that hand and grasping onto objects.  He has been taking 2 tablets of ibuprofen daily.  Over the course of the past week this has significantly improved.  There are certain movements such as twisting a doorknob which do cause some pain.  He is adamant that pain started at the finger level and traveled upwards, not the other way.  Past Medical/Surgical History: Past Medical History:  Diagnosis Date   ALLERGIC RHINITIS 11/08/2006   BENIGN PROSTATIC HYPERTROPHY 10/12/2006   CORONARY ARTERY DISEASE 10/12/2006   Catheterization was normal 2012, mild nonobstructive coronary disease, normal LV function   HYPERLIPIDEMIA 11/08/2006   HYPERTENSION 10/12/2006   Kidney stone    in 1980   NEPHROLITHIASIS, HX OF 10/12/2006   PPD positive    Shortness of breath    April, 2012  /  catheterization May, 2012 mild nonobstructive coronary disease   Stroke [facility])    TIA (transient ischemic attack) 10/2015   Urinary frequency 10/18/2009    Past Surgical History:  Procedure Laterality Date   CARDIAC CATHETERIZATION  2012   non radical prostatectomy  08/2011   PROSTATE BIOPSY  2008   TEE WITHOUT CARDIOVERSION N/A 10/31/2015   Procedure: TRANSESOPHAGEAL ECHOCARDIOGRAM (TEE) will alos have  loop;  Surgeon: Wendall Stade, MD;  Location: [facility] ENDOSCOPY;  Service: Cardiovascular;  Laterality: N/A;   TONSILLECTOMY      Social History:  reports that he has never smoked. He has never used smokeless tobacco. He reports that he does not drink alcohol and does not use drugs.  Allergies: Allergies  Allergen Reactions   Simvastatin Other (See Comments)    Leg cramps    Family History:  Family History  Problem Relation Age of Onset   Colon cancer Neg Hx    Esophageal cancer Neg Hx    Rectal cancer Neg Hx    Stomach cancer Neg Hx      Current Outpatient Medications:    cyanocobalamin (,VITAMIN B-12,) 1000 MCG/ML injection, INJECT IN DELTOID ONCE WEEKLY FOR 4 WEEKS, THEN INJECT 1 ML ONCE A MONTH THEREAFTER, Disp: 6 mL, Rfl: 1   hydrochlorothiazide (HYDRODIURIL) 25 MG tablet, TAKE 1 TABLET BY MOUTH EVERY DAY, Disp: 90 tablet, Rfl: 1   losartan (COZAAR) 100 MG tablet, TAKE 1 TABLET BY MOUTH EVERY DAY, Disp: 90 tablet, Rfl: 1   losartan-hydrochlorothiazide (HYZAAR) 100-12.5 MG tablet, TAKE 1 TABLET BY MOUTH EVERY DAY, Disp: 90 tablet, Rfl: 1   metoprolol tartrate (LOPRESSOR) 25 MG tablet, TAKE 1 TABLET BY MOUTH TWICE A DAY, Disp: 180 tablet, Rfl: 1   pravastatin (PRAVACHOL) 20 MG tablet, TAKE 1 TABLET BY MOUTH EVERY DAY, Disp: 90 tablet, Rfl: 1   SYRINGE-NEEDLE, DISP, 3 ML (  BD SAFETYGLIDE SYRINGE/NEEDLE) 25G X 1" 3 ML MISC, Use for B12 injections, Disp: 100 each, Rfl: 11   XARELTO 20 MG TABS tablet, TAKE 1 TABLET BY MOUTH DAILY WITH SUPPER., Disp: 90 tablet, Rfl: 1  Review of Systems:  Constitutional: Denies fever, chills, diaphoresis, appetite change and fatigue.  HEENT: Denies photophobia, eye pain, redness, hearing loss, ear pain, congestion, sore throat, rhinorrhea, sneezing, mouth sores, trouble swallowing, neck pain, neck stiffness and tinnitus.   Respiratory: Denies SOB, DOE, cough, chest tightness,  and wheezing.   Cardiovascular: Denies chest pain, palpitations and leg  swelling.  Gastrointestinal: Denies nausea, vomiting, abdominal pain, diarrhea, constipation, blood in stool and abdominal distention.  Genitourinary: Denies dysuria, urgency, frequency, hematuria, flank pain and difficulty urinating.  Endocrine: Denies: hot or cold intolerance, sweats, changes in hair or nails, polyuria, polydipsia. Musculoskeletal: Denies myalgias, back pain, joint swelling, arthralgias and gait problem.  Skin: Denies pallor, rash and wound.  Neurological: Denies dizziness, seizures, syncope, weakness, light-headedness, numbness and headaches.  Hematological: Denies adenopathy. Easy bruising, personal or family bleeding history  Psychiatric/Behavioral: Denies suicidal ideation, mood changes, confusion, nervousness, sleep disturbance and agitation    Physical Exam: Vitals:   08/22/20 1046  BP: (!) 150/80  Pulse: 64  Temp: 98 F (36.7 C)  TempSrc: Oral  SpO2: 96%  Weight: 205 lb 6.4 oz (93.2 kg)    Body mass index is 32.17 kg/m.   Constitutional: NAD, calm, comfortable Eyes: PERRL, lids and conjunctivae normal, wears corrective lenses ENMT: Mucous membranes are moist.  Musculoskeletal: no clubbing / cyanosis. No joint deformity upper and lower extremities. Good ROM, no contractures. Normal muscle tone.  Specifically there is no bruising, deformity or edema of the right fourth finger, hand or forearm. Neurologic: Grossly intact and nonfocal Psychiatric: Normal judgment and insight. Alert and oriented x 3. Normal mood.    Impression and Plan:  Pain of right hand -Specifically of the right fourth finger. -I wonder about some kind of tendinopathy, however given this is improving with time and NSAIDs I feel like we should give it another 2 to 3 weeks with conservative management, if no improvement by then can consider sports medicine versus orthopedics referral.    Chaya Jan, MD [facility] at [facility]

## 2020-09-22 ENCOUNTER — Other Ambulatory Visit: Payer: Self-pay | Admitting: Internal Medicine

## 2020-09-22 DIAGNOSIS — I10 Essential (primary) hypertension: Secondary | ICD-10-CM

## 2020-10-15 ENCOUNTER — Other Ambulatory Visit: Payer: Self-pay

## 2020-10-16 ENCOUNTER — Ambulatory Visit (INDEPENDENT_AMBULATORY_CARE_PROVIDER_SITE_OTHER): Payer: BC Managed Care – PPO | Admitting: Internal Medicine

## 2020-10-16 ENCOUNTER — Encounter: Payer: Self-pay | Admitting: Internal Medicine

## 2020-10-16 VITALS — BP 180/90 | HR 73 | Temp 98.1°F | Ht 67.0 in | Wt 205.6 lb

## 2020-10-16 DIAGNOSIS — Z0000 Encounter for general adult medical examination without abnormal findings: Secondary | ICD-10-CM

## 2020-10-16 DIAGNOSIS — E559 Vitamin D deficiency, unspecified: Secondary | ICD-10-CM | POA: Diagnosis not present

## 2020-10-16 DIAGNOSIS — I480 Paroxysmal atrial fibrillation: Secondary | ICD-10-CM | POA: Diagnosis not present

## 2020-10-16 DIAGNOSIS — R972 Elevated prostate specific antigen [PSA]: Secondary | ICD-10-CM | POA: Diagnosis not present

## 2020-10-16 DIAGNOSIS — E538 Deficiency of other specified B group vitamins: Secondary | ICD-10-CM

## 2020-10-16 DIAGNOSIS — I10 Essential (primary) hypertension: Secondary | ICD-10-CM

## 2020-10-16 DIAGNOSIS — E785 Hyperlipidemia, unspecified: Secondary | ICD-10-CM | POA: Diagnosis not present

## 2020-10-16 MED ORDER — HYDROCHLOROTHIAZIDE 25 MG PO TABS: 25.0000 mg | ORAL_TABLET | Freq: Every day | ORAL | 1 refills | Status: DC

## 2020-10-16 MED ORDER — LOSARTAN POTASSIUM 100 MG PO TABS: 100.0000 mg | ORAL_TABLET | Freq: Every day | ORAL | 1 refills | Status: DC

## 2020-10-16 NOTE — Progress Notes (Signed)
Established Patient Office Visit     This visit occurred during the SARS-CoV-2 public health emergency.  Safety protocols were in place, including screening questions prior to the visit, additional usage of staff PPE, and extensive cleaning of exam room while observing appropriate contact time as indicated for disinfecting solutions.    CC/Reason for Visit: Annual preventive exam  HPI: Joel Payne is a 75 y.o. male who is coming in today for the above mentioned reasons. Past Medical History is significant for: Hypertension, hyperlipidemia, atrial fibrillation anticoagulated on Xarelto.  He has been doing well and has no acute concerns.  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  He has routine eye and dental colonoscopy in 2014 all his vaccinations are up-to-date.   Past Medical/Surgical History: Past Medical History:  Diagnosis Date   ALLERGIC RHINITIS 11/08/2006   BENIGN PROSTATIC HYPERTROPHY 10/12/2006   CORONARY ARTERY DISEASE 10/12/2006   Catheterization was normal 2012, mild nonobstructive coronary disease, normal LV function   HYPERLIPIDEMIA 11/08/2006   HYPERTENSION 10/12/2006   Kidney stone    in 1980   NEPHROLITHIASIS, HX OF 10/12/2006   PPD positive    Shortness of breath    April, 2012  /  catheterization May, 2012 mild nonobstructive coronary disease   Stroke [facility])    TIA (transient ischemic attack) 10/2015   Urinary frequency 10/18/2009    Past Surgical History:  Procedure Laterality Date   CARDIAC CATHETERIZATION  2012   non radical prostatectomy  08/2011   PROSTATE BIOPSY  2008   TEE WITHOUT CARDIOVERSION N/A 10/31/2015   Procedure: TRANSESOPHAGEAL ECHOCARDIOGRAM (TEE) will alos have loop;  Surgeon: Wendall Stade, MD;  Location: [facility] ENDOSCOPY;  Service: Cardiovascular;  Laterality: N/A;   TONSILLECTOMY      Social History:  reports that he has never smoked. He has never used smokeless tobacco. He reports that he does not drink  alcohol and does not use drugs.  Allergies: Allergies  Allergen Reactions   Simvastatin Other (See Comments)    Leg cramps    Family History:  Family History  Problem Relation Age of Onset   Colon cancer Neg Hx    Esophageal cancer Neg Hx    Rectal cancer Neg Hx    Stomach cancer Neg Hx      Current Outpatient Medications:    cyanocobalamin (,VITAMIN B-12,) 1000 MCG/ML injection, INJECT IN DELTOID ONCE WEEKLY FOR 4 WEEKS, THEN INJECT 1 ML ONCE A MONTH THEREAFTER, Disp: 10 mL, Rfl: 1   metoprolol tartrate (LOPRESSOR) 25 MG tablet, TAKE 1 TABLET BY MOUTH TWICE A DAY, Disp: 180 tablet, Rfl: 1   pravastatin (PRAVACHOL) 20 MG tablet, TAKE 1 TABLET BY MOUTH EVERY DAY, Disp: 90 tablet, Rfl: 1   SYRINGE-NEEDLE, DISP, 3 ML (BD SAFETYGLIDE SYRINGE/NEEDLE) 25G X 1" 3 ML MISC, Use for B12 injections, Disp: 100 each, Rfl: 11   XARELTO 20 MG TABS tablet, TAKE 1 TABLET BY MOUTH DAILY WITH SUPPER., Disp: 90 tablet, Rfl: 1   hydrochlorothiazide (HYDRODIURIL) 25 MG tablet, Take 1 tablet (25 mg total) by mouth daily., Disp: 90 tablet, Rfl: 1   losartan (COZAAR) 100 MG tablet, Take 1 tablet (100 mg total) by mouth daily., Disp: 90 tablet, Rfl: 1  Review of Systems:  Constitutional: Denies fever, chills, diaphoresis, appetite change and fatigue.  HEENT: Denies photophobia, eye pain, redness, hearing loss, ear pain, congestion, sore throat, rhinorrhea, sneezing, mouth sores, trouble swallowing, neck pain, neck  stiffness and tinnitus.   Respiratory: Denies SOB, DOE, cough, chest tightness,  and wheezing.   Cardiovascular: Denies chest pain, palpitations and leg swelling.  Gastrointestinal: Denies nausea, vomiting, abdominal pain, diarrhea, constipation, blood in stool and abdominal distention.  Genitourinary: Denies dysuria, urgency, frequency, hematuria, flank pain and difficulty urinating.  Endocrine: Denies: hot or cold intolerance, sweats, changes in hair or nails, polyuria,  polydipsia. Musculoskeletal: Denies myalgias, back pain, joint swelling, arthralgias and gait problem.  Skin: Denies pallor, rash and wound.  Neurological: Denies dizziness, seizures, syncope, weakness, light-headedness, numbness and headaches.  Hematological: Denies adenopathy. Easy bruising, personal or family bleeding history  Psychiatric/Behavioral: Denies suicidal ideation, mood changes, confusion, nervousness, sleep disturbance and agitation    Physical Exam: Vitals:   10/16/20 1438  BP: (!) 180/90  Pulse: 73  Temp: 98.1 F (36.7 C)  TempSrc: Oral  SpO2: 97%  Weight: 205 lb 9.6 oz (93.3 kg)  Height: 5\' 7"  (1.702 m)    Body mass index is 32.2 kg/m.   Constitutional: NAD, calm, comfortable Eyes: PERRL, lids and conjunctivae normal, wears corrective lenses ENMT: Mucous membranes are moist. Posterior pharynx clear of any exudate or lesions. Normal dentition. Tympanic membrane is pearly white, no erythema or bulging. Neck: normal, supple, no masses, no thyromegaly Respiratory: clear to auscultation bilaterally, no wheezing, no crackles. Normal respiratory effort. No accessory muscle use.  Cardiovascular: Regular rate and rhythm, no murmurs / rubs / gallops. No extremity edema. 2+ pedal pulses. No carotid bruits.  Abdomen: no tenderness, no masses palpated. No hepatosplenomegaly. Bowel sounds positive.  Musculoskeletal: no clubbing / cyanosis. No joint deformity upper and lower extremities. Good ROM, no contractures. Normal muscle tone.  Skin: no rashes, lesions, ulcers. No induration Neurologic: CN 2-12 grossly intact. Sensation intact, DTR normal. Strength 5/5 in all 4.  Psychiatric: Normal judgment and insight. Alert and oriented x 3. Normal mood.    Impression and Plan:  Encounter for preventive health examination -He has routine eye and dental care. -All immunizations are up-to-date. -Labs will be updated today. -Healthy lifestyle discussed in detail. -PSA  today. -He had a colonoscopy in 2014 and is a 10-year callback.  Essential hypertension  -Blood pressure remains uncontrolled. -He is currently on losartan 100 mg, hydrochlorothiazide 25 months, metoprolol 25 mg twice daily. -He will do ambulatory blood pressure monitoring and contact me in 2 weeks for follow-up.  Dyslipidemia  - Plan: Lipid panel -Last lipid panel in September 2021 with a total cholesterol of 165, triglycerides 118, LDL of 110.  He is on pravastatin.  Paroxysmal atrial fibrillation (HCC) -Rate controlled, anticoagulated on Xarelto.  Vitamin D deficiency  - Plan: VITAMIN D 25 Hydroxy (Vit-D Deficiency, Fractures)  Vitamin B12 deficiency  - Plan: Vitamin B12  Elevated PSA  - Plan: PSA    Patient Instructions  -Nice seeing you today!!  -Lab work today; will notify you once results are available.  -Check BP daily and check in with me in 2 weeks with values.  -Schedule a follow up in 3 months.     October 2021, MD [facility] at [facility]

## 2020-10-16 NOTE — Addendum Note (Signed)
Addended by: Kandra Nicolas on: 10/16/2020 03:11 PM   Modules accepted: Orders

## 2020-10-16 NOTE — Patient Instructions (Signed)
-  Nice seeing you today!!  -Lab work today; will notify you once results are available.  -Check BP daily and check in with me in 2 weeks with values.  -Schedule a follow up in 3 months.

## 2020-10-17 ENCOUNTER — Encounter: Payer: Self-pay | Admitting: Internal Medicine

## 2020-10-17 ENCOUNTER — Other Ambulatory Visit: Payer: Self-pay | Admitting: Internal Medicine

## 2020-10-17 DIAGNOSIS — E559 Vitamin D deficiency, unspecified: Secondary | ICD-10-CM

## 2020-10-17 DIAGNOSIS — E876 Hypokalemia: Secondary | ICD-10-CM

## 2020-10-17 LAB — CBC WITH DIFFERENTIAL/PLATELET
Basophils Absolute: 0 10*3/uL (ref 0.0–0.1)
Basophils Relative: 0.5 % (ref 0.0–3.0)
Eosinophils Absolute: 0.2 10*3/uL (ref 0.0–0.7)
Eosinophils Relative: 4.6 % (ref 0.0–5.0)
HCT: 42.1 % (ref 39.0–52.0)
Hemoglobin: 14.9 g/dL (ref 13.0–17.0)
Lymphocytes Relative: 24.8 % (ref 12.0–46.0)
Lymphs Abs: 1.1 10*3/uL (ref 0.7–4.0)
MCHC: 35.3 g/dL (ref 30.0–36.0)
MCV: 84.8 fl (ref 78.0–100.0)
Monocytes Absolute: 0.7 10*3/uL (ref 0.1–1.0)
Monocytes Relative: 15.6 % — ABNORMAL HIGH (ref 3.0–12.0)
Neutro Abs: 2.4 10*3/uL (ref 1.4–7.7)
Neutrophils Relative %: 54.5 % (ref 43.0–77.0)
Platelets: 170 10*3/uL (ref 150.0–400.0)
RBC: 4.97 Mil/uL (ref 4.22–5.81)
RDW: 13.6 % (ref 11.5–15.5)
WBC: 4.4 10*3/uL (ref 4.0–10.5)

## 2020-10-17 LAB — COMPREHENSIVE METABOLIC PANEL
ALT: 20 U/L (ref 0–53)
AST: 21 U/L (ref 0–37)
Albumin: 4.3 g/dL (ref 3.5–5.2)
Alkaline Phosphatase: 55 U/L (ref 39–117)
BUN: 11 mg/dL (ref 6–23)
CO2: 34 mEq/L — ABNORMAL HIGH (ref 19–32)
Calcium: 9.2 mg/dL (ref 8.4–10.5)
Chloride: 98 mEq/L (ref 96–112)
Creatinine, Ser: 0.87 mg/dL (ref 0.40–1.50)
GFR: 84.38 mL/min (ref >60.00)
Glucose, Bld: 82 mg/dL (ref 70–99)
Potassium: 3.1 mEq/L — ABNORMAL LOW (ref 3.5–5.1)
Sodium: 139 mEq/L (ref 135–145)
Total Bilirubin: 0.9 mg/dL (ref 0.2–1.2)
Total Protein: 6.8 g/dL (ref 6.0–8.3)

## 2020-10-17 LAB — LIPID PANEL
Cholesterol: 148 mg/dL (ref 0–200)
HDL: 33.1 mg/dL — ABNORMAL LOW (ref >39.00)
LDL Cholesterol: 90 mg/dL (ref 0–99)
NonHDL: 114.79
Total CHOL/HDL Ratio: 4
Triglycerides: 124 mg/dL (ref 0.0–149.0)
VLDL: 24.8 mg/dL (ref 0.0–40.0)

## 2020-10-17 LAB — VITAMIN B12: Vitamin B-12: 550 pg/mL (ref 211–911)

## 2020-10-17 LAB — VITAMIN D 25 HYDROXY (VIT D DEFICIENCY, FRACTURES): VITD: 23.87 ng/mL — ABNORMAL LOW (ref 30.00–100.00)

## 2020-10-17 LAB — PSA: PSA: 0.27 ng/mL (ref 0.10–4.00)

## 2020-10-17 LAB — HEMOGLOBIN A1C: Hgb A1c MFr Bld: 5.6 % (ref 4.6–6.5)

## 2020-10-17 MED ORDER — POTASSIUM CHLORIDE CRYS ER 20 MEQ PO TBCR: 20.0000 meq | EXTENDED_RELEASE_TABLET | Freq: Every day | ORAL | 0 refills | Status: DC

## 2020-10-17 MED ORDER — VITAMIN D (ERGOCALCIFEROL) 1.25 MG (50000 UNIT) PO CAPS: 50000.0000 [IU] | ORAL_CAPSULE | ORAL | 0 refills | Status: AC

## 2020-10-17 NOTE — Telephone Encounter (Signed)
Called and reviewed lab results with patient

## 2020-11-09 ENCOUNTER — Encounter: Payer: Self-pay | Admitting: Internal Medicine

## 2020-11-25 ENCOUNTER — Encounter: Payer: Self-pay | Admitting: Internal Medicine

## 2021-01-25 ENCOUNTER — Other Ambulatory Visit: Payer: Self-pay | Admitting: Internal Medicine

## 2021-03-05 ENCOUNTER — Other Ambulatory Visit: Payer: Self-pay | Admitting: Internal Medicine

## 2021-05-14 ENCOUNTER — Other Ambulatory Visit: Payer: Self-pay | Admitting: Internal Medicine

## 2021-08-11 ENCOUNTER — Other Ambulatory Visit: Payer: Self-pay | Admitting: Internal Medicine

## 2021-08-11 DIAGNOSIS — I10 Essential (primary) hypertension: Secondary | ICD-10-CM

## 2021-08-26 ENCOUNTER — Other Ambulatory Visit: Payer: Self-pay | Admitting: Internal Medicine

## 2021-09-19 ENCOUNTER — Other Ambulatory Visit: Payer: Self-pay | Admitting: Internal Medicine

## 2021-09-19 DIAGNOSIS — I10 Essential (primary) hypertension: Secondary | ICD-10-CM

## 2021-09-22 ENCOUNTER — Encounter: Payer: Self-pay | Admitting: Family Medicine

## 2021-09-22 ENCOUNTER — Telehealth (INDEPENDENT_AMBULATORY_CARE_PROVIDER_SITE_OTHER): Payer: Medicare PPO | Admitting: Family Medicine

## 2021-09-22 VITALS — Ht 67.0 in

## 2021-09-22 DIAGNOSIS — R5383 Other fatigue: Secondary | ICD-10-CM

## 2021-09-22 DIAGNOSIS — E538 Deficiency of other specified B group vitamins: Secondary | ICD-10-CM | POA: Diagnosis not present

## 2021-09-22 DIAGNOSIS — I10 Essential (primary) hypertension: Secondary | ICD-10-CM

## 2021-09-22 DIAGNOSIS — R200 Anesthesia of skin: Secondary | ICD-10-CM

## 2021-09-22 DIAGNOSIS — R202 Paresthesia of skin: Secondary | ICD-10-CM

## 2021-09-22 NOTE — Telephone Encounter (Signed)
Patient needs refill on losartan (COZAAR) 100 MG tablet    Please send to   [id number] - [place],  - [address] AT [facility] OF [facility] [address] Phone:  [phone]  Fax:  [phone]         Please advise

## 2021-09-22 NOTE — Progress Notes (Signed)
Virtual Visit via Video Note I connected with Joel Payne on by a video enabled telemedicine application and verified that I am speaking with the correct person using two identifiers.  Location patient: home Location provider:work office Persons participating in the virtual visit: patient, provider  I discussed the limitations of evaluation and management by telemedicine and the availability of in person appointments. The patient expressed understanding and agreed to proceed.  Chief Complaint  Patient presents with   Fatigue    Fine all day today, may have been temporary.    Tingling    On & off, hasn't had any today. Sometimes it tingles very badly x 6 months.     HPI: Joel Payne is a 76 yo male with hx of B12 deficiency, vitamin D deficiency, hypertension, CAD, atrial fibrillation on chronic anticoagulation, and CVA, who is being seen today because intermittent episodes of feet tingling and fatigue. He has had the symptoms for about 6 months, stable overall. Fatigue is intermittent, about a couple times per week. He sleeps 6 to 8 hours. Nocturia every 2 hours, stable for a while. No associated gross hematuria, dysuria, or decreased urine output.  Negative for witnessed sleep apnea. Reports having a sleep study a few years ago and it was negative. Negative for depression-like symptoms.  Bilateral feet tingling also for about 6 months, it is intermittent, "pricks and needle" like sensation. He has not identified exacerbating or alleviating factors. It is mainly at night, it does not interfere with his sleep. Occasionally feet pruritus, he has not noted erythema or skin rash. Negative for associated fever, abnormal weight loss, night sweats, abdominal pain, nausea, vomiting, changes in bowel habits, blood in the stool, lower back pain, or focal weakness. No new medications.  B12 def: He has been on B12 1000 mcg monthly. B12 supplementation to help with daytime sleepiness. Lab  Results  Component Value Date   VITAMINB12 550 10/16/2020   HTN on metoprolol titrate 25 mg daily, losartan 100 mg daily, and HCTZ 25 mg daily.  Last OV BP was elevated at 180/90, 10/2020. He is not checking BP regularly. Negative for severe/frequent headache, visual changes, chest pain, dyspnea, or worsening edema. Atrial fibrillation on Xarelto 20 mg daily. Occasionally he has some palpitations. No hx of tobacco use.  Once in a while peri ankle edema. HypoK+, completed 5 days of KLOR. Lab Results  Component Value Date   CREATININE 0.87 10/16/2020   BUN 11 10/16/2020   NA 139 10/16/2020   K 3.1 (L) 10/16/2020   CL 98 10/16/2020   CO2 34 (H) 10/16/2020   ROS: See pertinent positives and negatives per HPI.  Past Medical History:  Diagnosis Date   ALLERGIC RHINITIS 11/08/2006   BENIGN PROSTATIC HYPERTROPHY 10/12/2006   CORONARY ARTERY DISEASE 10/12/2006   Catheterization was normal 2012, mild nonobstructive coronary disease, normal LV function   HYPERLIPIDEMIA 11/08/2006   HYPERTENSION 10/12/2006   Kidney stone    in 1980   NEPHROLITHIASIS, HX OF 10/12/2006   PPD positive    Shortness of breath    April, 2012  /  catheterization May, 2012 mild nonobstructive coronary disease   Stroke [facility])    TIA (transient ischemic attack) 10/2015   Urinary frequency 10/18/2009   Past Surgical History:  Procedure Laterality Date   CARDIAC CATHETERIZATION  2012   non radical prostatectomy  08/2011   PROSTATE BIOPSY  2008   TEE WITHOUT CARDIOVERSION N/A 10/31/2015   Procedure: TRANSESOPHAGEAL ECHOCARDIOGRAM (TEE) will alos  have loop;  Surgeon: Josue Hector, MD;  Location: [facility] ENDOSCOPY;  Service: Cardiovascular;  Laterality: N/A;   TONSILLECTOMY     Family History  Problem Relation Age of Onset   Colon cancer Neg Hx    Esophageal cancer Neg Hx    Rectal cancer Neg Hx    Stomach cancer Neg Hx    Social History   Socioeconomic History   Marital status: Married    Spouse name: Not on  file   Number of children: 3   Years of education: PhD   Highest education level: Not on file  Occupational History   Occupation: Anthropology  Tobacco Use   Smoking status: Never   Smokeless tobacco: Never  Substance and Sexual Activity   Alcohol use: No    Alcohol/week: 1.0 standard drink of alcohol    Types: 1 Standard drinks or equivalent per week   Drug use: No   Sexual activity: Not on file  Other Topics Concern   Not on file  Social History Narrative   Lives with significant other   Caffeine use: Coffee- 2 cups decaf per day   No tea/soda   Social Determinants of Health   Financial Resource Strain: Not on file  Food Insecurity: Not on file  Transportation Needs: Not on file  Physical Activity: Not on file  Stress: Not on file  Social Connections: Not on file  Intimate Partner Violence: Not on file   Current Outpatient Medications:    cyanocobalamin (,VITAMIN B-12,) 1000 MCG/ML injection, INJECT 1ML IN DELTOID ONCE WEEKLY FOR 4 WEEKS, THEN INJECT 1 ML ONCE A MONTH THEREAFTER, Disp: 10 mL, Rfl: 1   hydrochlorothiazide (HYDRODIURIL) 25 MG tablet, Take 1 tablet (25 mg total) by mouth daily. SCHEDULE APPT FOR FUTURE REFILLS, Disp: 90 tablet, Rfl: 0   losartan (COZAAR) 100 MG tablet, Take 1 tablet (100 mg total) by mouth daily., Disp: 90 tablet, Rfl: 1   metoprolol tartrate (LOPRESSOR) 25 MG tablet, TAKE 1 TABLET BY MOUTH TWICE A DAY, Disp: 180 tablet, Rfl: 1   pravastatin (PRAVACHOL) 20 MG tablet, TAKE 1 TABLET BY MOUTH EVERY DAY, Disp: 90 tablet, Rfl: 1   SYRINGE-NEEDLE, DISP, 3 ML (B-D 3CC LUER-LOK SYR 25GX1") 25G X 1" 3 ML MISC, USE FOR B12 INJECTIONS, Disp: 10 each, Rfl: PRN   XARELTO 20 MG TABS tablet, TAKE 1 TABLET BY MOUTH DAILY WITH SUPPER, Disp: 90 tablet, Rfl: 1   potassium chloride SA (KLOR-CON) 20 MEQ tablet, Take 1 tablet (20 mEq total) by mouth daily for 5 days., Disp: 5 tablet, Rfl: 0  EXAM:  VITALS per patient if applicable:Ht 5\' 7"  (1.702 m)   BMI 32.20  kg/m   GENERAL: alert, oriented, appears well and in no acute distress  HEENT: atraumatic, conjunctiva clear, no obvious abnormalities on inspection of external nose and ears  NECK: normal movements of the head and neck  LUNGS: on inspection no signs of respiratory distress, breathing rate appears normal, no obvious gross SOB, gasping or wheezing  CV: no obvious cyanosis  MS: moves all visible extremities without noticeable abnormality  PSYCH/NEURO: pleasant and cooperative, no obvious depression or anxiety, speech and thought processing grossly intact  ASSESSMENT AND PLAN:  Discussed the following assessment and plan:  Other fatigue We discussed possible etiologies. History is not suggestive of a serious process. Some of his chronic medical problems as well as medications can be contributing factors. Will arrange blood work, he is planning on traveling to [place] and will  be back after 10/12/2021. Instructed about warning signs.  Numbness and tingling of both feet - Plan: TSH, CBC, Comprehensive metabolic panel, Protein Electrophoresis, Serum, Protein Electrophoresis,Random Urn ?  Peripheral neuropathy. Other possible causes discussed. Monitor for new symptoms. Further recommendation will be given according to lab results.  Vitamin B12 deficiency - Plan: Vitamin B12 Continue B12 1000 mcg every 4 weeks.  Essential hypertension - Plan: Comprehensive metabolic panel Recommend monitoring BP at home. Continue losartan, HCTZ, and metoprolol titrate same dose. Recommend arranging appointment with PCP.  We discussed possible serious and likely etiologies, options for evaluation and workup, limitations of telemedicine visit vs in person visit, treatment, treatment risks and precautions. The patient was advised to call back or seek an in-person evaluation if the symptoms worsen or if the condition fails to improve as anticipated. I discussed the assessment and treatment plan with the  patient. The patient was provided an opportunity to ask questions and all were answered. The patient agreed with the plan and demonstrated an understanding of the instructions.  Return in about 4 weeks (around 10/20/2021).  Wynelle Dreier [place], MD

## 2021-10-15 ENCOUNTER — Other Ambulatory Visit (INDEPENDENT_AMBULATORY_CARE_PROVIDER_SITE_OTHER): Payer: Medicare PPO

## 2021-10-15 DIAGNOSIS — E538 Deficiency of other specified B group vitamins: Secondary | ICD-10-CM

## 2021-10-15 DIAGNOSIS — R200 Anesthesia of skin: Secondary | ICD-10-CM

## 2021-10-15 DIAGNOSIS — I10 Essential (primary) hypertension: Secondary | ICD-10-CM

## 2021-10-15 DIAGNOSIS — R202 Paresthesia of skin: Secondary | ICD-10-CM

## 2021-10-15 LAB — COMPREHENSIVE METABOLIC PANEL
ALT: 24 U/L (ref 0–53)
AST: 22 U/L (ref 0–37)
Albumin: 4.3 g/dL (ref 3.5–5.2)
Alkaline Phosphatase: 49 U/L (ref 39–117)
BUN: 23 mg/dL (ref 6–23)
CO2: 31 mEq/L (ref 19–32)
Calcium: 9.2 mg/dL (ref 8.4–10.5)
Chloride: 98 mEq/L (ref 96–112)
Creatinine, Ser: 1.03 mg/dL (ref 0.40–1.50)
GFR: 70.54 mL/min (ref >60.00)
Glucose, Bld: 94 mg/dL (ref 70–99)
Potassium: 3.4 mEq/L — ABNORMAL LOW (ref 3.5–5.1)
Sodium: 137 mEq/L (ref 135–145)
Total Bilirubin: 0.9 mg/dL (ref 0.2–1.2)
Total Protein: 7.2 g/dL (ref 6.0–8.3)

## 2021-10-15 LAB — CBC
HCT: 43.1 % (ref 39.0–52.0)
Hemoglobin: 14.8 g/dL (ref 13.0–17.0)
MCHC: 34.2 g/dL (ref 30.0–36.0)
MCV: 86.3 fl (ref 78.0–100.0)
Platelets: 186 10*3/uL (ref 150.0–400.0)
RBC: 4.99 Mil/uL (ref 4.22–5.81)
RDW: 13.8 % (ref 11.5–15.5)
WBC: 5.3 10*3/uL (ref 4.0–10.5)

## 2021-10-15 LAB — VITAMIN B12: Vitamin B-12: 828 pg/mL (ref 211–911)

## 2021-10-15 LAB — TSH: TSH: 2.39 u[IU]/mL (ref 0.35–5.50)

## 2021-10-20 ENCOUNTER — Other Ambulatory Visit: Payer: Self-pay | Admitting: Family Medicine

## 2021-10-20 DIAGNOSIS — E876 Hypokalemia: Secondary | ICD-10-CM

## 2021-10-20 LAB — PROTEIN ELECTROPHORESIS,RANDOM URN
Albumin: 38 %
Alpha-1-Globulin, U: 6 %
Alpha-2-Globulin, U: 18 %
Beta Globulin, U: 21 %
Creatinine, Urine: 153 mg/dL (ref 20–320)
Gamma Globulin, U: 16 %
Protein/Creat Ratio: 118 mg/g creat (ref 25–148)
Protein/Creatinine Ratio: 0.118 mg/mg creat (ref 0.025–0.148)
Total Protein, Urine: 18 mg/dL (ref 5–25)

## 2021-10-20 LAB — PROTEIN ELECTROPHORESIS, SERUM
Albumin ELP: 4.1 g/dL (ref 3.8–4.8)
Alpha 1: 0.2 g/dL (ref 0.2–0.3)
Alpha 2: 0.7 g/dL (ref 0.5–0.9)
Beta 2: 0.3 g/dL (ref 0.2–0.5)
Beta Globulin: 0.4 g/dL (ref 0.4–0.6)
Gamma Globulin: 1 g/dL (ref 0.8–1.7)
Total Protein: 6.8 g/dL (ref 6.1–8.1)

## 2021-10-20 MED ORDER — POTASSIUM CHLORIDE CRYS ER 20 MEQ PO TBCR: 20.0000 meq | EXTENDED_RELEASE_TABLET | Freq: Every day | ORAL | 0 refills | Status: DC

## 2021-10-22 ENCOUNTER — Ambulatory Visit: Payer: Medicare PPO | Admitting: Internal Medicine

## 2021-10-22 ENCOUNTER — Encounter: Payer: Self-pay | Admitting: Internal Medicine

## 2021-10-22 VITALS — BP 132/80 | HR 96 | Temp 97.5°F | Resp 16 | Ht 67.0 in | Wt 205.4 lb

## 2021-10-22 DIAGNOSIS — E538 Deficiency of other specified B group vitamins: Secondary | ICD-10-CM

## 2021-10-22 DIAGNOSIS — R5383 Other fatigue: Secondary | ICD-10-CM | POA: Diagnosis not present

## 2021-10-22 DIAGNOSIS — E876 Hypokalemia: Secondary | ICD-10-CM | POA: Diagnosis not present

## 2021-10-22 MED ORDER — "BD LUER-LOK SYRINGE 25G X 1"" 3 ML MISC": 99 refills | Status: DC

## 2021-10-22 NOTE — Progress Notes (Signed)
Established Patient Office Visit     CC/Reason for Visit: F/u fatigue.  HPI: Joel Payne is a 76 y.o. male who is coming in today for the above mentioned reasons. Seen by Dr. [place] on 9/11 for fatigue and numbness. These symptoms are fully resolved. He had a cough and PND that are improved. He was found to be hypokalemic and just picked up KDur prescription. He is requesting syringes for his B12 injections.   Past Medical/Surgical History: Past Medical History:  Diagnosis Date   ALLERGIC RHINITIS 11/08/2006   BENIGN PROSTATIC HYPERTROPHY 10/12/2006   CORONARY ARTERY DISEASE 10/12/2006   Catheterization was normal 2012, mild nonobstructive coronary disease, normal LV function   HYPERLIPIDEMIA 11/08/2006   HYPERTENSION 10/12/2006   Kidney stone    in 1980   NEPHROLITHIASIS, HX OF 10/12/2006   PPD positive    Shortness of breath    April, 2012  /  catheterization May, 2012 mild nonobstructive coronary disease   Stroke [facility])    TIA (transient ischemic attack) 10/2015   Urinary frequency 10/18/2009    Past Surgical History:  Procedure Laterality Date   CARDIAC CATHETERIZATION  2012   non radical prostatectomy  08/2011   PROSTATE BIOPSY  2008   TEE WITHOUT CARDIOVERSION N/A 10/31/2015   Procedure: TRANSESOPHAGEAL ECHOCARDIOGRAM (TEE) will alos have loop;  Surgeon: Wendall Stade, MD;  Location: [facility] ENDOSCOPY;  Service: Cardiovascular;  Laterality: N/A;   TONSILLECTOMY      Social History:  reports that he has never smoked. He has never used smokeless tobacco. He reports that he does not drink alcohol and does not use drugs.  Allergies: Allergies  Allergen Reactions   Simvastatin Other (See Comments)    Leg cramps    Family History:  Family History  Problem Relation Age of Onset   Colon cancer Neg Hx    Esophageal cancer Neg Hx    Rectal cancer Neg Hx    Stomach cancer Neg Hx      Current Outpatient Medications:    cyanocobalamin (,VITAMIN B-12,) 1000  MCG/ML injection, INJECT IN DELTOID ONCE WEEKLY FOR 4 WEEKS, THEN INJECT 1 ML ONCE A MONTH THEREAFTER, Disp: 10 mL, Rfl: 1   hydrochlorothiazide (HYDRODIURIL) 25 MG tablet, Take 1 tablet (25 mg total) by mouth daily. SCHEDULE APPT FOR FUTURE REFILLS, Disp: 90 tablet, Rfl: 0   losartan (COZAAR) 100 MG tablet, TAKE 1 TABLET BY MOUTH EVERY DAY, Disp: 90 tablet, Rfl: 0   metoprolol tartrate (LOPRESSOR) 25 MG tablet, TAKE 1 TABLET BY MOUTH TWICE A DAY, Disp: 180 tablet, Rfl: 1   potassium chloride SA (KLOR-CON M) 20 MEQ tablet, Take 1 tablet (20 mEq total) by mouth daily., Disp: 30 tablet, Rfl: 0   pravastatin (PRAVACHOL) 20 MG tablet, TAKE 1 TABLET BY MOUTH EVERY DAY, Disp: 90 tablet, Rfl: 1   XARELTO 20 MG TABS tablet, TAKE 1 TABLET BY MOUTH DAILY WITH SUPPER, Disp: 90 tablet, Rfl: 1   SYRINGE-NEEDLE, DISP, 3 ML (B-D 3CC LUER-LOK SYR 25GX1") 25G X 1" 3 ML MISC, USE FOR B12 INJECTIONS, Disp: 10 each, Rfl: PRN  Review of Systems:  Constitutional: Denies fever, chills, diaphoresis, appetite change and fatigue.  HEENT: Denies photophobia, eye pain, redness, hearing loss, ear pain, congestion, sore throat, rhinorrhea, sneezing, mouth sores, trouble swallowing, neck pain, neck stiffness and tinnitus.   Respiratory: Denies SOB, DOE, cough, chest tightness,  and wheezing.   Cardiovascular: Denies chest pain, palpitations and leg swelling.  Gastrointestinal: Denies nausea, vomiting, abdominal pain, diarrhea, constipation, blood in stool and abdominal distention.  Genitourinary: Denies dysuria, urgency, frequency, hematuria, flank pain and difficulty urinating.  Endocrine: Denies: hot or cold intolerance, sweats, changes in hair or nails, polyuria, polydipsia. Musculoskeletal: Denies myalgias, back pain, joint swelling, arthralgias and gait problem.  Skin: Denies pallor, rash and wound.  Neurological: Denies dizziness, seizures, syncope, weakness, light-headedness, numbness and headaches.  Hematological:  Denies adenopathy. Easy bruising, personal or family bleeding history  Psychiatric/Behavioral: Denies suicidal ideation, mood changes, confusion, nervousness, sleep disturbance and agitation    Physical Exam: Vitals:   10/22/21 1121  BP: 132/80  Pulse: 96  Resp: 16  Temp: (!) 97.5 F (36.4 C)  TempSrc: Oral  SpO2: 96%  Weight: 205 lb 7 oz (93.2 kg)  Height: 5\' 7"  (1.702 m)    Body mass index is 32.18 kg/m.   Constitutional: NAD, calm, comfortable Eyes: PERRL, lids and conjunctivae normal, wears corrective lenses. ENMT: Mucous membranes are moist.  Respiratory: clear to auscultation bilaterally, no wheezing, no crackles. Normal respiratory effort. No accessory muscle use.  Cardiovascular: Regular rate and rhythm, no murmurs / rubs / gallops. No extremity edema.  Psychiatric: Normal judgment and insight. Alert and oriented x 3. Normal mood.    Impression and Plan:  Fatigue, unspecified type  Hypokalemia  Vitamin B12 deficiency - Plan: SYRINGE-NEEDLE, DISP, 3 ML (B-D 3CC LUER-LOK SYR 25GX1") 25G X 1" 3 ML MISC  -Symptoms have fully resolved. Fatigue likely related to unspecified viral illness. -He has started K supplementation, recheck BMP in 2 weeks. -Syringes for monthly B12 will be sent today.   Time spent:20 minutes reviewing chart, interviewing and examining patient and formulating plan of care.    Lelon Frohlich, MD [facility]

## 2021-10-28 ENCOUNTER — Encounter: Payer: Self-pay | Admitting: Internal Medicine

## 2021-10-28 DIAGNOSIS — J3089 Other allergic rhinitis: Secondary | ICD-10-CM

## 2021-11-06 ENCOUNTER — Other Ambulatory Visit: Payer: Self-pay | Admitting: Internal Medicine

## 2021-11-06 DIAGNOSIS — I10 Essential (primary) hypertension: Secondary | ICD-10-CM

## 2021-11-14 ENCOUNTER — Other Ambulatory Visit: Payer: Self-pay | Admitting: Internal Medicine

## 2021-11-14 DIAGNOSIS — E876 Hypokalemia: Secondary | ICD-10-CM

## 2021-11-19 ENCOUNTER — Encounter: Payer: Self-pay | Admitting: Allergy

## 2021-11-19 ENCOUNTER — Ambulatory Visit: Payer: Medicare PPO | Admitting: Allergy

## 2021-11-19 VITALS — BP 182/88 | HR 67 | Temp 98.4°F | Resp 16 | Ht 67.0 in | Wt 210.9 lb

## 2021-11-19 DIAGNOSIS — R0982 Postnasal drip: Secondary | ICD-10-CM

## 2021-11-19 DIAGNOSIS — J4520 Mild intermittent asthma, uncomplicated: Secondary | ICD-10-CM

## 2021-11-19 DIAGNOSIS — J309 Allergic rhinitis, unspecified: Secondary | ICD-10-CM | POA: Diagnosis not present

## 2021-11-19 MED ORDER — QVAR REDIHALER 80 MCG/ACT IN AERB: 2.0000 | INHALATION_SPRAY | Freq: Two times a day (BID) | RESPIRATORY_TRACT | 5 refills | Status: DC

## 2021-11-19 MED ORDER — AZELASTINE HCL 0.1 % NA SOLN: 2.0000 | Freq: Two times a day (BID) | NASAL | 5 refills | Status: DC | PRN

## 2021-11-19 MED ORDER — LEVOCETIRIZINE DIHYDROCHLORIDE 5 MG PO TABS: 5.0000 mg | ORAL_TABLET | Freq: Every evening | ORAL | 5 refills | Status: DC

## 2021-11-19 MED ORDER — ALBUTEROL SULFATE HFA 108 (90 BASE) MCG/ACT IN AERS: 2.0000 | INHALATION_SPRAY | RESPIRATORY_TRACT | 1 refills | Status: DC | PRN

## 2021-11-19 NOTE — Patient Instructions (Addendum)
-   Testing today showed: grasses, trees, indoor molds, outdoor molds, dust mites, and cockroach. - Copy of test results provided.  - Avoidance measures provided. - Stop taking: Claritin as this is not a very effective antihistamine - Start taking: Xyzal (levocetirizine) 5mg  tablet once daily as needed.   Astelin 2 sprays each nostril 1-2 times a day as needed for nasal drainage control.   - You can use an extra dose of the antihistamine, if needed, for breakthrough symptoms.  - Consider nasal saline rinses 1-2 times daily to remove allergens from the nasal cavities as well as help with mucous clearance (this is especially helpful to do before the nasal sprays are given)  - Lung function testing is lower than expected but did significantly improve after albuterol use in office - Daily controller medication(s): Qvar Redihaler 2 puffs twice daily (or similar inhaler based on insurance coverage) - Rescue medications: albuterol 2 puffs every 4-6 hours as needed  - Asthma control goals:  * Full participation in all desired activities (may need albuterol before activity) * Albuterol use two time or less a week on average (not counting use with activity) * Cough interfering with sleep two time or less a month * Oral steroids no more than once a year * No hospitalizations  Follow-up 3 months or sooner if needed

## 2021-11-19 NOTE — Progress Notes (Signed)
New Patient Note  RE: Joel Payne MRN: [id number] DOB: [birth year] Date of Office Visit: 11/19/2021   Primary care provider: Philip Aspen, Limmie Patricia, MD  Chief Complaint: cough due to drainage  History of present illness: Joel Payne is a 76 y.o. male presenting today for evaluation of cough.    He states for a long time he would periodically have a cough that "I can't get rid of".  She states his wife thought it was allergies.  He states he would be coughing all the time that comes and goes.  He states right now he does not have the cough.  The cough can occur at anytime of the year and does not seem to have a seasonal predilection.  Sometimes he can hear wheezing. Sometimes he can have chest tightness or shortness of breath but is not always.  He states he is not coughing up phlegm but feels like there is nasal drainage down the throat.  The cough/drainage can happen any time of the year.  He has tried cough medications like mucinex, has perform nasal rinses that help briefly.  Has not used any nose sprays.  Has not tried inhaler based medication.  No history of asthma but states his mother had bad asthma and his younger brother has asthma.    Sometimes when he gets in the car and turns on the AC/heat and it starts blowing he can start sneezing.   When he goes to [place] there is a piece of highway when he gets to that area he starting sneezing for about 10 miles.    He had a water leak in the kitchen that is in the work of getting repaired about 3 weeks ago and they found a lot of mold under the house.  This mold on the house has been remedied.   He also reports can have nasal congestion that has occurred mostly at night.   Has tried claritin as needed and not sure if it helped.    No history of eczema or food allergy.      Review of systems: Review of Systems  Constitutional: Negative.   Eyes: Negative.   Respiratory:         See HPI  Cardiovascular: Negative.    Musculoskeletal: Negative.   Skin: Negative.   Allergic/Immunologic: Negative.   Neurological: Negative.     All other systems negative unless noted above in HPI  Past medical history: Past Medical History:  Diagnosis Date   ALLERGIC RHINITIS 11/08/2006   BENIGN PROSTATIC HYPERTROPHY 10/12/2006   CORONARY ARTERY DISEASE 10/12/2006   Catheterization was normal 2012, mild nonobstructive coronary disease, normal LV function   HYPERLIPIDEMIA 11/08/2006   HYPERTENSION 10/12/2006   Kidney stone    in 1980   NEPHROLITHIASIS, HX OF 10/12/2006   PPD positive    Shortness of breath    April, 2012  /  catheterization May, 2012 mild nonobstructive coronary disease   Stroke [facility])    TIA (transient ischemic attack) 10/2015   Urinary frequency 10/18/2009    Past surgical history: Past Surgical History:  Procedure Laterality Date   CARDIAC CATHETERIZATION  2012   non radical prostatectomy  08/2011   PROSTATE BIOPSY  2008   TEE WITHOUT CARDIOVERSION N/A 10/31/2015   Procedure: TRANSESOPHAGEAL ECHOCARDIOGRAM (TEE) will alos have loop;  Surgeon: Wendall Stade, MD;  Location: [facility] ENDOSCOPY;  Service: Cardiovascular;  Laterality: N/A;   TONSILLECTOMY      Family history:  Family History  Problem Relation Age of Onset   Colon cancer Neg Hx    Esophageal cancer Neg Hx    Rectal cancer Neg Hx    Stomach cancer Neg Hx     Social history: Lives in a home without carpeting with gas heating and central cooling.  Dogs and cats in the home.  No concern for roaches in the home.  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  He denies a smoking history.   Medication List: Current Outpatient Medications  Medication Sig Dispense Refill   cyanocobalamin (,VITAMIN B-12,) 1000 MCG/ML injection INJECT IN DELTOID ONCE WEEKLY FOR 4 WEEKS, THEN INJECT 1 ML ONCE A MONTH THEREAFTER 10 mL 1   hydrochlorothiazide (HYDRODIURIL) 25 MG tablet TAKE 1 TABLET (25 MG TOTAL) BY MOUTH DAILY. SCHEDULE APPT FOR FUTURE REFILLS  90 tablet 0   KLOR-CON M20 20 MEQ tablet TAKE 1 TABLET BY MOUTH EVERY DAY 90 tablet 1   losartan (COZAAR) 100 MG tablet TAKE 1 TABLET BY MOUTH EVERY DAY 90 tablet 0   metoprolol tartrate (LOPRESSOR) 25 MG tablet TAKE 1 TABLET BY MOUTH TWICE A DAY 180 tablet 1   pravastatin (PRAVACHOL) 20 MG tablet TAKE 1 TABLET BY MOUTH EVERY DAY 90 tablet 1   SYRINGE-NEEDLE, DISP, 3 ML (B-D 3CC LUER-LOK SYR 25GX1") 25G X 1" 3 ML MISC USE FOR B12 INJECTIONS 10 each PRN   XARELTO 20 MG TABS tablet TAKE 1 TABLET BY MOUTH DAILY WITH SUPPER 90 tablet 1   No current facility-administered medications for this visit.    Known medication allergies: Allergies  Allergen Reactions   Simvastatin Other (See Comments)    Leg cramps     Physical examination: Blood pressure (!) 182/88, pulse 67, temperature 98.4 F (36.9 C), temperature source Temporal, resp. rate 16, height 5\' 7"  (1.702 m), weight 210 lb 14.4 oz (95.7 kg), SpO2 96 %.  General: Alert, interactive, in no acute distress. HEENT: PERRLA, TMs pearly gray, turbinates minimally edematous with clear discharge, post-pharynx non erythematous with positive cobblestoning. Neck: Supple without lymphadenopathy. Lungs: Clear to auscultation without wheezing, rhonchi or rales. {no increased work of breathing. CV: Normal S1, S2 without murmurs. Abdomen: Nondistended, nontender. Skin: Warm and dry, without lesions or rashes. Extremities:  No clubbing, cyanosis or edema. Neuro:   Grossly intact.  Diagnositics/Labs:  Spirometry: FEV1: 1.13L 42%, FVC: 2.12L 59% predicted.  Status post albuterol he had a 20% increase in FEV1 which is significant.  It did not normalize however  Allergy testing:   Airborne Adult Perc - 11/19/21 1019     Time Antigen Placed 1020    Allergen Manufacturer 13/08/23    Location Back    Number of Test 59    1. Control-Buffer 50% Glycerol Negative    2. Control-Histamine 1 mg/ml 2+    3. Albumin saline Negative    4. Bahia Negative     5. Waynette Buttery Negative    6. Johnson Negative    7. Kentucky Blue Negative    8. Meadow Fescue Negative    9. Perennial Rye 2+    10. Sweet Vernal Negative    11. Timothy Negative    12. Cocklebur Negative    13. Burweed Marshelder Negative    14. Ragweed, short Negative    15. Ragweed, Giant Negative    16. Plantain,  English Negative    17. Lamb's Quarters Negative    18. Sheep Sorrell Negative    19. Rough Pigweed Negative  20. Marsh Elder, Rough Negative    21. Mugwort, Common Negative    22. Ash mix Negative    23. Birch mix Negative    24. Beech American Negative    25. Box, Elder Negative    26. Cedar, red Negative    27. Cottonwood, Eastern 2+    28. Elm mix Negative    29. Hickory Negative    30. Maple mix Negative    31. Oak, Guinea-Bissau mix 2+    32. Pecan Pollen Negative    33. Pine mix Negative    34. Sycamore Eastern 2+    35. Walnut, Black Pollen Negative    36. Alternaria alternata Negative    37. Cladosporium Herbarum Negative    38. Aspergillus mix Negative    39. Penicillium mix Negative    40. Bipolaris sorokiniana (Helminthosporium) Negative    41. Drechslera spicifera (Curvularia) 2+    42. Mucor plumbeus Negative    43. Fusarium moniliforme 2+    44. Aureobasidium pullulans (pullulara) Negative    45. Rhizopus oryzae Negative    46. Botrytis cinera Negative    47. Epicoccum nigrum Negative    48. Phoma betae Negative    49. Candida Albicans Negative    50. Trichophyton mentagrophytes 2+    51. Mite, D Farinae  5,000 AU/ml 3+    52. Mite, D Pteronyssinus  5,000 AU/ml 3+    53. Cat Hair 10,000 BAU/ml Negative    54.  Dog Epithelia Negative    55. Mixed Feathers Negative    56. Horse Epithelia Negative    57. Cockroach, German Negative    58. Mouse Negative    59. Tobacco Leaf Negative             Intradermal - 11/19/21 1108     Time Antigen Placed 1110    Allergen Manufacturer Waynette Buttery    Location Arm    Number of Test 8    Control  Negative    Ragweed mix Negative    Weed mix Negative    Mold 1 2+    Mold 2 2+    Cat Negative    Dog Negative    Cockroach 2+             Allergy testing results were read and interpreted by provider, documented by clinical staff.   Assessment and plan: Allergic rhinitis with postnasal drip  - Testing today showed: grasses, trees, indoor molds, outdoor molds, dust mites, and cockroach. - Copy of test results provided.  - Avoidance measures provided. - Stop taking: Claritin as this is not a very effective antihistamine - Start taking: Xyzal (levocetirizine) 5mg  tablet once daily as needed.   Astelin 2 sprays each nostril 1-2 times a day as needed for nasal drainage control.   - You can use an extra dose of the antihistamine, if needed, for breakthrough symptoms.  - Consider nasal saline rinses 1-2 times daily to remove allergens from the nasal cavities as well as help with mucous clearance (this is especially helpful to do before the nasal sprays are given)  Mild intermittent reactive airway - Lung function testing is lower than expected but did significantly improve after albuterol use in office - Daily controller medication(s): Qvar Redihaler 2 puffs twice daily (or similar inhaler based on insurance coverage) - Rescue medications: albuterol 2 puffs every 4-6 hours as needed  - Asthma control goals:  * Full participation in all desired activities (may need albuterol before  activity) * Albuterol use two time or less a week on average (not counting use with activity) * Cough interfering with sleep two time or less a month * Oral steroids no more than once a year * No hospitalizations  Follow-up 3 months or sooner if needed  I appreciate the opportunity to take part in Judy's care. Please do not hesitate to contact me with questions.  Sincerely,   Margo Aye, MD Allergy/Immunology [facility]

## 2021-11-26 ENCOUNTER — Encounter: Payer: Self-pay | Admitting: Internal Medicine

## 2021-11-26 ENCOUNTER — Ambulatory Visit: Payer: Medicare PPO | Admitting: Internal Medicine

## 2021-11-26 VITALS — BP 154/75 | HR 70 | Temp 98.0°F | Wt 211.3 lb

## 2021-11-26 DIAGNOSIS — G8929 Other chronic pain: Secondary | ICD-10-CM | POA: Diagnosis not present

## 2021-11-26 DIAGNOSIS — I10 Essential (primary) hypertension: Secondary | ICD-10-CM | POA: Diagnosis not present

## 2021-11-26 DIAGNOSIS — M25511 Pain in right shoulder: Secondary | ICD-10-CM

## 2021-11-26 MED ORDER — RIVAROXABAN 20 MG PO TABS: 20.0000 mg | ORAL_TABLET | Freq: Every day | ORAL | 1 refills | Status: DC

## 2021-11-26 NOTE — Progress Notes (Signed)
Established Patient Office Visit     CC/Reason for Visit: Right shoulder pain  HPI: Joel Payne is a 76 y.o. male who is coming in today for the above mentioned reasons. Past Medical History is significant for: Hypertension, hyperlipidemia, atrial fibrillation.  For the past 6 to 8 months he has been having right shoulder pain.  Pain is more significant when he rotates his shoulder as if signaling a right-hand turn.  He does not have neck pain.  He does not have radiculopathy although he will sometimes have pain extending down into the bicep area.  He is also noted to be hypertensive today.   Past Medical/Surgical History: Past Medical History:  Diagnosis Date   ALLERGIC RHINITIS 11/08/2006   BENIGN PROSTATIC HYPERTROPHY 10/12/2006   CORONARY ARTERY DISEASE 10/12/2006   Catheterization was normal 2012, mild nonobstructive coronary disease, normal LV function   HYPERLIPIDEMIA 11/08/2006   HYPERTENSION 10/12/2006   Kidney stone    in 1980   NEPHROLITHIASIS, HX OF 10/12/2006   PPD positive    Shortness of breath    April, 2012  /  catheterization May, 2012 mild nonobstructive coronary disease   Stroke [facility])    TIA (transient ischemic attack) 10/2015   Urinary frequency 10/18/2009    Past Surgical History:  Procedure Laterality Date   CARDIAC CATHETERIZATION  2012   non radical prostatectomy  08/2011   PROSTATE BIOPSY  2008   TEE WITHOUT CARDIOVERSION N/A 10/31/2015   Procedure: TRANSESOPHAGEAL ECHOCARDIOGRAM (TEE) will alos have loop;  Surgeon: Wendall Stade, MD;  Location: [facility] ENDOSCOPY;  Service: Cardiovascular;  Laterality: N/A;   TONSILLECTOMY      Social History:  reports that he has never smoked. He has never used smokeless tobacco. He reports that he does not drink alcohol and does not use drugs.  Allergies: Allergies  Allergen Reactions   Simvastatin Other (See Comments)    Leg cramps    Family History:  Family History  Problem Relation Age of Onset    Colon cancer Neg Hx    Esophageal cancer Neg Hx    Rectal cancer Neg Hx    Stomach cancer Neg Hx      Current Outpatient Medications:    albuterol (VENTOLIN HFA) 108 (90 Base) MCG/ACT inhaler, Inhale 2 puffs into the lungs every 4 (four) hours as needed (Cough, wheeze, shortness of breath, chest tightness)., Disp: 18 g, Rfl: 1   azelastine (ASTELIN) 0.1 % nasal spray, Place 2 sprays into both nostrils 2 (two) times daily as needed (Nasal drainage). Use in each nostril as directed, Disp: 30 mL, Rfl: 5   beclomethasone (QVAR REDIHALER) 80 MCG/ACT inhaler, Inhale 2 puffs into the lungs 2 (two) times daily., Disp: 1 each, Rfl: 5   cyanocobalamin (,VITAMIN B-12,) 1000 MCG/ML injection, INJECT IN DELTOID ONCE WEEKLY FOR 4 WEEKS, THEN INJECT 1 ML ONCE A MONTH THEREAFTER, Disp: 10 mL, Rfl: 1   hydrochlorothiazide (HYDRODIURIL) 25 MG tablet, TAKE 1 TABLET (25 MG TOTAL) BY MOUTH DAILY. SCHEDULE APPT FOR FUTURE REFILLS, Disp: 90 tablet, Rfl: 0   KLOR-CON M20 20 MEQ tablet, TAKE 1 TABLET BY MOUTH EVERY DAY, Disp: 90 tablet, Rfl: 1   levocetirizine (XYZAL) 5 MG tablet, Take 1 tablet (5 mg total) by mouth every evening., Disp: 30 tablet, Rfl: 5   losartan (COZAAR) 100 MG tablet, TAKE 1 TABLET BY MOUTH EVERY DAY, Disp: 90 tablet, Rfl: 0   metoprolol tartrate (LOPRESSOR) 25 MG tablet, TAKE 1  TABLET BY MOUTH TWICE A DAY, Disp: 180 tablet, Rfl: 1   pravastatin (PRAVACHOL) 20 MG tablet, TAKE 1 TABLET BY MOUTH EVERY DAY, Disp: 90 tablet, Rfl: 1   SYRINGE-NEEDLE, DISP, 3 ML (B-D 3CC LUER-LOK SYR 25GX1") 25G X 1" 3 ML MISC, USE FOR B12 INJECTIONS, Disp: 10 each, Rfl: PRN   rivaroxaban (XARELTO) 20 MG TABS tablet, Take 1 tablet (20 mg total) by mouth daily with supper., Disp: 90 tablet, Rfl: 1  Review of Systems:  Constitutional: Denies fever, chills, diaphoresis, appetite change and fatigue.  HEENT: Denies photophobia, eye pain, redness, hearing loss, ear pain, congestion, sore throat, rhinorrhea, sneezing,  mouth sores, trouble swallowing, neck pain, neck stiffness and tinnitus.   Respiratory: Denies SOB, DOE, cough, chest tightness,  and wheezing.   Cardiovascular: Denies chest pain, palpitations and leg swelling.  Gastrointestinal: Denies nausea, vomiting, abdominal pain, diarrhea, constipation, blood in stool and abdominal distention.  Genitourinary: Denies dysuria, urgency, frequency, hematuria, flank pain and difficulty urinating.  Endocrine: Denies: hot or cold intolerance, sweats, changes in hair or nails, polyuria, polydipsia. Musculoskeletal: Denies myalgias, back pain, joint swelling,and gait problem.  Skin: Denies pallor, rash and wound.  Neurological: Denies dizziness, seizures, syncope, weakness, light-headedness, numbness and headaches.  Hematological: Denies adenopathy. Easy bruising, personal or family bleeding history  Psychiatric/Behavioral: Denies suicidal ideation, mood changes, confusion, nervousness, sleep disturbance and agitation    Physical Exam: Vitals:   11/26/21 1024 11/26/21 1027  BP: (!) 140/78 (!) 154/75  Pulse: 70   Temp: 98 F (36.7 C)   TempSrc: Oral   SpO2: 97%   Weight: 211 lb 4.8 oz (95.8 kg)     Body mass index is 33.09 kg/m.   Constitutional: NAD, calm, comfortable Eyes: PERRL, lids and conjunctivae normal, wears corrective lenses ENMT: Mucous membranes are moist. Musculoskeletal: He has full range of motion on lateral raise and frontal raise although painful once past 90 degrees.  He has significant pain while rotating hands as if signaling a right-hand turn, putting on seatbelt in a car is painful. Psychiatric: Normal judgment and insight. Alert and oriented x 3. Normal mood.    Impression and Plan:  Chronic right shoulder pain - Plan: Ambulatory referral to Orthopedic Surgery  Essential hypertension  -Suspect right shoulder pain might be adhesive capsulitis.  Have placed orthopedic referral for further work-up. -Blood pressure is  elevated today on 2 separate measurements.  He is compliant with hydrochlorothiazide, losartan, metoprolol.  He will do ambulatory blood pressure measurements.  He already has an appointment in 6 weeks for follow-up.  Time spent:31 minutes reviewing chart, interviewing and examining patient and formulating plan of care.      Chaya Jan, MD [facility]

## 2021-12-10 ENCOUNTER — Encounter: Payer: Self-pay | Admitting: Allergy

## 2021-12-10 NOTE — Telephone Encounter (Signed)
Will call the pharmacy tomorrow and look into working on a Prior Authorization for QVAR.

## 2021-12-12 ENCOUNTER — Other Ambulatory Visit: Payer: Self-pay | Admitting: *Deleted

## 2021-12-12 MED ORDER — FLOVENT HFA 110 MCG/ACT IN AERO: 2.0000 | INHALATION_SPRAY | Freq: Two times a day (BID) | RESPIRATORY_TRACT | 5 refills | Status: DC

## 2021-12-12 NOTE — Telephone Encounter (Signed)
I checked with insurance and they stated that preferred alternatives with insurance are Arnuity, Flovent Diskus, and Flovent HFA. Do you want to switch the patient from QVAR to one of the covered alternatives?

## 2021-12-19 ENCOUNTER — Other Ambulatory Visit: Payer: Self-pay | Admitting: Internal Medicine

## 2021-12-19 DIAGNOSIS — I10 Essential (primary) hypertension: Secondary | ICD-10-CM

## 2021-12-29 DIAGNOSIS — M25511 Pain in right shoulder: Secondary | ICD-10-CM | POA: Diagnosis not present

## 2022-01-12 ENCOUNTER — Other Ambulatory Visit: Payer: Self-pay | Admitting: Internal Medicine

## 2022-01-20 ENCOUNTER — Encounter: Payer: Self-pay | Admitting: Internal Medicine

## 2022-01-20 ENCOUNTER — Ambulatory Visit (INDEPENDENT_AMBULATORY_CARE_PROVIDER_SITE_OTHER): Payer: Medicare PPO | Admitting: Internal Medicine

## 2022-01-20 VITALS — BP 127/80 | HR 67 | Temp 97.6°F | Ht 66.5 in | Wt 201.8 lb

## 2022-01-20 DIAGNOSIS — Z0000 Encounter for general adult medical examination without abnormal findings: Secondary | ICD-10-CM | POA: Diagnosis not present

## 2022-01-20 DIAGNOSIS — I480 Paroxysmal atrial fibrillation: Secondary | ICD-10-CM | POA: Diagnosis not present

## 2022-01-20 DIAGNOSIS — R351 Nocturia: Secondary | ICD-10-CM | POA: Diagnosis not present

## 2022-01-20 DIAGNOSIS — E538 Deficiency of other specified B group vitamins: Secondary | ICD-10-CM

## 2022-01-20 DIAGNOSIS — H9190 Unspecified hearing loss, unspecified ear: Secondary | ICD-10-CM | POA: Diagnosis not present

## 2022-01-20 DIAGNOSIS — N401 Enlarged prostate with lower urinary tract symptoms: Secondary | ICD-10-CM

## 2022-01-20 DIAGNOSIS — I10 Essential (primary) hypertension: Secondary | ICD-10-CM

## 2022-01-20 DIAGNOSIS — E559 Vitamin D deficiency, unspecified: Secondary | ICD-10-CM

## 2022-01-20 DIAGNOSIS — E785 Hyperlipidemia, unspecified: Secondary | ICD-10-CM

## 2022-01-20 LAB — COMPREHENSIVE METABOLIC PANEL
ALT: 20 U/L (ref 0–53)
AST: 17 U/L (ref 0–37)
Albumin: 4.5 g/dL (ref 3.5–5.2)
Alkaline Phosphatase: 43 U/L (ref 39–117)
BUN: 19 mg/dL (ref 6–23)
CO2: 29 mEq/L (ref 19–32)
Calcium: 9.3 mg/dL (ref 8.4–10.5)
Chloride: 103 mEq/L (ref 96–112)
Creatinine, Ser: 0.92 mg/dL (ref 0.40–1.50)
GFR: 80.63 mL/min (ref >60.00)
Glucose, Bld: 95 mg/dL (ref 70–99)
Potassium: 3.9 mEq/L (ref 3.5–5.1)
Sodium: 143 mEq/L (ref 135–145)
Total Bilirubin: 0.7 mg/dL (ref 0.2–1.2)
Total Protein: 7.1 g/dL (ref 6.0–8.3)

## 2022-01-20 LAB — CBC WITH DIFFERENTIAL/PLATELET
Basophils Absolute: 0 10*3/uL (ref 0.0–0.1)
Basophils Relative: 0.6 % (ref 0.0–3.0)
Eosinophils Absolute: 0.1 10*3/uL (ref 0.0–0.7)
Eosinophils Relative: 1.7 % (ref 0.0–5.0)
HCT: 46 % (ref 39.0–52.0)
Hemoglobin: 15.8 g/dL (ref 13.0–17.0)
Lymphocytes Relative: 18.1 % (ref 12.0–46.0)
Lymphs Abs: 1 10*3/uL (ref 0.7–4.0)
MCHC: 34.3 g/dL (ref 30.0–36.0)
MCV: 85.9 fl (ref 78.0–100.0)
Monocytes Absolute: 0.6 10*3/uL (ref 0.1–1.0)
Monocytes Relative: 10.2 % (ref 3.0–12.0)
Neutro Abs: 4 10*3/uL (ref 1.4–7.7)
Neutrophils Relative %: 69.4 % (ref 43.0–77.0)
Platelets: 241 10*3/uL (ref 150.0–400.0)
RBC: 5.36 Mil/uL (ref 4.22–5.81)
RDW: 13.4 % (ref 11.5–15.5)
WBC: 5.8 10*3/uL (ref 4.0–10.5)

## 2022-01-20 LAB — LIPID PANEL
Cholesterol: 168 mg/dL (ref 0–200)
HDL: 41.9 mg/dL (ref >39.00)
LDL Cholesterol: 112 mg/dL — ABNORMAL HIGH (ref 0–99)
NonHDL: 126.04
Total CHOL/HDL Ratio: 4
Triglycerides: 68 mg/dL (ref 0.0–149.0)
VLDL: 13.6 mg/dL (ref 0.0–40.0)

## 2022-01-20 LAB — HEMOGLOBIN A1C: Hgb A1c MFr Bld: 5.7 % (ref 4.6–6.5)

## 2022-01-20 LAB — PSA: PSA: 0.57 ng/mL (ref 0.10–4.00)

## 2022-01-20 LAB — VITAMIN B12: Vitamin B-12: 525 pg/mL (ref 211–911)

## 2022-01-20 LAB — VITAMIN D 25 HYDROXY (VIT D DEFICIENCY, FRACTURES): VITD: 41.63 ng/mL (ref 30.00–100.00)

## 2022-01-20 MED ORDER — "BD LUER-LOK SYRINGE 25G X 1"" 3 ML MISC": 99 refills | Status: DC

## 2022-01-20 MED ORDER — CYANOCOBALAMIN 1000 MCG/ML IJ SOLN: 100.0000 ug | INTRAMUSCULAR | 3 refills | Status: DC

## 2022-01-20 NOTE — Progress Notes (Signed)
Established Patient Office Visit     CC/Reason for Visit: Annual preventive exam and subsequent Medicare wellness visit  HPI: Joel Payne is a 77 y.o. male who is coming in today for the above mentioned reasons. Past Medical History is significant for:  Hypertension, hyperlipidemia, atrial fibrillation.  He has been seeing an orthopedist for his right shoulder pain.  He has routine eye and dental care, is having difficulty hearing.  All immunizations are up-to-date.  I had requested ambulatory blood pressure measurements as his blood pressure was slightly elevated at last in office visit.  Ambulatory measurements seem to average around 1 40-1 50 systolic over 70s to 80s diastolic.  He is otherwise feeling well.   Past Medical/Surgical History: Past Medical History:  Diagnosis Date   ALLERGIC RHINITIS 11/08/2006   BENIGN PROSTATIC HYPERTROPHY 10/12/2006   CORONARY ARTERY DISEASE 10/12/2006   Catheterization was normal 2012, mild nonobstructive coronary disease, normal LV function   HYPERLIPIDEMIA 11/08/2006   HYPERTENSION 10/12/2006   Kidney stone    in 1980   NEPHROLITHIASIS, HX OF 10/12/2006   PPD positive    Shortness of breath    April, 2012  /  catheterization May, 2012 mild nonobstructive coronary disease   Stroke [facility])    TIA (transient ischemic attack) 10/2015   Urinary frequency 10/18/2009    Past Surgical History:  Procedure Laterality Date   CARDIAC CATHETERIZATION  2012   non radical prostatectomy  08/2011   PROSTATE BIOPSY  2008   TEE WITHOUT CARDIOVERSION N/A 10/31/2015   Procedure: TRANSESOPHAGEAL ECHOCARDIOGRAM (TEE) will alos have loop;  Surgeon: Wendall Stade, MD;  Location: [facility] ENDOSCOPY;  Service: Cardiovascular;  Laterality: N/A;   TONSILLECTOMY      Social History:  reports that he has never smoked. He has never used smokeless tobacco. He reports that he does not drink alcohol and does not use drugs.  Allergies: Allergies  Allergen Reactions    Simvastatin Other (See Comments)    Leg cramps    Family History:  Family History  Problem Relation Age of Onset   Colon cancer Neg Hx    Esophageal cancer Neg Hx    Rectal cancer Neg Hx    Stomach cancer Neg Hx      Current Outpatient Medications:    albuterol (VENTOLIN HFA) 108 (90 Base) MCG/ACT inhaler, Inhale 2 puffs into the lungs every 4 (four) hours as needed (Cough, wheeze, shortness of breath, chest tightness)., Disp: 18 g, Rfl: 1   azelastine (ASTELIN) 0.1 % nasal spray, Place 2 sprays into both nostrils 2 (two) times daily as needed (Nasal drainage). Use in each nostril as directed, Disp: 30 mL, Rfl: 5   beclomethasone (QVAR REDIHALER) 80 MCG/ACT inhaler, Inhale 2 puffs into the lungs 2 (two) times daily., Disp: 1 each, Rfl: 5   FLOVENT HFA 110 MCG/ACT inhaler, Inhale 2 puffs into the lungs in the morning and at bedtime., Disp: 12 g, Rfl: 5   hydrochlorothiazide (HYDRODIURIL) 25 MG tablet, TAKE 1 TABLET (25 MG TOTAL) BY MOUTH DAILY. SCHEDULE APPT FOR FUTURE REFILLS, Disp: 90 tablet, Rfl: 0   KLOR-CON M20 20 MEQ tablet, TAKE 1 TABLET BY MOUTH EVERY DAY, Disp: 90 tablet, Rfl: 1   levocetirizine (XYZAL) 5 MG tablet, Take 1 tablet (5 mg total) by mouth every evening., Disp: 30 tablet, Rfl: 5   losartan (COZAAR) 100 MG tablet, TAKE 1 TABLET BY MOUTH EVERY DAY, Disp: 90 tablet, Rfl: 0   metoprolol tartrate (  LOPRESSOR) 25 MG tablet, TAKE 1 TABLET BY MOUTH TWICE A DAY, Disp: 180 tablet, Rfl: 1   pravastatin (PRAVACHOL) 20 MG tablet, TAKE 1 TABLET BY MOUTH EVERY DAY, Disp: 90 tablet, Rfl: 1   rivaroxaban (XARELTO) 20 MG TABS tablet, Take 1 tablet (20 mg total) by mouth daily with supper., Disp: 90 tablet, Rfl: 1   cyanocobalamin (VITAMIN B12) 1000 MCG/ML injection, Inject 0.1 mLs (100 mcg total) into the muscle every 30 (thirty) days., Disp: 6 mL, Rfl: 3   SYRINGE-NEEDLE, DISP, 3 ML (B-D 3CC LUER-LOK SYR 25GX1") 25G X 1" 3 ML MISC, USE FOR B12 INJECTIONS, Disp: 10 each, Rfl:  PRN  Review of Systems:  Constitutional: Denies fever, chills, diaphoresis, appetite change and fatigue.  HEENT: Denies photophobia, eye pain, redness, hearing loss, ear pain, congestion, sore throat, rhinorrhea, sneezing, mouth sores, trouble swallowing, neck pain, neck stiffness and tinnitus.   Respiratory: Denies SOB, DOE, cough, chest tightness,  and wheezing.   Cardiovascular: Denies chest pain, palpitations and leg swelling.  Gastrointestinal: Denies nausea, vomiting, abdominal pain, diarrhea, constipation, blood in stool and abdominal distention.  Genitourinary: Denies dysuria, urgency, frequency, hematuria, flank pain and difficulty urinating.  Endocrine: Denies: hot or cold intolerance, sweats, changes in hair or nails, polyuria, polydipsia. Musculoskeletal: Denies myalgias, back pain, joint swelling, arthralgias and gait problem.  Skin: Denies pallor, rash and wound.  Neurological: Denies dizziness, seizures, syncope, weakness, light-headedness, numbness and headaches.  Hematological: Denies adenopathy. Easy bruising, personal or family bleeding history  Psychiatric/Behavioral: Denies suicidal ideation, mood changes, confusion, nervousness, sleep disturbance and agitation    Physical Exam: Vitals:   01/20/22 1123 01/20/22 1127  BP: (!) 140/80 127/80  Pulse: 67   Temp: 97.6 F (36.4 C)   TempSrc: Oral   SpO2: 96%   Weight: 201 lb 12.8 oz (91.5 kg)   Height: 5' 6.5" (1.689 m)     Body mass index is 32.08 kg/m.   Constitutional: NAD, calm, comfortable Eyes: PERRL, lids and conjunctivae normal, wears corrective lenses ENMT: Mucous membranes are moist. Posterior pharynx clear of any exudate or lesions. Normal dentition. Tympanic membrane is pearly white, no erythema or bulging. Neck: normal, supple, no masses, no thyromegaly Respiratory: clear to auscultation bilaterally, no wheezing, no crackles. Normal respiratory effort. No accessory muscle use.  Cardiovascular:  Regular rate and rhythm, no murmurs / rubs / gallops. No extremity edema. 2+ pedal pulses. No carotid bruits.  Abdomen: no tenderness, no masses palpated. No hepatosplenomegaly. Bowel sounds positive.  Musculoskeletal: no clubbing / cyanosis. No joint deformity upper and lower extremities. Good ROM, no contractures. Normal muscle tone.  Skin: no rashes, lesions, ulcers. No induration Neurologic: CN 2-12 grossly intact. Sensation intact, DTR normal. Strength 5/5 in all 4.  Psychiatric: Normal judgment and insight. Alert and oriented x 3. Normal mood.   Subsequent Medicare wellness visit   1. Risk factors, based on past  M,S,F -cardiovascular disease risk factors include age, gender, history of hypertension and hyperlipidemia, known A-fib   2.  Physical activities: Remains fairly physically active   3.  Depression/mood: Stable, not depressed   4.  Hearing: Decreased hearing bilaterally   5.  ADL's: Independent in all ADLs   6.  Fall risk: Low fall risk   7.  Home safety: No problems identified   8.  Height weight, and visual acuity: height and weight as above, vision:  Vision Screening   Right eye Left eye Both eyes  Without correction     With correction  20/20 20/20 20/20      9.  Counseling: Advised increase physical activity and continue blood pressure measurements   10. Lab orders based on risk factors: Laboratory update will be reviewed   11. Referral : Audiology   12. Care plan: Follow-up with me in 3 to 4 months   13. Cognitive assessment: No cognitive impairment   14. Screening: Patient provided with a written and personalized 5-10 year screening schedule in the AVS. yes   15. Provider List Update: PCP only  16. Advance Directives: Full code   17. Opioids: Patient is not on any opioid prescriptions and has no risk factors for a substance use disorder.   [place] Office Visit from 01/20/2022 in [place] [facility] [place]  PHQ-9 Total Score 4           10/16/2020    2:37 PM 10/20/2021    8:05 AM 10/22/2021   11:28 AM 11/26/2021   10:28 AM 01/20/2022   11:22 AM  Fall Risk  Falls in the past year? 0 0 0 1 0  Was there an injury with Fall? 0  0 0 0  Fall Risk Category Calculator 0  0 2 0  Fall Risk Category Low  Low Moderate Low  Patient Fall Risk Level   Low fall risk Moderate fall risk Low fall risk  Patient at Risk for Falls Due to   No Fall Risks Impaired balance/gait No Fall Risks  Fall risk Follow up   Falls evaluation completed Falls evaluation completed Falls evaluation completed      Impression and Plan:  Encounter for preventive health examination  Vitamin B12 deficiency - Plan: SYRINGE-NEEDLE, DISP, 3 ML (B-D 3CC LUER-LOK SYR 25GX1") 25G X 1" 3 ML MISC, Vitamin B12, Vitamin B12  Hearing loss, unspecified hearing loss type, unspecified laterality - Plan: Ambulatory referral to Audiology  Dyslipidemia - Plan: Lipid panel, Lipid panel  Essential hypertension  Benign prostatic hyperplasia with nocturia - Plan: CBC with Differential/Platelet, Comprehensive metabolic panel, Hemoglobin A1c, PSA, PSA, Hemoglobin A1c, Comprehensive metabolic panel, CBC with Differential/Platelet  Paroxysmal atrial fibrillation (HCC)  Vitamin D deficiency - Plan: VITAMIN D 25 Hydroxy (Vit-D Deficiency, Fractures), VITAMIN D 25 Hydroxy (Vit-D Deficiency, Fractures)  -Recommend routine eye and dental care. -Immunizations: All immunizations are up-to-date -Healthy lifestyle discussed in detail. -Labs to be updated today. -Colon cancer screening: 03/2012, we have decided no further due to age -Breast cancer screening: Not applicable -Cervical cancer screening: Not applicable -Lung cancer screening: Not applicable -Prostate cancer screening: PSA today -DEXA: Not applicable     Janney Priego Isaac Bliss, MD [facility]

## 2022-01-28 DIAGNOSIS — M25511 Pain in right shoulder: Secondary | ICD-10-CM | POA: Diagnosis not present

## 2022-01-29 ENCOUNTER — Ambulatory Visit: Payer: Medicare PPO | Attending: Audiologist | Admitting: Audiologist

## 2022-01-29 DIAGNOSIS — H903 Sensorineural hearing loss, bilateral: Secondary | ICD-10-CM | POA: Insufficient documentation

## 2022-01-29 NOTE — Procedures (Signed)
  Outpatient Audiology and [place], [place]  [address] [phone]  AUDIOLOGICAL  EVALUATION  NAME: Joel Payne     DOB:   [birth year]      MRN: [id number]                                                                                     DATE: 01/29/2022     REFERENT: Isaac Bliss, Rayford Halsted, MD STATUS: Outpatient DIAGNOSIS: Sensorineural Hearing Loss    History: Joel Payne was seen for an audiological evaluation.  Joel Payne is receiving a hearing evaluation due to concerns for his hearing reported by his wife and daughter. Joel Payne has difficulty hearing certain people. This difficulty began gradually. No pain or pressure reported in either ear. Tinnitus denied ears. 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  Medical history positive for negative for a health condition which is a risk factor for hearing loss. No other relevant case history reported.   Evaluation:  Otoscopy showed a clear view of the tympanic membranes, bilaterally Tympanometry results were consistent with normal hearing in each ear Audiometric testing was completed using conventional audiometry with supraural high frequency transducer. Speech Recognition Thresholds were 20dB in the right ear and 20dB in the left ear. Word Recognition was performed  40dB SL, scored 96 % in the right ear and 100% in the left ear. Pure tone thresholds show normal sloping to severe sensorineural hearing loss in each ear.   Results:  The test results were reviewed with Joel Payne. Joel Payne has normal hearing in the low pitches sloping after 1.5kHz to a severe sensorineural hearing loss in each ear in the high frequencies. Hearing is symmetric. Joel Payne hearing loss makes him a hearing aid candidate, however due to the steep slope of his loss expectations for success with aids needs to be limited at this time. Joel Payne would prefer to wait to use hearing aids. Communicate face to face  within five feet.   Recommendations: 1.  Recommend Joel Payne has annual audiometric testing in each ear. Joel Payne does not feel he will benefit from daily hearing aid use.    37 minutes spent testing and counseling on results.   Alfonse Alpers  Audiologist, Au.D., CCC-A 01/29/2022  11:37 AM  Cc: Isaac Bliss, Rayford Halsted, MD

## 2022-01-31 ENCOUNTER — Other Ambulatory Visit: Payer: Self-pay | Admitting: Internal Medicine

## 2022-01-31 DIAGNOSIS — I10 Essential (primary) hypertension: Secondary | ICD-10-CM

## 2022-02-08 ENCOUNTER — Other Ambulatory Visit: Payer: Self-pay | Admitting: Internal Medicine

## 2022-02-08 DIAGNOSIS — I10 Essential (primary) hypertension: Secondary | ICD-10-CM

## 2022-02-19 ENCOUNTER — Ambulatory Visit: Payer: Medicare PPO | Admitting: Allergy

## 2022-02-26 ENCOUNTER — Ambulatory Visit: Payer: Medicare PPO | Admitting: Allergy

## 2022-02-26 ENCOUNTER — Other Ambulatory Visit: Payer: Self-pay

## 2022-02-26 ENCOUNTER — Encounter: Payer: Self-pay | Admitting: Allergy

## 2022-02-26 VITALS — BP 160/82 | HR 53 | Temp 98.3°F | Resp 16 | Ht 67.0 in | Wt 212.3 lb

## 2022-02-26 DIAGNOSIS — R0982 Postnasal drip: Secondary | ICD-10-CM

## 2022-02-26 DIAGNOSIS — J309 Allergic rhinitis, unspecified: Secondary | ICD-10-CM | POA: Diagnosis not present

## 2022-02-26 DIAGNOSIS — J4520 Mild intermittent asthma, uncomplicated: Secondary | ICD-10-CM

## 2022-02-26 MED ORDER — AZELASTINE HCL 0.1 % NA SOLN: 2.0000 | NASAL | 5 refills | Status: DC

## 2022-02-26 MED ORDER — QVAR REDIHALER 80 MCG/ACT IN AERB: 2.0000 | INHALATION_SPRAY | Freq: Two times a day (BID) | RESPIRATORY_TRACT | 5 refills | Status: DC

## 2022-02-26 MED ORDER — ALBUTEROL SULFATE HFA 108 (90 BASE) MCG/ACT IN AERS: INHALATION_SPRAY | RESPIRATORY_TRACT | 1 refills | Status: DC

## 2022-02-26 MED ORDER — LEVOCETIRIZINE DIHYDROCHLORIDE 5 MG PO TABS: 5.0000 mg | ORAL_TABLET | Freq: Every day | ORAL | 5 refills | Status: DC

## 2022-02-26 NOTE — Progress Notes (Signed)
Follow-up Note  RE: Joel Payne MRN: [id number] DOB: [birth year] Date of Office Visit: 02/26/2022   History of present illness: Joel Payne is a 77 y.o. male presenting today for follow-up of allergic rhinitis with PND and reactive airway.  He was last seen in the office on 11/19/21 by myself.  He has been doing better since his last visit.  He states he is less congested in the mornings and has been having less sneezing episodes.  He does find the xyzal to be more effective than the claritin.  He also finds the astelin nasal spray helpful as well for drainage control. With his breathing he does find the Qvar device to be easier to use and he is using 2 puffs twice a day.  He has not needed to use albuterol at all since using the Qvar.  He has not had any ED/UC visits for his breathing nor has he required any systemic steroid needs.    Review of systems: Review of Systems  Constitutional: Negative.   HENT: Negative.    Eyes: Negative.   Respiratory: Negative.    Cardiovascular: Negative.   Musculoskeletal: Negative.   Skin: Negative.   Allergic/Immunologic: Negative.   Neurological: Negative.      All other systems negative unless noted above in HPI  Past medical/social/surgical/family history have been reviewed and are unchanged unless specifically indicated below.  No changes  Medication List: Current Outpatient Medications  Medication Sig Dispense Refill   albuterol (VENTOLIN HFA) 108 (90 Base) MCG/ACT inhaler 2 puffs every 4-6 hours prn 8 g 1   azelastine (ASTELIN) 0.1 % nasal spray Place 2 sprays into both nostrils as directed. 2 sprays  each nostril 1 to 2 times a day as needed for nasal drainage control 30 mL 5   beclomethasone (QVAR REDIHALER) 80 MCG/ACT inhaler Inhale 2 puffs into the lungs 2 (two) times daily. 1 each 5   cyanocobalamin (VITAMIN B12) 1000 MCG/ML injection Inject 0.1 mLs (100 mcg total) into the muscle every 30 (thirty) days. 6 mL 3    hydrochlorothiazide (HYDRODIURIL) 25 MG tablet TAKE 1 TABLET BY MOUTH DAILY. SCHEDULE APPT FOR FUTURE REFILLS 90 tablet 1   KLOR-CON M20 20 MEQ tablet TAKE 1 TABLET BY MOUTH EVERY DAY 90 tablet 1   levocetirizine (XYZAL) 5 MG tablet Take 1 tablet (5 mg total) by mouth daily. 30 tablet 5   losartan (COZAAR) 100 MG tablet TAKE 1 TABLET BY MOUTH EVERY DAY 90 tablet 1   metoprolol tartrate (LOPRESSOR) 25 MG tablet TAKE 1 TABLET BY MOUTH TWICE A DAY 180 tablet 1   pravastatin (PRAVACHOL) 20 MG tablet TAKE 1 TABLET BY MOUTH EVERY DAY 90 tablet 1   rivaroxaban (XARELTO) 20 MG TABS tablet Take 1 tablet (20 mg total) by mouth daily with supper. 90 tablet 1   SYRINGE-NEEDLE, DISP, 3 ML (B-D 3CC LUER-LOK SYR 25GX1") 25G X 1" 3 ML MISC USE FOR B12 INJECTIONS 10 each PRN   No current facility-administered medications for this visit.     Known medication allergies: Allergies  Allergen Reactions   Simvastatin Other (See Comments)    Leg cramps     Physical examination: Blood pressure (!) 160/82, pulse (!) 53, temperature 98.3 F (36.8 C), temperature source Temporal, resp. rate 16, height 5' 7"$  (1.702 m), weight 212 lb 4.8 oz (96.3 kg), SpO2 95 %.  General: Alert, interactive, in no acute distress. HEENT: PERRLA, TMs pearly gray, turbinates non-edematous without discharge, post-pharynx non  erythematous. Neck: Supple without lymphadenopathy. Lungs: Clear to auscultation without wheezing, rhonchi or rales. {no increased work of breathing. CV: Normal S1, S2 without murmurs. Abdomen: Nondistended, nontender. Skin: Warm and dry, without lesions or rashes. Extremities:  No clubbing, cyanosis or edema. Neuro:   Grossly intact.  Diagnositics/Labs: Spirometry: FEV1: 1.67L 62%, FVC: 2.63L 73% predicted.  This is a very much improved study from previous! FEV1 is nearing normal.    Assessment and plan: Allergic rhinitis Post-nasal drip  - Continue avoidance measures for grasses, trees, indoor molds,  outdoor molds, dust mites, and cockroach. - Continue Xyzal (levocetirizine) 53m tablet once daily as needed.   - Continue Astelin 2 sprays each nostril 1-2 times a day as needed for nasal drainage control.   - You can use an extra dose of the antihistamine, if needed, for breakthrough symptoms.  - Consider nasal saline rinses 1-2 times daily to remove allergens from the nasal cavities as well as help with mucous clearance (this is especially helpful to do before the nasal sprays are given)  Reactive airway - Lung function testing is much improved today! - Daily controller medication(s): Qvar 818m Redihaler 2 puffs twice daily  - Rescue medications: albuterol 2 puffs every 4-6 hours as needed  - Asthma control goals:  * Full participation in all desired activities (may need albuterol before activity) * Albuterol use two time or less a week on average (not counting use with activity) * Cough interfering with sleep two time or less a month * Oral steroids no more than once a year * No hospitalizations  Follow-up 6 months or sooner if needed  I appreciate the opportunity to take part in [name]'s care. Please do not hesitate to contact me with questions.  Sincerely,   ShPrudy FeelerMD Allergy/Immunology [facility] [place]

## 2022-02-26 NOTE — Patient Instructions (Signed)
-   Continue avoidance measures for grasses, trees, indoor molds, outdoor molds, dust mites, and cockroach. - Continue Xyzal (levocetirizine) 5mg  tablet once daily as needed.   - Continue Astelin 2 sprays each nostril 1-2 times a day as needed for nasal drainage control.   - You can use an extra dose of the antihistamine, if needed, for breakthrough symptoms.  - Consider nasal saline rinses 1-2 times daily to remove allergens from the nasal cavities as well as help with mucous clearance (this is especially helpful to do before the nasal sprays are given)  - Lung function testing is much improved today! - Daily controller medication(s): Qvar 72mcg Redihaler 2 puffs twice daily  - Rescue medications: albuterol 2 puffs every 4-6 hours as needed  - Asthma control goals:  * Full participation in all desired activities (may need albuterol before activity) * Albuterol use two time or less a week on average (not counting use with activity) * Cough interfering with sleep two time or less a month * Oral steroids no more than once a year * No hospitalizations  Follow-up 6 months or sooner if needed

## 2022-04-19 ENCOUNTER — Other Ambulatory Visit: Payer: Self-pay | Admitting: Internal Medicine

## 2022-04-19 DIAGNOSIS — E876 Hypokalemia: Secondary | ICD-10-CM

## 2022-05-05 ENCOUNTER — Encounter: Payer: Self-pay | Admitting: Internal Medicine

## 2022-05-05 ENCOUNTER — Ambulatory Visit: Payer: Medicare PPO | Admitting: Internal Medicine

## 2022-05-05 VITALS — BP 180/96 | HR 83 | Temp 97.4°F | Wt 206.9 lb

## 2022-05-05 DIAGNOSIS — E876 Hypokalemia: Secondary | ICD-10-CM

## 2022-05-05 DIAGNOSIS — E538 Deficiency of other specified B group vitamins: Secondary | ICD-10-CM | POA: Diagnosis not present

## 2022-05-05 DIAGNOSIS — I10 Essential (primary) hypertension: Secondary | ICD-10-CM

## 2022-05-05 DIAGNOSIS — I480 Paroxysmal atrial fibrillation: Secondary | ICD-10-CM | POA: Diagnosis not present

## 2022-05-05 DIAGNOSIS — R252 Cramp and spasm: Secondary | ICD-10-CM | POA: Diagnosis not present

## 2022-05-05 LAB — COMPREHENSIVE METABOLIC PANEL
ALT: 26 U/L (ref 0–53)
AST: 24 U/L (ref 0–37)
Albumin: 4.5 g/dL (ref 3.5–5.2)
Alkaline Phosphatase: 55 U/L (ref 39–117)
BUN: 14 mg/dL (ref 6–23)
CO2: 30 mEq/L (ref 19–32)
Calcium: 9.6 mg/dL (ref 8.4–10.5)
Chloride: 99 mEq/L (ref 96–112)
Creatinine, Ser: 0.84 mg/dL (ref 0.40–1.50)
GFR: 84.36 mL/min (ref >60.00)
Glucose, Bld: 87 mg/dL (ref 70–99)
Potassium: 4.2 mEq/L (ref 3.5–5.1)
Sodium: 138 mEq/L (ref 135–145)
Total Bilirubin: 0.7 mg/dL (ref 0.2–1.2)
Total Protein: 7.4 g/dL (ref 6.0–8.3)

## 2022-05-05 LAB — MAGNESIUM: Magnesium: 2 mg/dL (ref 1.5–2.5)

## 2022-05-05 LAB — VITAMIN B12: Vitamin B-12: 484 pg/mL (ref 211–911)

## 2022-05-05 MED ORDER — "BD LUER-LOK SYRINGE 25G X 1"" 3 ML MISC": 3 refills | Status: DC

## 2022-05-05 MED ORDER — METOPROLOL TARTRATE 50 MG PO TABS: 50.0000 mg | ORAL_TABLET | Freq: Two times a day (BID) | ORAL | 1 refills | Status: DC

## 2022-05-05 MED ORDER — RIVAROXABAN 20 MG PO TABS: 20.0000 mg | ORAL_TABLET | Freq: Every day | ORAL | 1 refills | Status: DC

## 2022-05-05 NOTE — Progress Notes (Signed)
Established Patient Office Visit     CC/Reason for Visit: Follow-up chronic conditions, discuss acute concern  HPI: Joel Payne is a 77 y.o. male who is coming in today for the above mentioned reasons. Past Medical History is significant for: Hypertension, hyperlipidemia, A-fib.  His blood pressure remains uncontrolled, he has not been doing ambulatory measurements.  When he went to visit an allergist in February his blood pressure was elevated as well to 160/82.  Currently on hydrochlorothiazide 25 mg, losartan 100 mg and Lopressor 25 mg twice daily.  In addition he has been complaining of severe bilateral, left greater than right, upper leg pain.  Sometimes he has twinges in his lower back, sometimes some numbness.  He describes it as a lightening type pain, sometimes cramping, usually worse at nighttime.   Past Medical/Surgical History: Past Medical History:  Diagnosis Date   ALLERGIC RHINITIS 11/08/2006   BENIGN PROSTATIC HYPERTROPHY 10/12/2006   CORONARY ARTERY DISEASE 10/12/2006   Catheterization was normal 2012, mild nonobstructive coronary disease, normal LV function   HYPERLIPIDEMIA 11/08/2006   HYPERTENSION 10/12/2006   Kidney stone    in 1980   NEPHROLITHIASIS, HX OF 10/12/2006   PPD positive    Shortness of breath    April, 2012  /  catheterization May, 2012 mild nonobstructive coronary disease   Stroke    TIA (transient ischemic attack) 10/2015   Urinary frequency 10/18/2009    Past Surgical History:  Procedure Laterality Date   CARDIAC CATHETERIZATION  2012   non radical prostatectomy  08/2011   PROSTATE BIOPSY  2008   TEE WITHOUT CARDIOVERSION N/A 10/31/2015   Procedure: TRANSESOPHAGEAL ECHOCARDIOGRAM (TEE) will alos have loop;  Surgeon: Wendall Stade, MD;  Location: [facility] ENDOSCOPY;  Service: Cardiovascular;  Laterality: N/A;   TONSILLECTOMY      Social History:  reports that he has never smoked. He has never used smokeless tobacco. He reports that he does  not drink alcohol and does not use drugs.  Allergies: Allergies  Allergen Reactions   Simvastatin Other (See Comments)    Leg cramps    Family History:  Family History  Problem Relation Age of Onset   Colon cancer Neg Hx    Esophageal cancer Neg Hx    Rectal cancer Neg Hx    Stomach cancer Neg Hx      Current Outpatient Medications:    albuterol (VENTOLIN HFA) 108 (90 Base) MCG/ACT inhaler, 2 puffs every 4-6 hours prn, Disp: 8 g, Rfl: 1   azelastine (ASTELIN) 0.1 % nasal spray, Place 2 sprays into both nostrils as directed. 2 sprays  each nostril 1 to 2 times a day as needed for nasal drainage control, Disp: 30 mL, Rfl: 5   beclomethasone (QVAR REDIHALER) 80 MCG/ACT inhaler, Inhale 2 puffs into the lungs 2 (two) times daily., Disp: 1 each, Rfl: 5   cyanocobalamin (VITAMIN B12) 1000 MCG/ML injection, Inject 0.1 mLs (100 mcg total) into the muscle every 30 (thirty) days., Disp: 6 mL, Rfl: 3   fluticasone (FLOVENT HFA) 110 MCG/ACT inhaler, Inhale into the lungs 2 (two) times daily., Disp: , Rfl:    hydrochlorothiazide (HYDRODIURIL) 25 MG tablet, TAKE 1 TABLET BY MOUTH DAILY. SCHEDULE APPT FOR FUTURE REFILLS, Disp: 90 tablet, Rfl: 1   KLOR-CON M20 20 MEQ tablet, TAKE 1 TABLET BY MOUTH EVERY DAY, Disp: 90 tablet, Rfl: 1   levocetirizine (XYZAL) 5 MG tablet, Take 1 tablet (5 mg total) by mouth daily., Disp: 30 tablet,  Rfl: 5   losartan (COZAAR) 100 MG tablet, TAKE 1 TABLET BY MOUTH EVERY DAY, Disp: 90 tablet, Rfl: 1   pravastatin (PRAVACHOL) 20 MG tablet, TAKE 1 TABLET BY MOUTH EVERY DAY, Disp: 90 tablet, Rfl: 1   metoprolol tartrate (LOPRESSOR) 50 MG tablet, Take 1 tablet (50 mg total) by mouth 2 (two) times daily., Disp: 180 tablet, Rfl: 1   rivaroxaban (XARELTO) 20 MG TABS tablet, Take 1 tablet (20 mg total) by mouth daily with supper., Disp: 90 tablet, Rfl: 1   SYRINGE-NEEDLE, DISP, 3 ML (B-D 3CC LUER-LOK SYR 25GX1") 25G X 1" 3 ML MISC, USE FOR B12 INJECTIONS, Disp: 100 each, Rfl:  3  Review of Systems:  Negative unless indicated in HPI.   Physical Exam: Vitals:   05/05/22 1308 05/05/22 1313  BP: (!) 160/84 (!) 180/96  Pulse: 83   Temp: (!) 97.4 F (36.3 C)   TempSrc: Oral   SpO2: 98%   Weight: 206 lb 14.4 oz (93.8 kg)     Body mass index is 32.41 kg/m.   Physical Exam Vitals reviewed.  Constitutional:      Appearance: Normal appearance.  HENT:     Head: Normocephalic and atraumatic.  Eyes:     Conjunctiva/sclera: Conjunctivae normal.     Pupils: Pupils are equal, round, and reactive to light.  Cardiovascular:     Rate and Rhythm: Normal rate and regular rhythm.  Pulmonary:     Effort: Pulmonary effort is normal.     Breath sounds: Normal breath sounds.  Skin:    General: Skin is warm and dry.  Neurological:     General: No focal deficit present.     Mental Status: He is alert and oriented to person, place, and time.  Psychiatric:        Mood and Affect: Mood normal.        Behavior: Behavior normal.        Thought Content: Thought content normal.        Judgment: Judgment normal.      Impression and Plan:  Essential hypertension - Plan: metoprolol tartrate (LOPRESSOR) 50 MG tablet  Vitamin B12 deficiency - Plan: SYRINGE-NEEDLE, DISP, 3 ML (B-D 3CC LUER-LOK SYR 25GX1") 25G X 1" 3 ML MISC, Vitamin B12, Vitamin B12  Hypokalemia - Plan: Magnesium, Comprehensive metabolic panel, Comprehensive metabolic panel, Magnesium  Bilateral leg cramps  Paroxysmal atrial fibrillation - Plan: rivaroxaban (XARELTO) 20 MG TABS tablet  -Blood pressure is uncontrolled.  Increase Lopressor to 50 mg twice daily, continue losartan 100 mg and hydrochlorothiazide 25 mg daily. -Because of his leg pain and cramps I will check electrolytes.  Assuming these are normal we will probably continue with MRI as I am concerned about lumbar nerve impingement/lumbar spinal stenosis.  Time spent:31 minutes reviewing chart, interviewing and examining patient and  formulating plan of care.     Chaya Jan, MD [facility]

## 2022-05-06 ENCOUNTER — Other Ambulatory Visit: Payer: Self-pay | Admitting: Internal Medicine

## 2022-05-06 DIAGNOSIS — R252 Cramp and spasm: Secondary | ICD-10-CM

## 2022-05-08 ENCOUNTER — Ambulatory Visit (HOSPITAL_BASED_OUTPATIENT_CLINIC_OR_DEPARTMENT_OTHER): Payer: Medicare PPO

## 2022-05-15 ENCOUNTER — Ambulatory Visit (HOSPITAL_BASED_OUTPATIENT_CLINIC_OR_DEPARTMENT_OTHER)
Admission: RE | Admit: 2022-05-15 | Discharge: 2022-05-15 | Disposition: A | Discharge status: Home / Self Care | Payer: Medicare PPO | Source: Ambulatory Visit | Attending: Internal Medicine | Admitting: Internal Medicine

## 2022-05-15 DIAGNOSIS — M47816 Spondylosis without myelopathy or radiculopathy, lumbar region: Secondary | ICD-10-CM | POA: Diagnosis not present

## 2022-05-15 DIAGNOSIS — M545 Low back pain, unspecified: Secondary | ICD-10-CM | POA: Diagnosis not present

## 2022-05-15 DIAGNOSIS — M4316 Spondylolisthesis, lumbar region: Secondary | ICD-10-CM | POA: Diagnosis not present

## 2022-05-15 DIAGNOSIS — R252 Cramp and spasm: Secondary | ICD-10-CM | POA: Insufficient documentation

## 2022-05-27 ENCOUNTER — Encounter: Payer: Self-pay | Admitting: Internal Medicine

## 2022-05-27 DIAGNOSIS — I480 Paroxysmal atrial fibrillation: Secondary | ICD-10-CM

## 2022-05-27 MED ORDER — RIVAROXABAN 20 MG PO TABS
20.0000 mg | ORAL_TABLET | Freq: Every day | ORAL | 1 refills | Status: DC
Start: 2022-05-27 — End: 2022-08-24

## 2022-07-21 ENCOUNTER — Other Ambulatory Visit: Payer: Self-pay | Admitting: Internal Medicine

## 2022-07-28 ENCOUNTER — Other Ambulatory Visit: Payer: Self-pay | Admitting: Internal Medicine

## 2022-07-28 DIAGNOSIS — I10 Essential (primary) hypertension: Secondary | ICD-10-CM

## 2022-08-19 ENCOUNTER — Encounter: Payer: Self-pay | Admitting: Internal Medicine

## 2022-08-19 ENCOUNTER — Ambulatory Visit: Payer: Medicare PPO | Admitting: Internal Medicine

## 2022-08-19 VITALS — BP 150/80 | HR 65 | Temp 97.6°F | Ht 66.5 in | Wt 213.5 lb

## 2022-08-19 DIAGNOSIS — I10 Essential (primary) hypertension: Secondary | ICD-10-CM

## 2022-08-19 DIAGNOSIS — G4733 Obstructive sleep apnea (adult) (pediatric): Secondary | ICD-10-CM | POA: Diagnosis not present

## 2022-08-19 MED ORDER — VALSARTAN-HYDROCHLOROTHIAZIDE 160-25 MG PO TABS
1.0000 | ORAL_TABLET | Freq: Every day | ORAL | 1 refills | Status: DC
Start: 2022-08-19 — End: 2023-01-25

## 2022-08-19 NOTE — Progress Notes (Signed)
Established Patient Office Visit     CC/Reason for Visit: Blood pressure follow-up, discuss additional concerns  HPI: Joel Payne is a 77 y.o. male who is coming in today for the above mentioned reasons. Past Medical History is significant for: Hypertension, hyperlipidemia, history of A-fib.  He is here today with his wife who has concerns.  Per report he has been having to take a nap every couple hours during the day, does not sleep well at night and snores so loudly that she has moved to a different bedroom to sleep in.  At last visit metoprolol was increased to 50 mg twice daily.  Blood pressure still not well-controlled.   Past Medical/Surgical History: Past Medical History:  Diagnosis Date   ALLERGIC RHINITIS 11/08/2006   BENIGN PROSTATIC HYPERTROPHY 10/12/2006   CORONARY ARTERY DISEASE 10/12/2006   Catheterization was normal 2012, mild nonobstructive coronary disease, normal LV function   HYPERLIPIDEMIA 11/08/2006   HYPERTENSION 10/12/2006   Kidney stone    in 1980   NEPHROLITHIASIS, HX OF 10/12/2006   PPD positive    Shortness of breath    April, 2012  /  catheterization May, 2012 mild nonobstructive coronary disease   Stroke [facility])    TIA (transient ischemic attack) 10/2015   Urinary frequency 10/18/2009    Past Surgical History:  Procedure Laterality Date   CARDIAC CATHETERIZATION  2012   non radical prostatectomy  08/2011   PROSTATE BIOPSY  2008   TEE WITHOUT CARDIOVERSION N/A 10/31/2015   Procedure: TRANSESOPHAGEAL ECHOCARDIOGRAM (TEE) will alos have loop;  Surgeon: Wendall Stade, MD;  Location: [facility] ENDOSCOPY;  Service: Cardiovascular;  Laterality: N/A;   TONSILLECTOMY      Social History:  reports that he has never smoked. He has never used smokeless tobacco. He reports that he does not drink alcohol and does not use drugs.  Allergies: Allergies  Allergen Reactions   Simvastatin Other (See Comments)    Leg cramps    Family History:  Family History   Problem Relation Age of Onset   Colon cancer Neg Hx    Esophageal cancer Neg Hx    Rectal cancer Neg Hx    Stomach cancer Neg Hx      Current Outpatient Medications:    albuterol (VENTOLIN HFA) 108 (90 Base) MCG/ACT inhaler, 2 puffs every 4-6 hours prn, Disp: 8 g, Rfl: 1   apixaban (ELIQUIS) 5 MG TABS tablet, Take by mouth., Disp: , Rfl:    azelastine (ASTELIN) 0.1 % nasal spray, Place 2 sprays into both nostrils as directed. 2 sprays  each nostril 1 to 2 times a day as needed for nasal drainage control, Disp: 30 mL, Rfl: 5   beclomethasone (QVAR REDIHALER) 80 MCG/ACT inhaler, Inhale 2 puffs into the lungs 2 (two) times daily., Disp: 1 each, Rfl: 5   cyanocobalamin (VITAMIN B12) 1000 MCG/ML injection, Inject 0.1 mLs (100 mcg total) into the muscle every 30 (thirty) days., Disp: 6 mL, Rfl: 3   fluticasone (FLOVENT HFA) 110 MCG/ACT inhaler, Inhale into the lungs 2 (two) times daily., Disp: , Rfl:    KLOR-CON M20 20 MEQ tablet, TAKE 1 TABLET BY MOUTH EVERY DAY, Disp: 90 tablet, Rfl: 1   levocetirizine (XYZAL) 5 MG tablet, Take 1 tablet (5 mg total) by mouth daily., Disp: 30 tablet, Rfl: 5   metoprolol tartrate (LOPRESSOR) 50 MG tablet, Take 1 tablet (50 mg total) by mouth 2 (two) times daily., Disp: 180 tablet, Rfl: 1  pravastatin (PRAVACHOL) 20 MG tablet, TAKE 1 TABLET BY MOUTH EVERY DAY, Disp: 90 tablet, Rfl: 1   rivaroxaban (XARELTO) 20 MG TABS tablet, Take 1 tablet (20 mg total) by mouth daily with supper., Disp: 30 tablet, Rfl: 1   SYRINGE-NEEDLE, DISP, 3 ML (B-D 3CC LUER-LOK SYR 25GX1") 25G X 1" 3 ML MISC, USE FOR B12 INJECTIONS, Disp: 100 each, Rfl: 3   valsartan-hydrochlorothiazide (DIOVAN-HCT) 160-25 MG tablet, Take 1 tablet by mouth daily., Disp: 90 tablet, Rfl: 1  Review of Systems:  Negative unless indicated in HPI.   Physical Exam: Vitals:   08/19/22 1301 08/19/22 1302  BP: (!) 160/80 (!) 150/80  Pulse: 65   Temp: 97.6 F (36.4 C)   TempSrc: Oral   SpO2: 96%   Weight:  213 lb 8 oz (96.8 kg)   Height: 5' 6.5" (1.689 m)     Body mass index is 33.94 kg/m.   Physical Exam Vitals reviewed.  Constitutional:      Appearance: Normal appearance.  HENT:     Head: Normocephalic and atraumatic.  Eyes:     Conjunctiva/sclera: Conjunctivae normal.     Pupils: Pupils are equal, round, and reactive to light.  Skin:    General: Skin is warm and dry.  Neurological:     General: No focal deficit present.     Mental Status: He is alert and oriented to person, place, and time.  Psychiatric:        Mood and Affect: Mood normal.        Behavior: Behavior normal.        Thought Content: Thought content normal.        Judgment: Judgment normal.      Impression and Plan:  Essential hypertension Assessment & Plan: Not well-controlled, discontinue hydrochlorothiazide and losartan and instead start Diovan HCT 160/25 mg.  Continue metoprolol 50 mg twice daily.  We have discussed lifestyle changes.  He will do ambulatory blood pressure monitoring and return in 6 to 8 weeks for follow-up.  Orders: -     Valsartan-hydroCHLOROthiazide; Take 1 tablet by mouth daily.  Dispense: 90 tablet; Refill: 1  OSA (obstructive sleep apnea) Assessment & Plan: Probable diagnosis based on symptoms, referred to [facility] neurology.  Orders: -     Ambulatory referral to Neurology     Time spent:32 minutes reviewing chart, interviewing and examining patient and formulating plan of care.     Chaya Jan, MD [facility] at [facility]

## 2022-08-19 NOTE — Assessment & Plan Note (Signed)
Not well-controlled, discontinue hydrochlorothiazide and losartan and instead start Diovan HCT 160/25 mg.  Continue metoprolol 50 mg twice daily.  We have discussed lifestyle changes.  He will do ambulatory blood pressure monitoring and return in 6 to 8 weeks for follow-up.

## 2022-08-19 NOTE — Assessment & Plan Note (Signed)
Probable diagnosis based on symptoms, referred to [facility] neurology.

## 2022-08-20 ENCOUNTER — Other Ambulatory Visit: Payer: Self-pay | Admitting: Internal Medicine

## 2022-08-22 ENCOUNTER — Encounter: Payer: Self-pay | Admitting: Internal Medicine

## 2022-08-22 DIAGNOSIS — I480 Paroxysmal atrial fibrillation: Secondary | ICD-10-CM

## 2022-08-24 ENCOUNTER — Other Ambulatory Visit: Payer: Self-pay | Admitting: Allergy

## 2022-08-24 MED ORDER — RIVAROXABAN 20 MG PO TABS: 20.0000 mg | ORAL_TABLET | Freq: Every day | ORAL | 1 refills | Status: DC

## 2022-08-25 ENCOUNTER — Other Ambulatory Visit (HOSPITAL_COMMUNITY): Payer: Self-pay

## 2022-08-25 ENCOUNTER — Encounter: Payer: Self-pay | Admitting: Pharmacist

## 2022-08-25 ENCOUNTER — Other Ambulatory Visit: Payer: Self-pay

## 2022-08-25 MED ORDER — FLUTICASONE PROPIONATE HFA 110 MCG/ACT IN AERO
2.0000 | INHALATION_SPRAY | Freq: Two times a day (BID) | RESPIRATORY_TRACT | 2 refills | Status: DC
  Filled 2022-08-25 (×2): qty 12, 30d supply, fill #0
  Filled 2022-09-25: qty 12, 30d supply, fill #1

## 2022-08-27 ENCOUNTER — Ambulatory Visit: Payer: Medicare PPO | Admitting: Allergy

## 2022-08-27 ENCOUNTER — Encounter (INDEPENDENT_AMBULATORY_CARE_PROVIDER_SITE_OTHER): Payer: Self-pay

## 2022-09-24 ENCOUNTER — Institutional Professional Consult (permissible substitution): Payer: Medicare PPO | Admitting: Neurology

## 2022-09-25 ENCOUNTER — Telehealth: Payer: Medicare PPO | Admitting: Physician Assistant

## 2022-09-25 ENCOUNTER — Other Ambulatory Visit: Payer: Self-pay | Admitting: Internal Medicine

## 2022-09-25 ENCOUNTER — Encounter: Payer: Self-pay | Admitting: Internal Medicine

## 2022-09-25 DIAGNOSIS — U071 COVID-19: Secondary | ICD-10-CM | POA: Diagnosis not present

## 2022-09-25 DIAGNOSIS — I10 Essential (primary) hypertension: Secondary | ICD-10-CM

## 2022-09-25 MED ORDER — MOLNUPIRAVIR EUA 200MG CAPSULE
4.0000 | ORAL_CAPSULE | Freq: Two times a day (BID) | ORAL | 0 refills | Status: AC
Start: 2022-09-25 — End: 2022-09-30

## 2022-09-25 NOTE — Patient Instructions (Signed)
Joel Payne, thank you for joining Margaretann Loveless, PA-C for today's virtual visit.  While this provider is not your primary care provider (PCP), if your PCP is located in our provider database this encounter information will be shared with them immediately following your visit.   A [facility] MyChart account gives you access to today's visit and all your visits, tests, and labs performed at [facility] " click here if you don't have a [facility] MyChart account or go to [email or web address]  Consent: (Patient) Joel Payne provided verbal consent for this virtual visit at the beginning of the encounter.  Current Medications:  Current Outpatient Medications:    molnupiravir EUA (LAGEVRIO) 200 mg CAPS capsule, Take 4 capsules (800 mg total) by mouth 2 (two) times daily for 5 days., Disp: 40 capsule, Rfl: 0   albuterol (VENTOLIN HFA) 108 (90 Base) MCG/ACT inhaler, INHALE 2 PUFFS BY MOUTH EVERY 4 TO 6 HOURS AS NEEDED, Disp: 8.5 each, Rfl: 1   apixaban (ELIQUIS) 5 MG TABS tablet, Take by mouth., Disp: , Rfl:    azelastine (ASTELIN) 0.1 % nasal spray, Place 2 sprays into both nostrils as directed. 2 sprays  each nostril 1 to 2 times a day as needed for nasal drainage control, Disp: 30 mL, Rfl: 5   beclomethasone (QVAR REDIHALER) 80 MCG/ACT inhaler, Inhale 2 puffs into the lungs 2 (two) times daily., Disp: 1 each, Rfl: 5   cyanocobalamin (VITAMIN B12) 1000 MCG/ML injection, Inject 0.1 mLs (100 mcg total) into the muscle every 30 (thirty) days., Disp: 6 mL, Rfl: 3   fluticasone (FLOVENT HFA) 110 MCG/ACT inhaler, Inhale 2 puffs into the lungs 2 (two) times daily., Disp: 12 g, Rfl: 2   KLOR-CON M20 20 MEQ tablet, TAKE 1 TABLET BY MOUTH EVERY DAY, Disp: 90 tablet, Rfl: 1   levocetirizine (XYZAL) 5 MG tablet, Take 1 tablet (5 mg total) by mouth daily., Disp: 30 tablet, Rfl: 5   metoprolol tartrate (LOPRESSOR) 50 MG tablet, Take 1 tablet (50 mg total) by mouth 2 (two) times  daily., Disp: 180 tablet, Rfl: 1   pravastatin (PRAVACHOL) 20 MG tablet, TAKE 1 TABLET BY MOUTH EVERY DAY, Disp: 90 tablet, Rfl: 1   rivaroxaban (XARELTO) 20 MG TABS tablet, Take 1 tablet (20 mg total) by mouth daily with supper., Disp: 30 tablet, Rfl: 1   SYRINGE-NEEDLE, DISP, 3 ML (B-D 3CC LUER-LOK SYR 25GX1") 25G X 1" 3 ML MISC, USE FOR B12 INJECTIONS, Disp: 100 each, Rfl: 3   valsartan-hydrochlorothiazide (DIOVAN-HCT) 160-25 MG tablet, Take 1 tablet by mouth daily., Disp: 90 tablet, Rfl: 1   Medications ordered in this encounter:  Meds ordered this encounter  Medications   molnupiravir EUA (LAGEVRIO) 200 mg CAPS capsule    Sig: Take 4 capsules (800 mg total) by mouth 2 (two) times daily for 5 days.    Dispense:  40 capsule    Refill:  0    Patient on Xarelto    Order Specific Question:   Supervising Provider    Answer:   Merrilee Jansky [[id number]]     *If you need refills on other medications prior to your next appointment, please contact your pharmacy*  Follow-Up: Call back or seek an in-person evaluation if the symptoms worsen or if the condition fails to improve as anticipated.   Virtual Care [phone]  Care Instructions: Can take to lessen severity: Vit C 500mg  twice daily Quercertin 250-500mg  twice daily Zinc 75-100mg  daily Melatonin 3-6  mg at bedtime Vit D3 1000-2000 IU daily Aspirin 81 mg daily with food Optional: Famotidine 20mg  daily Also can add tylenol/ibuprofen as needed for fevers and body aches May add Mucinex or Mucinex DM as needed for cough/congestion    Isolation Instructions: You are to isolate at home until you have been fever free for at least 24 hours without a fever-reducing medication, and symptoms have been steadily improving for 24 hours. At that time,  you can end isolation but need to mask for an additional 5 days.   If you must be around other household members who do not have symptoms, you need to make sure that both you and  the family members are masking consistently with a high-quality mask.  If you note any worsening of symptoms despite treatment, please seek an in-person evaluation ASAP. If you note any significant shortness of breath or any chest pain, please seek ER evaluation. Please do not delay care!   COVID-19: What to Do if You Are Sick If you test positive and are an older adult or someone who is at high risk of getting very sick from COVID-19, treatment may be available. Contact a healthcare provider right away after a positive test to determine if you are eligible, even if your symptoms are mild right now. You can also visit a Test to Treat location and, if eligible, receive a prescription from a provider. Don't delay: Treatment must be started within the first few days to be effective. If you have a fever, cough, or other symptoms, you might have COVID-19. Most people have mild illness and are able to recover at home. If you are sick: Keep track of your symptoms. If you have an emergency warning sign (including trouble breathing), call 911. Steps to help prevent the spread of COVID-19 if you are sick If you are sick with COVID-19 or think you might have COVID-19, follow the steps below to care for yourself and to help protect other people in your home and community. Stay home except to get medical care Stay home. Most people with COVID-19 have mild illness and can recover at home without medical care. Do not leave your home, except to get medical care. Do not visit public areas and do not go to places where you are unable to wear a mask. Take care of yourself. Get rest and stay hydrated. Take over-the-counter medicines, such as acetaminophen, to help you feel better. Stay in touch with your doctor. Call before you get medical care. Be sure to get care if you have trouble breathing, or have any other emergency warning signs, or if you think it is an emergency. Avoid public transportation, ride-sharing, or  taxis if possible. Get tested If you have symptoms of COVID-19, get tested. While waiting for test results, stay away from others, including staying apart from those living in your household. Get tested as soon as possible after your symptoms start. Treatments may be available for people with COVID-19 who are at risk for becoming very sick. Don't delay: Treatment must be started early to be effective--some treatments must begin within 5 days of your first symptoms. Contact your healthcare provider right away if your test result is positive to determine if you are eligible. Self-tests are one of several options for testing for the virus that causes COVID-19 and may be more convenient than laboratory-based tests and point-of-care tests. Ask your healthcare provider or your local health department if you need help interpreting your test results. You can visit  your state, tribal, local, and territorial health department's website to look for the latest local information on testing sites. Separate yourself from other people As much as possible, stay in a specific room and away from other people and pets in your home. If possible, you should use a separate bathroom. If you need to be around other people or animals in or outside of the home, wear a well-fitting mask. Tell your close contacts that they may have been exposed to COVID-19. An infected person can spread COVID-19 starting 48 hours (or 2 days) before the person has any symptoms or tests positive. By letting your close contacts know they may have been exposed to COVID-19, you are helping to protect everyone. See COVID-19 and Animals if you have questions about pets. If you are diagnosed with COVID-19, someone from the health department may call you. Answer the call to slow the spread. Monitor your symptoms Symptoms of COVID-19 include fever, cough, or other symptoms. Follow care instructions from your healthcare provider and local health department.  Your local health authorities may give instructions on checking your symptoms and reporting information. When to seek emergency medical attention Look for emergency warning signs* for COVID-19. If someone is showing any of these signs, seek emergency medical care immediately: Trouble breathing Persistent pain or pressure in the chest New confusion Inability to wake or stay awake Pale, gray, or blue-colored skin, lips, or nail beds, depending on skin tone *This list is not all possible symptoms. Please call your medical provider for any other symptoms that are severe or concerning to you. Call 911 or call ahead to your local emergency facility: Notify the operator that you are seeking care for someone who has or may have COVID-19. Call ahead before visiting your doctor Call ahead. Many medical visits for routine care are being postponed or done by phone or telemedicine. If you have a medical appointment that cannot be postponed, call your doctor's office, and tell them you have or may have COVID-19. This will help the office protect themselves and other patients. If you are sick, wear a well-fitting mask You should wear a mask if you must be around other people or animals, including pets (even at home). Wear a mask with the best fit, protection, and comfort for you. You don't need to wear the mask if you are alone. If you can't put on a mask (because of trouble breathing, for example), cover your coughs and sneezes in some other way. Try to stay at least 6 feet away from other people. This will help protect the people around you. Masks should not be placed on young children under age 48 years, anyone who has trouble breathing, or anyone who is not able to remove the mask without help. Cover your coughs and sneezes Cover your mouth and nose with a tissue when you cough or sneeze. Throw away used tissues in a lined trash can. Immediately wash your hands with soap and water for at least 20 seconds.  If soap and water are not available, clean your hands with an alcohol-based hand sanitizer that contains at least 60% alcohol. Clean your hands often Wash your hands often with soap and water for at least 20 seconds. This is especially important after blowing your nose, coughing, or sneezing; going to the bathroom; and before eating or preparing food. Use hand sanitizer if soap and water are not available. Use an alcohol-based hand sanitizer with at least 60% alcohol, covering all surfaces of your hands and  rubbing them together until they feel dry. Soap and water are the best option, especially if hands are visibly dirty. Avoid touching your eyes, nose, and mouth with unwashed hands. Handwashing Tips Avoid sharing personal household items Do not share dishes, drinking glasses, cups, eating utensils, towels, or bedding with other people in your home. Wash these items thoroughly after using them with soap and water or put in the dishwasher. Clean surfaces in your home regularly Clean and disinfect high-touch surfaces (for example, doorknobs, tables, handles, light switches, and countertops) in your "sick room" and bathroom. In shared spaces, you should clean and disinfect surfaces and items after each use by the person who is ill. If you are sick and cannot clean, a caregiver or other person should only clean and disinfect the area around you (such as your bedroom and bathroom) on an as needed basis. Your caregiver/other person should wait as long as possible (at least several hours) and wear a mask before entering, cleaning, and disinfecting shared spaces that you use. Clean and disinfect areas that may have blood, stool, or body fluids on them. Use household cleaners and disinfectants. Clean visible dirty surfaces with household cleaners containing soap or detergent. Then, use a household disinfectant. Use a product from [employer] List N: Disinfectants for Coronavirus (COVID-19). Be sure to follow the  instructions on the label to ensure safe and effective use of the product. Many products recommend keeping the surface wet with a disinfectant for a certain period of time (look at "contact time" on the product label). You may also need to wear personal protective equipment, such as gloves, depending on the directions on the product label. Immediately after disinfecting, wash your hands with soap and water for 20 seconds. For completed guidance on cleaning and disinfecting your home, visit Complete Disinfection Guidance. Take steps to improve ventilation at home Improve ventilation (air flow) at home to help prevent from spreading COVID-19 to other people in your household. Clear out COVID-19 virus particles in the air by opening windows, using air filters, and turning on fans in your home. Use this interactive tool to learn how to improve air flow in your home. When you can be around others after being sick with COVID-19 Deciding when you can be around others is different for different situations. Find out when you can safely end home isolation. For any additional questions about your care, contact your healthcare provider or state or local health department. 04/02/2020 Content source: [facility] [place] for Immunization and Respiratory Diseases ([facility]), Division of Viral Diseases This information is not intended to replace advice given to you by your health care provider. Make sure you discuss any questions you have with your health care provider. Document Revised: 05/16/2020 Document Reviewed: 05/16/2020 Elsevier Patient Education  2022 [employer].     If you have been instructed to have an in-person evaluation today at a local Urgent Care facility, please use the link below. It will take you to a list of all of our available [facility], including address, phone number and hours of operation. Please do not delay care.  [facility]  If you or a family member do not  have a primary care provider, use the link below to schedule a visit and establish care. When you choose a [facility] primary care physician or advanced practice provider, you gain a long-term partner in health. Find a Primary Care Provider  Learn more about [facility]'s in-office and virtual care options: [facility] - Get Care Now

## 2022-09-25 NOTE — Progress Notes (Signed)
Virtual Visit Consent   [name], you are scheduled for a virtual visit with a [facility] provider today. Just as with appointments in the office, your consent must be obtained to participate. Your consent will be active for this visit and any virtual visit you may have with one of our providers in the next 365 days. If you have a MyChart account, a copy of this consent can be sent to you electronically.  As this is a virtual visit, video technology does not allow for your provider to perform a traditional examination. This may limit your provider's ability to fully assess your condition. If your provider identifies any concerns that need to be evaluated in person or the need to arrange testing (such as labs, EKG, etc.), we will make arrangements to do so. Although advances in technology are sophisticated, we cannot ensure that it will always work on either your end or our end. If the connection with a video visit is poor, the visit may have to be switched to a telephone visit. With either a video or telephone visit, we are not always able to ensure that we have a secure connection.  By engaging in this virtual visit, you consent to the provision of healthcare and authorize for your insurance to be billed (if applicable) for the services provided during this visit. Depending on your insurance coverage, you may receive a charge related to this service.  I need to obtain your verbal consent now. Are you willing to proceed with your visit today? Joel Payne has provided verbal consent on 09/25/2022 for a virtual visit (video or telephone). Margaretann Loveless, PA-C  Date: 09/25/2022 5:25 PM  Virtual Visit via Video Note   I, Margaretann Loveless, connected with  Joel Payne  ([id number], [birth year]) on 09/25/22 at  5:15 PM EDT by a video-enabled telemedicine application and verified that I am speaking with the correct person using two identifiers.  Location: Patient: Virtual Visit Location  Patient: Home Provider: Virtual Visit Location Provider: Home Office   I discussed the limitations of evaluation and management by telemedicine and the availability of in person appointments. The patient expressed understanding and agreed to proceed.    History of Present Illness: Joel Payne is a 77 y.o. who identifies as a male who was assigned male at birth, and is being seen today for Covid 86.  HPI: URI  This is a new problem. The current episode started yesterday (Symptoms started yesterday). The problem has been gradually worsening. The maximum temperature recorded prior to his arrival was 101 - 101.9 F (101). The fever has been present for Less than 1 day. Associated symptoms include congestion, coughing, headaches, sinus pain and a sore throat. Pertinent negatives include no diarrhea, ear pain, nausea, plugged ear sensation, rhinorrhea, vomiting or wheezing. Associated symptoms comments: Body aches. He has tried acetaminophen and NSAIDs for the symptoms. The treatment provided no relief.     Problems:  Patient Active Problem List   Diagnosis Date Noted   OSA (obstructive sleep apnea) 08/19/2022   Vitamin D deficiency 10/06/2019   Vitamin B12 deficiency 08/23/2018   Chronic anticoagulation 11/26/2017   Atrial fibrillation (HCC)    Hemianopsia 10/28/2015   TIA (transient ischemic attack) 10/28/2015   Acute CVA (cerebrovascular accident) (HCC) 10/28/2015   Hematuria    Fever 09/08/2011   UTI (lower urinary tract infection) 09/08/2011   Hypokalemia 09/08/2011   Anemia 09/08/2011   Leukocytosis 09/08/2011   Elevated PSA 04/06/2011  Shortness of breath    URINARY FREQUENCY 10/18/2009   Dyslipidemia 11/08/2006   Allergic rhinitis 11/08/2006   Essential hypertension 10/12/2006   BENIGN PROSTATIC HYPERTROPHY- with elevated PSA- preadmit foley 10/12/2006   NEPHROLITHIASIS, HX OF 10/12/2006   Coronary atherosclerosis 10/12/2006    Allergies:  Allergies  Allergen Reactions    Simvastatin Other (See Comments)    Leg cramps   Medications:  Current Outpatient Medications:    molnupiravir EUA (LAGEVRIO) 200 mg CAPS capsule, Take 4 capsules (800 mg total) by mouth 2 (two) times daily for 5 days., Disp: 40 capsule, Rfl: 0   albuterol (VENTOLIN HFA) 108 (90 Base) MCG/ACT inhaler, INHALE 2 PUFFS BY MOUTH EVERY 4 TO 6 HOURS AS NEEDED, Disp: 8.5 each, Rfl: 1   apixaban (ELIQUIS) 5 MG TABS tablet, Take by mouth., Disp: , Rfl:    azelastine (ASTELIN) 0.1 % nasal spray, Place 2 sprays into both nostrils as directed. 2 sprays  each nostril 1 to 2 times a day as needed for nasal drainage control, Disp: 30 mL, Rfl: 5   beclomethasone (QVAR REDIHALER) 80 MCG/ACT inhaler, Inhale 2 puffs into the lungs 2 (two) times daily., Disp: 1 each, Rfl: 5   cyanocobalamin (VITAMIN B12) 1000 MCG/ML injection, Inject 0.1 mLs (100 mcg total) into the muscle every 30 (thirty) days., Disp: 6 mL, Rfl: 3   fluticasone (FLOVENT HFA) 110 MCG/ACT inhaler, Inhale 2 puffs into the lungs 2 (two) times daily., Disp: 12 g, Rfl: 2   KLOR-CON M20 20 MEQ tablet, TAKE 1 TABLET BY MOUTH EVERY DAY, Disp: 90 tablet, Rfl: 1   levocetirizine (XYZAL) 5 MG tablet, Take 1 tablet (5 mg total) by mouth daily., Disp: 30 tablet, Rfl: 5   metoprolol tartrate (LOPRESSOR) 50 MG tablet, Take 1 tablet (50 mg total) by mouth 2 (two) times daily., Disp: 180 tablet, Rfl: 1   pravastatin (PRAVACHOL) 20 MG tablet, TAKE 1 TABLET BY MOUTH EVERY DAY, Disp: 90 tablet, Rfl: 1   rivaroxaban (XARELTO) 20 MG TABS tablet, Take 1 tablet (20 mg total) by mouth daily with supper., Disp: 30 tablet, Rfl: 1   SYRINGE-NEEDLE, DISP, 3 ML (B-D 3CC LUER-LOK SYR 25GX1") 25G X 1" 3 ML MISC, USE FOR B12 INJECTIONS, Disp: 100 each, Rfl: 3   valsartan-hydrochlorothiazide (DIOVAN-HCT) 160-25 MG tablet, Take 1 tablet by mouth daily., Disp: 90 tablet, Rfl: 1  Observations/Objective: Patient is well-developed, well-nourished in no acute distress.  Resting  comfortably at home.  Head is normocephalic, atraumatic.  No labored breathing.  Speech is clear and coherent with logical content.  Patient is alert and oriented at baseline.    Assessment and Plan: 1. COVID-19 - molnupiravir EUA (LAGEVRIO) 200 mg CAPS capsule; Take 4 capsules (800 mg total) by mouth 2 (two) times daily for 5 days.  Dispense: 40 capsule; Refill: 0  - Continue OTC symptomatic management of choice - Will send OTC vitamins and supplement information through AVS - Molnupiravir prescribed, patient on Xarelto - Patient enrolled in MyChart symptom monitoring - Push fluids - Rest as needed - Discussed return precautions and when to seek in-person evaluation, sent via AVS as well   Follow Up Instructions: I discussed the assessment and treatment plan with the patient. The patient was provided an opportunity to ask questions and all were answered. The patient agreed with the plan and demonstrated an understanding of the instructions.  A copy of instructions were sent to the patient via MyChart unless otherwise noted below.    The  patient was advised to call back or seek an in-person evaluation if the symptoms worsen or if the condition fails to improve as anticipated.  Time:  I spent 10 minutes with the patient via telehealth technology discussing the above problems/concerns.    Margaretann Loveless, PA-C

## 2022-09-26 ENCOUNTER — Encounter: Payer: Self-pay | Admitting: Internal Medicine

## 2022-09-26 DIAGNOSIS — I480 Paroxysmal atrial fibrillation: Secondary | ICD-10-CM

## 2022-09-28 MED ORDER — RIVAROXABAN 20 MG PO TABS
20.0000 mg | ORAL_TABLET | Freq: Every day | ORAL | 1 refills | Status: DC
Start: 2022-09-28 — End: 2022-10-28

## 2022-09-29 ENCOUNTER — Other Ambulatory Visit (HOSPITAL_COMMUNITY): Payer: Self-pay

## 2022-09-30 ENCOUNTER — Ambulatory Visit: Payer: Medicare PPO | Admitting: Internal Medicine

## 2022-10-05 ENCOUNTER — Encounter: Payer: Self-pay | Admitting: Internal Medicine

## 2022-10-07 ENCOUNTER — Encounter: Payer: Self-pay | Admitting: Allergy

## 2022-10-07 ENCOUNTER — Ambulatory Visit: Payer: Medicare PPO | Admitting: Allergy

## 2022-10-07 VITALS — BP 128/64 | HR 60 | Temp 98.0°F | Resp 16

## 2022-10-07 DIAGNOSIS — R0982 Postnasal drip: Secondary | ICD-10-CM

## 2022-10-07 DIAGNOSIS — J4520 Mild intermittent asthma, uncomplicated: Secondary | ICD-10-CM | POA: Diagnosis not present

## 2022-10-07 DIAGNOSIS — J309 Allergic rhinitis, unspecified: Secondary | ICD-10-CM | POA: Diagnosis not present

## 2022-10-07 MED ORDER — FLUTICASONE PROPIONATE HFA 110 MCG/ACT IN AERO: 2.0000 | INHALATION_SPRAY | Freq: Two times a day (BID) | RESPIRATORY_TRACT | 2 refills | Status: DC

## 2022-10-07 MED ORDER — IPRATROPIUM BROMIDE 0.06 % NA SOLN: 2.0000 | Freq: Four times a day (QID) | NASAL | 5 refills | Status: DC | PRN

## 2022-10-07 MED ORDER — LEVOCETIRIZINE DIHYDROCHLORIDE 5 MG PO TABS: 5.0000 mg | ORAL_TABLET | Freq: Every day | ORAL | 5 refills | Status: DC

## 2022-10-07 NOTE — Progress Notes (Signed)
Follow-up Note  RE: Joel Payne MRN: [id number] DOB: 05/30/1945 Date of Office Visit: 10/07/2022   History of present illness: Joel Payne is a 77 y.o. male presenting today for follow-up of allergic rhinitis with PND and mild intermittent asthma. He was last seen in the office on 02/26/22 by myself.    He states he has been doing well since last visit.  He has noted over the past 1.5 months that he has had more raspy throat and nasal drip that is a bit bothersome.  He does use the astelin twice a day (but sometimes forgets the AM dose) and not really seeing much benefit in symptoms with use.  He does continue on Xyzal in evenings and this does seem to help some.  He states his breathing has been doing well and has not had any symptoms to warrant albuterol use.  He has been taking Flovent (Qvar was not covered/preferred) 2 puffs twice a day and this has been controlling symptoms well.  Has not had any ED/UC visits or systemic steroid needs.  He states about 2 weeks or so ago he had a mild bout with Covid and did not have any impact with his breathing fortunately.   Review of systems: 10pt ROS negative unless noted above in HPI  Past medical/social/surgical/family history have been reviewed and are unchanged unless specifically indicated below.  No changes  Medication List: Current Outpatient Medications  Medication Sig Dispense Refill   albuterol (VENTOLIN HFA) 108 (90 Base) MCG/ACT inhaler INHALE 2 PUFFS BY MOUTH EVERY 4 TO 6 HOURS AS NEEDED 8.5 each 1   azelastine (ASTELIN) 0.1 % nasal spray Place 2 sprays into both nostrils as directed. 2 sprays  each nostril 1 to 2 times a day as needed for nasal drainage control 30 mL 5   cyanocobalamin (VITAMIN B12) 1000 MCG/ML injection Inject 0.1 mLs (100 mcg total) into the muscle every 30 (thirty) days. 6 mL 3   ipratropium (ATROVENT) 0.06 % nasal spray Place 2 sprays into both nostrils 4 (four) times daily as needed (nasal drainage). 15  mL 5   KLOR-CON M20 20 MEQ tablet TAKE 1 TABLET BY MOUTH EVERY DAY 90 tablet 1   metoprolol tartrate (LOPRESSOR) 50 MG tablet TAKE 1 TABLET BY MOUTH TWICE A DAY 180 tablet 1   pravastatin (PRAVACHOL) 20 MG tablet TAKE 1 TABLET BY MOUTH EVERY DAY 90 tablet 1   rivaroxaban (XARELTO) 20 MG TABS tablet Take 1 tablet (20 mg total) by mouth daily with supper. 90 tablet 1   SYRINGE-NEEDLE, DISP, 3 ML (B-D 3CC LUER-LOK SYR 25GX1") 25G X 1" 3 ML MISC USE FOR B12 INJECTIONS 100 each 3   valsartan-hydrochlorothiazide (DIOVAN-HCT) 160-25 MG tablet Take 1 tablet by mouth daily. 90 tablet 1   apixaban (ELIQUIS) 5 MG TABS tablet Take by mouth.     fluticasone (FLOVENT HFA) 110 MCG/ACT inhaler Inhale 2 puffs into the lungs 2 (two) times daily. 12 g 2   levocetirizine (XYZAL) 5 MG tablet Take 1 tablet (5 mg total) by mouth daily. 30 tablet 5   No current facility-administered medications for this visit.     Known medication allergies: Allergies  Allergen Reactions   Simvastatin Other (See Comments)    Leg cramps     Physical examination: Blood pressure 128/64, pulse 60, temperature 98 F (36.7 C), temperature source Temporal, resp. rate 16, SpO2 97%.  General: Alert, interactive, in no acute distress. HEENT: PERRLA, TMs pearly gray, turbinates  non-edematous with clear discharge, post-pharynx non erythematous. Neck: Supple without lymphadenopathy. Lungs: Clear to auscultation without wheezing, rhonchi or rales. {no increased work of breathing. CV: Normal S1, S2 without murmurs. Abdomen: Nondistended, nontender. Skin: Warm and dry, without lesions or rashes. Extremities:  No clubbing, cyanosis or edema. Neuro:   Grossly intact.  Diagnositics/Labs: None today  Assessment and plan: Allergic rhinitis Post nasal drip  - Continue avoidance measures for grasses, trees, indoor molds, outdoor molds, dust mites, and cockroach. - Continue Xyzal (levocetirizine) 5mg  tablet once daily as needed.   -  Change Astelin to Atrovent nasal spray 2 sprays each nostril up to 3-4 times a day as needed for nasal drainage/drip control - You can use an extra dose of the antihistamine, if needed, for breakthrough symptoms.  - Consider nasal saline rinses 1-2 times daily to remove allergens from the nasal cavities as well as help with mucous clearance (this is especially helpful to do before the nasal sprays are given)  Reactive airway, mild intermittent - Daily controller medication(s): Fluticasone (Flovent) 2 puffs twice daily.  Rinse mouth after use. - Rescue medications: albuterol 2 puffs every 4-6 hours as needed  - Asthma control goals:  * Full participation in all desired activities (may need albuterol before activity) * Albuterol use two time or less a week on average (not counting use with activity) * Cough interfering with sleep two time or less a month * Oral steroids no more than once a year * No hospitalizations  Follow-up 6 months or sooner if needed  I appreciate the opportunity to take part in Joel Payne's care. Please do not hesitate to contact me with questions.  Sincerely,   Margo Aye, MD Allergy/Immunology [facility]

## 2022-10-07 NOTE — Patient Instructions (Addendum)
-   Continue avoidance measures for grasses, trees, indoor molds, outdoor molds, dust mites, and cockroach. - Continue Xyzal (levocetirizine) 5mg  tablet once daily as needed.   - Change Astelin to Atrovent nasal spray 2 sprays each nostril up to 3-4 times a day as needed for nasal drainage/drip control - You can use an extra dose of the antihistamine, if needed, for breakthrough symptoms.  - Consider nasal saline rinses 1-2 times daily to remove allergens from the nasal cavities as well as help with mucous clearance (this is especially helpful to do before the nasal sprays are given)  - Daily controller medication(s): Fluticasone (Flovent) 2 puffs twice daily.  Rinse mouth after use. - Rescue medications: albuterol 2 puffs every 4-6 hours as needed  - Asthma control goals:  * Full participation in all desired activities (may need albuterol before activity) * Albuterol use two time or less a week on average (not counting use with activity) * Cough interfering with sleep two time or less a month * Oral steroids no more than once a year * No hospitalizations  Follow-up 6 months or sooner if needed

## 2022-10-12 ENCOUNTER — Ambulatory Visit: Payer: Medicare PPO | Admitting: Internal Medicine

## 2022-10-15 ENCOUNTER — Encounter: Payer: Self-pay | Admitting: Neurology

## 2022-10-15 ENCOUNTER — Ambulatory Visit: Payer: Medicare PPO | Admitting: Neurology

## 2022-10-15 VITALS — BP 125/78 | HR 67 | Ht 67.0 in | Wt 211.4 lb

## 2022-10-15 DIAGNOSIS — I480 Paroxysmal atrial fibrillation: Secondary | ICD-10-CM

## 2022-10-15 DIAGNOSIS — E66811 Obesity, class 1: Secondary | ICD-10-CM | POA: Diagnosis not present

## 2022-10-15 DIAGNOSIS — Z8673 Personal history of transient ischemic attack (TIA), and cerebral infarction without residual deficits: Secondary | ICD-10-CM | POA: Diagnosis not present

## 2022-10-15 DIAGNOSIS — Z9189 Other specified personal risk factors, not elsewhere classified: Secondary | ICD-10-CM | POA: Diagnosis not present

## 2022-10-15 DIAGNOSIS — G4719 Other hypersomnia: Secondary | ICD-10-CM | POA: Diagnosis not present

## 2022-10-15 DIAGNOSIS — I499 Cardiac arrhythmia, unspecified: Secondary | ICD-10-CM

## 2022-10-15 DIAGNOSIS — R351 Nocturia: Secondary | ICD-10-CM

## 2022-10-15 DIAGNOSIS — Z820 Family history of epilepsy and other diseases of the nervous system: Secondary | ICD-10-CM

## 2022-10-15 DIAGNOSIS — R0683 Snoring: Secondary | ICD-10-CM | POA: Diagnosis not present

## 2022-10-15 NOTE — Progress Notes (Signed)
Subjective:    Patient ID: Joel Payne is a 77 y.o. male.  HPI    Huston Foley, MD, PhD [facility] [address], [address] [address] [place], [place] [address]  Dear Dr. Philip Aspen,   I saw your patient, Joel Payne, upon your kind request in my sleep clinic today for initial consultation of his sleep disorder, in particular, concern for underlying obstructive sleep apnea.  The patient is unaccompanied today.  As you know, Mr. [name] is a 77 year old male with an underlying complex medical history of allergic rhinitis, PAF, on Xarelto, BPH, coronary artery disease, hypertension, hyperlipidemia, nephrolithiasis, history of TIA, history of stroke, and mild obesity, who reports snoring and excessive daytime somnolence.  He has seen my colleague Dr. Pearlean Brownie in the past for stroke.  He has never had a sleep study.  His Epworth sleepiness score is 11 out of 24, fatigue severity score is 26 out of 63. I reviewed your office note from 08/19/2022. He does not notice if he goes into A-fib.  He does not believe he is in A-fib currently but his heart rhythm is irregular.  He has no symptoms such as shortness of breath or palpitations.  He has not seen cardiology in years.  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  He has 3 grown children, is youngest son has sleep apnea.  He denies recurrent morning headaches, he has nocturia about 3 times per average night.  Bedtime is generally between 1030 and 11 PM and rise time around 7 or 8, he has some trouble falling asleep but does not take any medication for it.  He does not have a TV in his bedroom.  They have 2 dogs and 1 cat in the household, they typically do not sleep in their bedroom.  His wife sleeps typically in a separate bedroom because of his snoring.  He quit drinking alcohol when he had his stroke several years ago.  He drinks limited caffeine, 1 or 2 cups of coffee in the  morning.  He is a non-smoker.   His Past Medical History Is Significant For: Past Medical History:  Diagnosis Date   ALLERGIC RHINITIS 11/08/2006   BENIGN PROSTATIC HYPERTROPHY 10/12/2006   CORONARY ARTERY DISEASE 10/12/2006   Catheterization was normal 2012, mild nonobstructive coronary disease, normal LV function   HYPERLIPIDEMIA 11/08/2006   HYPERTENSION 10/12/2006   Kidney stone    in 1980   NEPHROLITHIASIS, HX OF 10/12/2006   PPD positive    Shortness of breath    April, 2012  /  catheterization May, 2012 mild nonobstructive coronary disease   Stroke [facility])    TIA (transient ischemic attack) 10/2015   Urinary frequency 10/18/2009    His Past Surgical History Is Significant For: Past Surgical History:  Procedure Laterality Date   CARDIAC CATHETERIZATION  2012   non radical prostatectomy  08/2011   PROSTATE BIOPSY  2008   TEE WITHOUT CARDIOVERSION N/A 10/31/2015   Procedure: TRANSESOPHAGEAL ECHOCARDIOGRAM (TEE) will alos have loop;  Surgeon: Wendall Stade, MD;  Location: [facility] ENDOSCOPY;  Service: Cardiovascular;  Laterality: N/A;   TONSILLECTOMY      His Family History Is Significant For: Family History  Problem Relation Age of Onset   Heart attack Mother    Cancer Brother    Colon cancer Neg Hx    Esophageal cancer Neg Hx    Rectal cancer Neg Hx  Stomach cancer Neg Hx     His Social History Is Significant For: Social History   Socioeconomic History   Marital status: Married    Spouse name: Not on file   Number of children: 3   Years of education: PhD   Highest education level: Doctorate  Occupational History   Occupation: Anthropology  Tobacco Use   Smoking status: Never   Smokeless tobacco: Never  Vaping Use   Vaping status: Never Used  Substance and Sexual Activity   Alcohol use: Not Currently    Alcohol/week: 1.0 standard drink of alcohol    Types: 1 Standard drinks or equivalent per week    Comment: quit around 2012   Drug use: No   Sexual  activity: Not on file  Other Topics Concern   Not on file  Social History Narrative   Lives with significant other, [name]   Caffeine use: Coffee- 1 cups decaf per day   No tea/soda   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)   Social Determinants of Health   Financial Resource Strain: Low Risk  (10/20/2021)   Overall Financial Resource Strain (CARDIA)    Difficulty of Paying Living Expenses: Not hard at all  Food Insecurity: No Food Insecurity (10/20/2021)   Hunger Vital Sign    Worried About Running Out of Food in the Last Year: Never true    Ran Out of Food in the Last Year: Never true  Transportation Needs: No Transportation Needs (10/20/2021)   PRAPARE - Administrator, Civil Service (Medical): No    Lack of Transportation (Non-Medical): No  Physical Activity: Insufficiently Active (10/20/2021)   Exercise Vital Sign    Days of Exercise per Week: 2 days    Minutes of Exercise per Session: 30 min  Stress: No Stress Concern Present (10/20/2021)   [employer] of Occupational Health - Occupational Stress Questionnaire    Feeling of Stress : Only a little  Social Connections: Moderately Isolated (10/20/2021)   Social Connection and Isolation Panel [NHANES]    Frequency of Communication with Friends and Family: Three times a week    Frequency of Social Gatherings with Friends and Family: Twice a week    Attends Religious Services: Never    Database administrator or Organizations: No    Attends Engineer, structural: Not on file    Marital Status: Married    His Allergies Are:  Allergies  Allergen Reactions   Simvastatin Other (See Comments)    Leg cramps  :   His Current Medications Are:  Outpatient Encounter Medications as of 10/15/2022  Medication Sig   albuterol (VENTOLIN HFA) 108 (90 Base) MCG/ACT inhaler INHALE 2 PUFFS BY MOUTH EVERY 4 TO 6 HOURS AS NEEDED   azelastine (ASTELIN) 0.1 % nasal spray Place 2 sprays into both nostrils as  directed. 2 sprays  each nostril 1 to 2 times a day as needed for nasal drainage control   cyanocobalamin (VITAMIN B12) 1000 MCG/ML injection Inject 0.1 mLs (100 mcg total) into the muscle every 30 (thirty) days.   ipratropium (ATROVENT) 0.06 % nasal spray Place 2 sprays into both nostrils 4 (four) times daily as needed (nasal drainage).   KLOR-CON M20 20 MEQ tablet TAKE 1 TABLET BY MOUTH EVERY DAY   levocetirizine (XYZAL) 5 MG tablet Take 1 tablet (5 mg total) by mouth daily.   metoprolol tartrate (LOPRESSOR) 50 MG tablet TAKE 1 TABLET BY MOUTH TWICE A DAY   pravastatin (  PRAVACHOL) 20 MG tablet TAKE 1 TABLET BY MOUTH EVERY DAY   rivaroxaban (XARELTO) 20 MG TABS tablet Take 1 tablet (20 mg total) by mouth daily with supper.   SYRINGE-NEEDLE, DISP, 3 ML (B-D 3CC LUER-LOK SYR 25GX1") 25G X 1" 3 ML MISC USE FOR B12 INJECTIONS   valsartan-hydrochlorothiazide (DIOVAN-HCT) 160-25 MG tablet Take 1 tablet by mouth daily.   [DISCONTINUED] fluticasone (FLOVENT HFA) 110 MCG/ACT inhaler Inhale 2 puffs into the lungs 2 (two) times daily.   No facility-administered encounter medications on file as of 10/15/2022.  :   Review of Systems:  Out of a complete 14 point review of systems, all are reviewed and negative with the exception of these symptoms as listed below:          Review of Systems  Neurological:        Sleep consult.  Snoring, trouble going to  sleep, up to BR 2 x night, fatigue.ESS 11, FSS 28.     Objective:  Neurological Exam  Physical Exam Physical Examination:   Vitals:   10/15/22 1525  BP: 125/78  Pulse: 67    General Examination: The patient is a very pleasant 77 y.o. male in no acute distress. He appears well-developed and well-nourished and well groomed.   HEENT: Normocephalic, atraumatic, pupils are equal, round and reactive to light, extraocular tracking is good without limitation to gaze excursion or nystagmus noted. Hearing is grossly intact. Face is symmetric  with normal facial animation. Speech is clear with no dysarthria noted. There is no hypophonia. There is no lip, neck/head, jaw or voice tremor. Neck is supple with full range of passive and active motion. There are no carotid bruits on auscultation. Oropharynx exam reveals: mild mouth dryness, adequate dental hygiene and mild airway crowding, due to small airway entry, slightly wider uvula, Mallampati class III, tonsils absent.  Tongue protrudes centrally and palate elevates symmetrically.  Neck circumference 17-1/4 inches, moderate overbite noted.  Chest: Clear to auscultation without wheezing, rhonchi or crackles noted.  Heart: S1+S2+0, irregularly irregular.  No murmurs noted.     Abdomen: Soft, non-tender and non-distended.  Extremities: There is trace pitting edema in the distal lower extremities bilaterally.   Skin: Warm and dry without trophic changes noted.   Musculoskeletal: exam reveals no obvious joint deformities.   Neurologically:  Mental status: The patient is awake, alert and oriented in all 4 spheres. His immediate and remote memory, attention, language skills and fund of knowledge are appropriate. There is no evidence of aphasia, agnosia, apraxia or anomia. Speech is clear with normal prosody and enunciation. Thought process is linear. Mood is normal and affect is normal.  Cranial nerves II - XII are as described above under HEENT exam.  Motor exam: Normal bulk, strength and tone is noted. There is no obvious action or resting tremor.  Fine motor skills and coordination: grossly intact.  Cerebellar testing: No dysmetria or intention tremor. There is no truncal or gait ataxia.  Sensory exam: intact to light touch in the upper and lower extremities.  Gait, station and balance: He stands easily. No veering to one side is noted. No leaning to one side is noted. Posture is age-appropriate and stance is narrow based. Gait shows normal stride length and normal pace. No problems  turning are noted.   Assessment and Plan:  In summary, NAI DASCH is a very pleasant 77 y.o.-year old male with an underlying complex medical history of allergic rhinitis, PAF, on Xarelto, BPH, coronary artery  disease, hypertension, hyperlipidemia, nephrolithiasis, history of TIA, history of stroke, and mild obesity, whose history and physical exam are concerning for sleep disordered breathing, particularly obstructive sleep apnea (OSA). A laboratory attended sleep study is typically considered "gold standard" for evaluation of sleep disordered breathing.   I had a long chat with the patient about my findings and the diagnosis of sleep apnea, particularly OSA, its prognosis and treatment options. We talked about medical/conservative treatments, surgical interventions and non-pharmacological approaches for symptom control. I explained, in particular, the risks and ramifications of untreated moderate to severe OSA, especially with respect to developing cardiovascular disease down the road, including congestive heart failure (CHF), difficult to treat hypertension, cardiac arrhythmias (particularly A-fib), neurovascular complications including TIA, stroke and dementia. Even type 2 diabetes has, in part, been linked to untreated OSA. Symptoms of untreated OSA may include (but may not be limited to) daytime sleepiness, nocturia (i.e. frequent nighttime urination), memory problems, mood irritability and suboptimally controlled or worsening mood disorder such as depression and/or anxiety, lack of energy, lack of motivation, physical discomfort, as well as recurrent headaches, especially morning or nocturnal headaches. We talked about the importance of maintaining a healthy lifestyle and striving for healthy weight.  In addition, we talked about the importance of striving for and maintaining good sleep hygiene. I recommended a sleep study at this time. I outlined the differences between a laboratory attended sleep  study which is considered more comprehensive and accurate over the option of a home sleep test (HST); the latter may lead to underestimation of sleep disordered breathing in some instances and does not help with diagnosing upper airway resistance syndrome and is not accurate enough to diagnose primary central sleep apnea typically. I outlined possible surgical and non-surgical treatment options of OSA, including the use of a positive airway pressure (PAP) device (i.e. CPAP, AutoPAP/APAP or BiPAP in certain circumstances), a custom-made dental device (aka oral appliance, which would require a referral to a specialist dentist or orthodontist typically, and is generally speaking not considered for patients with full dentures or edentulous state), upper airway surgical options, such as traditional UPPP (which is not considered a first-line treatment) or the Inspire device (hypoglossal nerve stimulator, which would involve a referral for consultation with an ENT surgeon, after careful selection, following inclusion criteria - also not first-line treatment). I explained the PAP treatment option to the patient in detail, as this is generally considered first-line treatment.  The patient indicated that he would be willing to try PAP therapy, if the need arises. I explained the importance of being compliant with PAP treatment, not only for insurance purposes but primarily to improve patient's symptoms symptoms, and for the patient's long term health benefit, including to reduce His cardiovascular risks longer-term.    We will pick up our discussion about the next steps and treatment options after testing.  We will keep him posted as to the test results by phone call and/or MyChart messaging where possible.  We will plan to follow-up in sleep clinic accordingly as well.  I answered all his questions today and the patient was in agreement.  Of note, he was noted to have irregular heartbeat today, it was suspicious for  A-fib.  He has no symptoms such as shortness of breath or chest pain or palpitations.  If he has any sudden onset of symptoms such as chest pain, one-sided weakness or numbness or tingling or droopy face or slurring of speech or sudden shortness of breath or palpitations, he is  advised to go to the ER immediately.  He has an appointment with you pending soon.  He is advised to discuss with you his irregular heartbeat and the possibility of seeing cardiology again. I encouraged him to call with any interim questions, concerns, problems or updates or email [place] through MyChart.  Generally speaking, sleep test authorizations may take up to 2 weeks, sometimes less, sometimes longer, the patient is encouraged to get in touch with [place] if they do not hear back from the sleep lab staff directly within the next 2 weeks.  Thank you very much for allowing me to participate in the care of this nice patient. If I can be of any further assistance to you please do not hesitate to call me at [phone].  Sincerely,   Huston Foley, MD, PhD

## 2022-10-15 NOTE — Patient Instructions (Signed)
Thank you for choosing [facility] for your sleep related care! It was nice to meet you today!   Here is what we discussed today:   I believe you could be in atrial fibrillation currently. Please talk to your PCP about seeing a cardiologist again. Should you have any sudden onset of one-sided weakness, numbness, tingling, shortness of breath, chest pain, one-sided headache or severe headache, changes in mental state, please call 911 immediately. You can try Melatonin at night for sleep: take 1 mg to 3 mg, one to 2 hours before your bedtime. You can go up to 5 mg if needed. It is over the counter and comes in pill form, chewable form and spray, if you prefer.   Based on your symptoms and your exam I believe you are at risk for obstructive sleep apnea (aka OSA). We should proceed with a sleep study to determine whether you do or do not have OSA and how severe it is. Even, if you have mild OSA, I may want you to consider treatment with CPAP, as treatment of even borderline or mild sleep apnea can result and improvement of symptoms such as sleep disruption, daytime sleepiness, nighttime bathroom breaks, restless leg symptoms, improvement of headache syndromes, even improved mood disorder.   As explained, an attended sleep study (meaning you get to stay overnight in the sleep lab), lets [place] monitor sleep-related behaviors such as sleep talking and leg movements in sleep, in addition to monitoring for sleep apnea.  A home sleep test is a screening tool for sleep apnea diagnosis only, but unfortunately, does not help with any other sleep-related diagnoses.  Please remember, the long-term risks and ramifications of untreated moderate to severe obstructive sleep apnea may include (but are not limited to): increased risk for cardiovascular disease, including congestive heart failure, stroke, difficult to control hypertension, treatment resistant obesity, arrhythmias, especially irregular heartbeat  commonly known as A. Fib. (atrial fibrillation); even type 2 diabetes has been linked to untreated OSA.   Other correlations that untreated obstructive sleep apnea include macular edema which is swelling of the retina in the eyes, droopy eyelid syndrome, and elevated hemoglobin and hematocrit levels (often referred to as polycythemia).  Sleep apnea can cause disruption of sleep and sleep deprivation in most cases, which, in turn, can cause recurrent headaches, problems with memory, mood, concentration, focus, and vigilance. Most people with untreated sleep apnea report excessive daytime sleepiness, which can affect their ability to drive. Please do not drive or use heavy equipment or machinery, if you feel sleepy! Patients with sleep apnea can also develop difficulty initiating and maintaining sleep (aka insomnia).   Having sleep apnea may increase your risk for other sleep disorders, including involuntary behaviors sleep such as sleep terrors, sleep talking, sleepwalking.    Having sleep apnea can also increase your risk for restless leg syndrome and leg movements at night.   Please note that untreated obstructive sleep apnea may carry additional perioperative morbidity. Patients with significant obstructive sleep apnea (typically, in the moderate to severe degree) should receive, if possible, perioperative PAP (positive airway pressure) therapy and the surgeons and particularly the anesthesiologists should be informed of the diagnosis and the severity of the sleep disordered breathing.   We will call you or email you through MyChart with regards to your test results and plan a follow-up in sleep clinic accordingly. Most likely, you will hear from one of our nurses.   Our sleep lab administrative assistant will call you to schedule  your sleep study and give you further instructions, regarding the check in process for the sleep study, arrival time, what to bring, when you can expect to leave after the  study, etc., and to answer any other logistical questions you may have. If you don't hear back from her by about 2 weeks from now, please feel free to call her direct line at [phone] or you can call our general clinic number, or email [place] through My Chart.

## 2022-10-20 ENCOUNTER — Encounter: Payer: Self-pay | Admitting: Internal Medicine

## 2022-10-20 ENCOUNTER — Ambulatory Visit: Payer: Medicare PPO | Admitting: Internal Medicine

## 2022-10-20 VITALS — BP 125/75 | HR 62 | Temp 97.5°F | Ht 67.0 in | Wt 213.6 lb

## 2022-10-20 DIAGNOSIS — I10 Essential (primary) hypertension: Secondary | ICD-10-CM | POA: Diagnosis not present

## 2022-10-20 DIAGNOSIS — Z23 Encounter for immunization: Secondary | ICD-10-CM | POA: Diagnosis not present

## 2022-10-20 NOTE — Progress Notes (Signed)
Established Patient Office Visit     CC/Reason for Visit: Blood pressure follow-up, requesting flu vaccine  HPI: Joel Payne is a 77 y.o. male who is coming in today for the above mentioned reasons. Past Medical History is significant for: Hypertension, hyperlipidemia, A-fib.  At last visit we had changed his lisinopril for Diovan HCT.  He continues on metoprolol 50 mg twice daily.  He is tolerating medication well.  Requesting flu vaccine.   Past Medical/Surgical History: Past Medical History:  Diagnosis Date   ALLERGIC RHINITIS 11/08/2006   BENIGN PROSTATIC HYPERTROPHY 10/12/2006   CORONARY ARTERY DISEASE 10/12/2006   Catheterization was normal 2012, mild nonobstructive coronary disease, normal LV function   HYPERLIPIDEMIA 11/08/2006   HYPERTENSION 10/12/2006   Kidney stone    in 1980   NEPHROLITHIASIS, HX OF 10/12/2006   PPD positive    Shortness of breath    April, 2012  /  catheterization May, 2012 mild nonobstructive coronary disease   Stroke [facility])    TIA (transient ischemic attack) 10/2015   Urinary frequency 10/18/2009    Past Surgical History:  Procedure Laterality Date   CARDIAC CATHETERIZATION  2012   non radical prostatectomy  08/2011   PROSTATE BIOPSY  2008   TEE WITHOUT CARDIOVERSION N/A 10/31/2015   Procedure: TRANSESOPHAGEAL ECHOCARDIOGRAM (TEE) will alos have loop;  Surgeon: Wendall Stade, MD;  Location: [facility] ENDOSCOPY;  Service: Cardiovascular;  Laterality: N/A;   TONSILLECTOMY      Social History:  reports that he has never smoked. He has never used smokeless tobacco. He reports that he does not currently use alcohol after a past usage of about 1.0 standard drink of alcohol per week. He reports that he does not use drugs.  Allergies: Allergies  Allergen Reactions   Simvastatin Other (See Comments)    Leg cramps    Family History:  Family History  Problem Relation Age of Onset   Heart attack Mother    Cancer Brother    Colon cancer Neg Hx     Esophageal cancer Neg Hx    Rectal cancer Neg Hx    Stomach cancer Neg Hx      Current Outpatient Medications:    albuterol (VENTOLIN HFA) 108 (90 Base) MCG/ACT inhaler, INHALE 2 PUFFS BY MOUTH EVERY 4 TO 6 HOURS AS NEEDED, Disp: 8.5 each, Rfl: 1   azelastine (ASTELIN) 0.1 % nasal spray, Place 2 sprays into both nostrils as directed. 2 sprays  each nostril 1 to 2 times a day as needed for nasal drainage control, Disp: 30 mL, Rfl: 5   cyanocobalamin (VITAMIN B12) 1000 MCG/ML injection, Inject 0.1 mLs (100 mcg total) into the muscle every 30 (thirty) days., Disp: 6 mL, Rfl: 3   ipratropium (ATROVENT) 0.06 % nasal spray, Place 2 sprays into both nostrils 4 (four) times daily as needed (nasal drainage)., Disp: 15 mL, Rfl: 5   KLOR-CON M20 20 MEQ tablet, TAKE 1 TABLET BY MOUTH EVERY DAY, Disp: 90 tablet, Rfl: 1   levocetirizine (XYZAL) 5 MG tablet, Take 1 tablet (5 mg total) by mouth daily., Disp: 30 tablet, Rfl: 5   metoprolol tartrate (LOPRESSOR) 25 MG tablet, Take 25 mg by mouth 2 (two) times daily., Disp: , Rfl:    metoprolol tartrate (LOPRESSOR) 50 MG tablet, TAKE 1 TABLET BY MOUTH TWICE A DAY, Disp: 180 tablet, Rfl: 1   rivaroxaban (XARELTO) 20 MG TABS tablet, Take 1 tablet (20 mg total) by mouth daily with supper., Disp:  90 tablet, Rfl: 1   SYRINGE-NEEDLE, DISP, 3 ML (B-D 3CC LUER-LOK SYR 25GX1") 25G X 1" 3 ML MISC, USE FOR B12 INJECTIONS, Disp: 100 each, Rfl: 3   valsartan-hydrochlorothiazide (DIOVAN-HCT) 160-25 MG tablet, Take 1 tablet by mouth daily., Disp: 90 tablet, Rfl: 1  Review of Systems:  Negative unless indicated in HPI.   Physical Exam: Vitals:   10/20/22 1513  BP: 125/75  Pulse: 62  Temp: (!) 97.5 F (36.4 C)  TempSrc: Oral  SpO2: 95%  Weight: 213 lb 9.6 oz (96.9 kg)  Height: 5\' 7"  (1.702 m)    Body mass index is 33.45 kg/m.   Physical Exam Vitals reviewed.  Constitutional:      Appearance: Normal appearance.  HENT:     Head: Normocephalic and  atraumatic.  Eyes:     Conjunctiva/sclera: Conjunctivae normal.     Pupils: Pupils are equal, round, and reactive to light.  Cardiovascular:     Rate and Rhythm: Normal rate and regular rhythm.  Pulmonary:     Effort: Pulmonary effort is normal.     Breath sounds: Normal breath sounds.  Skin:    General: Skin is warm and dry.  Neurological:     General: No focal deficit present.     Mental Status: He is alert and oriented to person, place, and time.  Psychiatric:        Mood and Affect: Mood normal.        Behavior: Behavior normal.        Thought Content: Thought content normal.        Judgment: Judgment normal.      Impression and Plan:  Essential hypertension -     Basic metabolic panel; Future  Immunization due -     Flu Vaccine Trivalent High Dose (Fluad)   -Blood pressure with improved control on Diovan HCT and metoprolol.  Check basic metabolic profile today to follow renal function and electrolytes on ARB/diuretic combination. -Flu vaccine administered in office today.  Time spent:31 minutes reviewing chart, interviewing and examining patient and formulating plan of care.     Chaya Jan, MD  Primary Care at [facility]

## 2022-10-21 ENCOUNTER — Other Ambulatory Visit: Payer: Self-pay | Admitting: Internal Medicine

## 2022-10-21 ENCOUNTER — Encounter: Payer: Self-pay | Admitting: Internal Medicine

## 2022-10-21 DIAGNOSIS — E876 Hypokalemia: Secondary | ICD-10-CM

## 2022-10-21 LAB — BASIC METABOLIC PANEL
BUN: 14 mg/dL (ref 6–23)
CO2: 31 meq/L (ref 19–32)
Calcium: 9.2 mg/dL (ref 8.4–10.5)
Chloride: 99 meq/L (ref 96–112)
Creatinine, Ser: 0.84 mg/dL (ref 0.40–1.50)
GFR: 84.08 mL/min (ref >60.00)
Glucose, Bld: 78 mg/dL (ref 70–99)
Potassium: 3.5 meq/L (ref 3.5–5.1)
Sodium: 138 meq/L (ref 135–145)

## 2022-10-22 ENCOUNTER — Encounter: Payer: Self-pay | Admitting: Internal Medicine

## 2022-10-22 ENCOUNTER — Ambulatory Visit (INDEPENDENT_AMBULATORY_CARE_PROVIDER_SITE_OTHER): Payer: Medicare PPO | Admitting: Internal Medicine

## 2022-10-22 VITALS — BP 120/76 | HR 66 | Temp 97.5°F | Ht 67.0 in | Wt 207.7 lb

## 2022-10-22 DIAGNOSIS — M545 Low back pain, unspecified: Secondary | ICD-10-CM

## 2022-10-22 NOTE — Progress Notes (Signed)
Established Patient Office Visit     CC/Reason for Visit: Low back pain  HPI: Joel Payne is a 77 y.o. male who is coming in today for the above mentioned reasons.  For the past 3 days has been experiencing bilateral flank pain.  2 days before pain started he had a flat tire and had to change the tire by the side of the roadway.  Wonders if this could have aggravated it.  He has no urinary symptoms, no radiculopathy, no bowel or bladder incontinence, no saddle anesthesia.  Past Medical/Surgical History: Past Medical History:  Diagnosis Date   ALLERGIC RHINITIS 11/08/2006   BENIGN PROSTATIC HYPERTROPHY 10/12/2006   CORONARY ARTERY DISEASE 10/12/2006   Catheterization was normal 2012, mild nonobstructive coronary disease, normal LV function   HYPERLIPIDEMIA 11/08/2006   HYPERTENSION 10/12/2006   Kidney stone    in 1980   NEPHROLITHIASIS, HX OF 10/12/2006   PPD positive    Shortness of breath    April, 2012  /  catheterization May, 2012 mild nonobstructive coronary disease   Stroke [facility])    TIA (transient ischemic attack) 10/2015   Urinary frequency 10/18/2009    Past Surgical History:  Procedure Laterality Date   CARDIAC CATHETERIZATION  2012   non radical prostatectomy  08/2011   PROSTATE BIOPSY  2008   TEE WITHOUT CARDIOVERSION N/A 10/31/2015   Procedure: TRANSESOPHAGEAL ECHOCARDIOGRAM (TEE) will alos have loop;  Surgeon: Wendall Stade, MD;  Location: [facility] ENDOSCOPY;  Service: Cardiovascular;  Laterality: N/A;   TONSILLECTOMY      Social History:  reports that he has never smoked. He has never used smokeless tobacco. He reports that he does not currently use alcohol after a past usage of about 1.0 standard drink of alcohol per week. He reports that he does not use drugs.  Allergies: Allergies  Allergen Reactions   Simvastatin Other (See Comments)    Leg cramps    Family History:  Family History  Problem Relation Age of Onset   Heart attack Mother    Cancer  Brother    Colon cancer Neg Hx    Esophageal cancer Neg Hx    Rectal cancer Neg Hx    Stomach cancer Neg Hx      Current Outpatient Medications:    albuterol (VENTOLIN HFA) 108 (90 Base) MCG/ACT inhaler, INHALE 2 PUFFS BY MOUTH EVERY 4 TO 6 HOURS AS NEEDED, Disp: 8.5 each, Rfl: 1   azelastine (ASTELIN) 0.1 % nasal spray, Place 2 sprays into both nostrils as directed. 2 sprays  each nostril 1 to 2 times a day as needed for nasal drainage control, Disp: 30 mL, Rfl: 5   cyanocobalamin (VITAMIN B12) 1000 MCG/ML injection, Inject 0.1 mLs (100 mcg total) into the muscle every 30 (thirty) days., Disp: 6 mL, Rfl: 3   ipratropium (ATROVENT) 0.06 % nasal spray, Place 2 sprays into both nostrils 4 (four) times daily as needed (nasal drainage)., Disp: 15 mL, Rfl: 5   KLOR-CON M20 20 MEQ tablet, TAKE 1 TABLET BY MOUTH EVERY DAY, Disp: 90 tablet, Rfl: 1   levocetirizine (XYZAL) 5 MG tablet, Take 1 tablet (5 mg total) by mouth daily., Disp: 30 tablet, Rfl: 5   metoprolol tartrate (LOPRESSOR) 25 MG tablet, Take 25 mg by mouth 2 (two) times daily., Disp: , Rfl:    metoprolol tartrate (LOPRESSOR) 50 MG tablet, TAKE 1 TABLET BY MOUTH TWICE A DAY, Disp: 180 tablet, Rfl: 1   rivaroxaban (XARELTO) 20  MG TABS tablet, Take 1 tablet (20 mg total) by mouth daily with supper., Disp: 90 tablet, Rfl: 1   SYRINGE-NEEDLE, DISP, 3 ML (B-D 3CC LUER-LOK SYR 25GX1") 25G X 1" 3 ML MISC, USE FOR B12 INJECTIONS, Disp: 100 each, Rfl: 3   valsartan-hydrochlorothiazide (DIOVAN-HCT) 160-25 MG tablet, Take 1 tablet by mouth daily., Disp: 90 tablet, Rfl: 1  Review of Systems:  Negative unless indicated in HPI.   Physical Exam: Vitals:   10/22/22 1139  BP: 120/76  Pulse: 66  Temp: (!) 97.5 F (36.4 C)  TempSrc: Oral  SpO2: 98%  Weight: 207 lb 11.2 oz (94.2 kg)  Height: 5\' 7"  (1.702 m)    Body mass index is 32.53 kg/m.   Physical Exam Vitals reviewed.  Constitutional:      Appearance: Normal appearance.  HENT:      Head: Normocephalic and atraumatic.  Eyes:     Conjunctiva/sclera: Conjunctivae normal.     Pupils: Pupils are equal, round, and reactive to light.  Pulmonary:     Breath sounds: Normal breath sounds.  Skin:    General: Skin is warm and dry.  Neurological:     General: No focal deficit present.     Mental Status: He is alert and oriented to person, place, and time.  Psychiatric:        Mood and Affect: Mood normal.        Behavior: Behavior normal.        Thought Content: Thought content normal.        Judgment: Judgment normal.      Impression and Plan:  Acute bilateral low back pain without sciatica  -Suspect this is simply musculoskeletal pain given lack of red flag signs/symptoms. -Advised icing, as needed NSAIDs, back stretches, local massage therapy. -If no improvement in 3 to 4 weeks, can consider referral to physical therapy.    Time spent:22 minutes reviewing chart, interviewing and examining patient and formulating plan of care.     Chaya Jan, MD [facility] at [facility]

## 2022-10-28 ENCOUNTER — Other Ambulatory Visit: Payer: Self-pay | Admitting: Internal Medicine

## 2022-10-28 ENCOUNTER — Encounter: Payer: Self-pay | Admitting: Internal Medicine

## 2022-10-28 DIAGNOSIS — I480 Paroxysmal atrial fibrillation: Secondary | ICD-10-CM

## 2022-10-28 DIAGNOSIS — I10 Essential (primary) hypertension: Secondary | ICD-10-CM

## 2022-10-28 MED ORDER — RIVAROXABAN 20 MG PO TABS: 20.0000 mg | ORAL_TABLET | Freq: Every day | ORAL | 1 refills | Status: DC

## 2022-11-10 ENCOUNTER — Telehealth: Payer: Self-pay | Admitting: Neurology

## 2022-11-10 NOTE — Telephone Encounter (Signed)
11/09/22 LVM [place] 10/26/22 [employer] auth: [id number] (exp. 10/26/22 to 02/11/23) EE

## 2022-11-11 NOTE — Telephone Encounter (Signed)
I spoke with the patient on the phone to schedule his SS. He requested it for next year due to his wife having eye surgery. He is scheduled for 02/23/23 at 8 pm.  Also mailed packet to the patient.

## 2022-11-23 ENCOUNTER — Encounter: Payer: Self-pay | Admitting: Gastroenterology

## 2023-01-10 ENCOUNTER — Other Ambulatory Visit: Payer: Self-pay | Admitting: Internal Medicine

## 2023-01-13 DIAGNOSIS — I4819 Other persistent atrial fibrillation: Secondary | ICD-10-CM

## 2023-01-24 ENCOUNTER — Other Ambulatory Visit: Payer: Self-pay | Admitting: Internal Medicine

## 2023-01-24 DIAGNOSIS — I10 Essential (primary) hypertension: Secondary | ICD-10-CM

## 2023-01-26 NOTE — Telephone Encounter (Signed)
 [name]: [id number] (exp. 01/26/23 to 05/11/23).  Patient is scheduled at [facility] for 02/23/23 at 8 pm.

## 2023-02-09 ENCOUNTER — Ambulatory Visit (INDEPENDENT_AMBULATORY_CARE_PROVIDER_SITE_OTHER): Payer: Medicare PPO | Admitting: Family Medicine

## 2023-02-09 DIAGNOSIS — Z0000 Encounter for general adult medical examination without abnormal findings: Secondary | ICD-10-CM

## 2023-02-09 NOTE — Progress Notes (Signed)
PATIENT CHECK-IN and HEALTH RISK ASSESSMENT QUESTIONNAIRE:  -completed by phone/video for upcoming Medicare Preventive Visit   Pre-Visit Check-in: 1)Vitals (height, wt, BP, etc) - record in vitals section for visit on day of visit Request home vitals (wt, BP, etc.) and enter into vitals, THEN update Vital Signs SmartPhrase below at the top of the HPI. See below.  2)Review and Update Medications, Allergies PMH, Surgeries, Social history in Epic 3)Hospitalizations in the last year with date/reason?  no  4)Review and Update Care Team (patient's specialists) in Epic 5) Complete PHQ9 in Epic  6) Complete Fall Screening in Epic 7)Review all Health Maintenance Due and order under PCP if not done.  Medicare Wellness Patient Questionnaire:  Answer theses question about your habits: How often do you have a drink containing alcohol? never Have you ever smoked? never Do you use an illicit drugs? no On average, how many days per week do you engage in moderate to strenuous exercise (like a brisk walk)? 0 Diet: not as good lately, has been eating out more, also now has been drinking more sodas  Answer theses question about your everyday activities: Can you perform most household chores?yes Are you deaf or have significant trouble hearing? no Do you feel that you have a problem with memory?no Do you feel safe at home?yes Last dentist visit? Went last week 8. Do you have any difficulty performing your everyday activities?n Are you having any difficulty walking, taking medications on your own, and or difficulty managing daily home needs?n Do you have difficulty walking or climbing stairs?n Do you have difficulty dressing or bathing? n Do you have difficulty doing errands alone such as visiting a doctor's office or shopping?n Do you currently have any difficulty preparing food and eating?n Do you currently have any difficulty using the toilet?n Do you have any difficulty managing your  finances?n Do you have any difficulties with housekeeping of managing your housekeeping?n   Do you have Advanced Directives in place (Living Will, Healthcare Power or Attorney)? y   Last eye Exam and location? Has appt 4th of Feb, Dr. [place]   Do you currently use prescribed or non-prescribed narcotic or opioid pain medications?no     ----------------------------------------------------------------------------------------------------------------------------------------------------------------------------------------------------------------------  Because this visit was a virtual/telehealth visit, some criteria may be missing or patient reported. Any vitals not documented were not able to be obtained and vitals that have been documented are patient reported.    MEDICARE ANNUAL PREVENTIVE CARE VISIT WITH PROVIDER (Welcome to Medicare, initial annual wellness or annual wellness exam)  Virtual Visit via Video Note  I connected with Joel Payne on 02/09/23  by a video enabled telemedicine application and verified that I am speaking with the correct person using two identifiers.  Location patient: home Location provider:work or home office Persons participating in the virtual visit: patient, provider  Concerns and/or follow up today: doing well, no new concerns. A few weeks ago had a few headaches, none the last few days. Not worst headache of life.  Occasionally notices a little dry skin on the bottoms of the feet and feels a little tingly at time the last 6 months.  Diet has gotten worse, reports is eating more food on the run, no cooking as much. Eating out more as is on the run with work a lot. Also has not been getting as much exercise.   See HM section in Epic for other details of completed HM.    ROS: negative for report of fevers, unintentional weight loss,  vision changes, vision loss, hearing loss or change, chest pain, sob, hemoptysis, melena, hematochezia, hematuria,  falls, bleeding or bruising, thoughts of suicide or self harm, memory loss  Patient-completed extensive health risk assessment - reviewed and discussed with the patient: See Health Risk Assessment completed with patient prior to the visit either above or in recent phone note. This was reviewed in detailed with the patient today and appropriate recommendations, orders and referrals were placed as needed per Summary below and patient instructions.   Review of Medical History: -PMH, PSH, Family History and current specialty and care providers reviewed and updated and listed below   Patient Care Team: Philip Aspen, Limmie Patricia, MD as PCP - General (Internal Medicine)   Past Medical History:  Diagnosis Date   ALLERGIC RHINITIS 11/08/2006   BENIGN PROSTATIC HYPERTROPHY 10/12/2006   CORONARY ARTERY DISEASE 10/12/2006   Catheterization was normal 2012, mild nonobstructive coronary disease, normal LV function   HYPERLIPIDEMIA 11/08/2006   HYPERTENSION 10/12/2006   Kidney stone    in 1980   NEPHROLITHIASIS, HX OF 10/12/2006   PPD positive    Shortness of breath    April, 2012  /  catheterization May, 2012 mild nonobstructive coronary disease   Stroke [facility])    TIA (transient ischemic attack) 10/2015   Urinary frequency 10/18/2009    Past Surgical History:  Procedure Laterality Date   CARDIAC CATHETERIZATION  2012   non radical prostatectomy  08/2011   PROSTATE BIOPSY  2008   TEE WITHOUT CARDIOVERSION N/A 10/31/2015   Procedure: TRANSESOPHAGEAL ECHOCARDIOGRAM (TEE) will alos have loop;  Surgeon: Wendall Stade, MD;  Location: [facility]COPY;  Service: Cardiovascular;  Laterality: N/A;   TONSILLECTOMY      Social History   Socioeconomic History   Marital status: Married    Spouse name: Not on file   Number of children: 3   Years of education: PhD   Highest education level: Doctorate  Occupational History   Occupation: Anthropology  Tobacco Use   Smoking status: Never   Smokeless  tobacco: Never  Vaping Use   Vaping status: Never Used  Substance and Sexual Activity   Alcohol use: Not Currently    Alcohol/week: 1.0 standard drink of alcohol    Types: 1 Standard drinks or equivalent per week    Comment: quit around 2012   Drug use: No   Sexual activity: Not on file  Other Topics Concern   Not on file  Social History Narrative   Lives with significant other, [name]   Caffeine use: Coffee- 1 cups decaf per day   No tea/soda   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)   Social Drivers of Health   Financial Resource Strain: Low Risk  (10/19/2022)   Overall Financial Resource Strain (CARDIA)    Difficulty of Paying Living Expenses: Not hard at all  Food Insecurity: No Food Insecurity (10/19/2022)   Hunger Vital Sign    Worried About Running Out of Food in the Last Year: Never true    Ran Out of Food in the Last Year: Never true  Transportation Needs: No Transportation Needs (10/19/2022)   PRAPARE - Administrator, Civil Service (Medical): No    Lack of Transportation (Non-Medical): No  Physical Activity: Insufficiently Active (10/20/2021)   Exercise Vital Sign    Days of Exercise per Week: 2 days    Minutes of Exercise per Session: 30 min  Stress: Stress Concern Present (10/19/2022)   [employer] of  Occupational Health - Occupational Stress Questionnaire    Feeling of Stress : To some extent  Social Connections: Moderately Isolated (10/20/2021)   Social Connection and Isolation Panel [NHANES]    Frequency of Communication with Friends and Family: Three times a week    Frequency of Social Gatherings with Friends and Family: Twice a week    Attends Religious Services: Never    Database administrator or Organizations: No    Attends Engineer, structural: Not on file    Marital Status: Married  Catering manager Violence: Not on file    Family History  Problem Relation Age of Onset   Heart attack Mother    Cancer Brother     Colon cancer Neg Hx    Esophageal cancer Neg Hx    Rectal cancer Neg Hx    Stomach cancer Neg Hx     Current Outpatient Medications on File Prior to Visit  Medication Sig Dispense Refill   albuterol (VENTOLIN HFA) 108 (90 Base) MCG/ACT inhaler INHALE 2 PUFFS BY MOUTH EVERY 4 TO 6 HOURS AS NEEDED 8.5 each 1   azelastine (ASTELIN) 0.1 % nasal spray Place 2 sprays into both nostrils as directed. 2 sprays  each nostril 1 to 2 times a day as needed for nasal drainage control 30 mL 5   cyanocobalamin (VITAMIN B12) 1000 MCG/ML injection Inject 0.1 mLs (100 mcg total) into the muscle every 30 (thirty) days. 6 mL 3   ipratropium (ATROVENT) 0.06 % nasal spray Place 2 sprays into both nostrils 4 (four) times daily as needed (nasal drainage). 15 mL 5   KLOR-CON M20 20 MEQ tablet TAKE 1 TABLET BY MOUTH EVERY DAY 90 tablet 1   levocetirizine (XYZAL) 5 MG tablet Take 1 tablet (5 mg total) by mouth daily. 30 tablet 5   metoprolol tartrate (LOPRESSOR) 25 MG tablet TAKE 1 TABLET BY MOUTH TWICE A DAY 180 tablet 1   metoprolol tartrate (LOPRESSOR) 50 MG tablet TAKE 1 TABLET BY MOUTH TWICE A DAY 180 tablet 1   rivaroxaban (XARELTO) 20 MG TABS tablet Take 1 tablet (20 mg total) by mouth daily with supper. 90 tablet 1   SYRINGE-NEEDLE, DISP, 3 ML (B-D 3CC LUER-LOK SYR 25GX1") 25G X 1" 3 ML MISC USE FOR B12 INJECTIONS 100 each 3   valsartan-hydrochlorothiazide (DIOVAN-HCT) 160-25 MG tablet TAKE 1 TABLET BY MOUTH EVERY DAY 90 tablet 1   No current facility-administered medications on file prior to visit.    Allergies  Allergen Reactions   Simvastatin Other (See Comments)    Leg cramps       Physical Exam Vitals requested from patient and listed below if patient had equipment and was able to obtain at home for this virtual visit: There were no vitals filed for this visit. Estimated body mass index is 32.53 kg/m as calculated from the following:   Height as of 10/22/22: 5\' 7"  (1.702 m).   Weight as of  10/22/22: 207 lb 11.2 oz (94.2 kg).  EKG (optional): deferred due to virtual visit  GENERAL: alert, oriented, no acute distress detected; full vision exam deferred due to pandemic and/or virtual encounter  HEENT: atraumatic, conjunttiva clear, no obvious abnormalities on inspection of external nose and ears  NECK: normal movements of the head and neck  LUNGS: on inspection no signs of respiratory distress, breathing rate appears normal, no obvious gross SOB, gasping or wheezing  CV: no obvious cyanosis  MS: moves all visible extremities without noticeable abnormality  PSYCH/NEURO: pleasant and cooperative, no obvious depression or anxiety, speech and thought processing grossly intact, Cognitive function grossly intact  Flowsheet Row Office Visit from 10/20/2022 in [facility] [place]  PHQ-9 Total Score 1           02/09/2023    3:50 PM 10/20/2022    3:47 PM 08/19/2022    1:05 PM 05/05/2022    1:15 PM 01/20/2022   11:22 AM  Depression screen PHQ 2/9  Decreased Interest 0 0 1 1 0  Down, Depressed, Hopeless 0 0 1 1 0  PHQ - 2 Score 0 0 2 2 0  Altered sleeping  1 2 1 1   Tired, decreased energy  0 2 1 1   Change in appetite  0 3 2 2   Feeling bad or failure about yourself   0 0 0 0  Trouble concentrating  0 0 0 0  Moving slowly or fidgety/restless  0 0 0 0  Suicidal thoughts  0 0 0 0  PHQ-9 Score  1 9 6 4   Difficult doing work/chores  Not difficult at all Somewhat difficult Not difficult at all Not difficult at all       05/05/2022    1:15 PM 08/19/2022    1:05 PM 10/19/2022    5:22 PM 10/20/2022    3:46 PM 02/09/2023    3:50 PM  Fall Risk  Falls in the past year? 0 0 0 0 0  Was there an injury with Fall? 0 0  0 0  Fall Risk Category Calculator 0 0  0 0  Patient at Risk for Falls Due to    No Fall Risks   Fall risk Follow up Falls evaluation completed Falls evaluation completed  Falls evaluation completed Falls evaluation completed;Education  provided;Falls prevention discussed     SUMMARY AND PLAN:  Encounter for Medicare annual wellness exam   Discussed applicable health maintenance/preventive health measures and advised and referred or ordered per patient preferences:  Health Maintenance  Topic Date Due   Medicare Annual Wellness (AWV)  02/09/2024   DTaP/Tdap/Td (3 - Td or Tdap) 08/25/2028   Pneumonia Vaccine 12+ Years old  Completed   INFLUENZA VACCINE  Completed   COVID-19 Vaccine  Completed   Hepatitis C Screening  Completed   Zoster Vaccines- Shingrix  Completed   HPV VACCINES  Aged Out   Colonoscopy  Discontinued     Education and counseling on the following was provided based on the above review of health and a plan/checklist for the patient, along with additional information discussed, was provided for the patient in the patient instructions :   -Provided counseling and plan for increased risk of falling if applicable per above screening. Reviewed and demonstrated safe balance exercises that can be done at home to improve balance and discussed exercise guidelines for adults with include balance exercises at least 3 days per week.  -Advised and counseled on a healthy lifestyle - including the importance of a healthy diet, regular physical activity, social connections and stress management. -Reviewed patient's current diet. Advised and counseled on a whole foods based healthy diet. A summary of a healthy diet was provided in the Patient Instructions. Encouraged to move towards healthy, unprocessed and lower sugar foods as much as possible. His Hgba1c has gradually crept up. Discussed reversing this trend.  -reviewed patient's current physical activity level and discussed exercise guidelines for adults. Discussed community resources and ideas for safe exercise at home to assist in meeting  exercise guideline recommendations in a safe and healthy way. He reports he plans to get back into walking and consider adding the  balance exercises we reviewed today. He also seems interested in adding home exercise and/or going to the gym.  -Advise yearly dental visits at minimum and regular eye exams -advised inperson visit with Dr. Ardyth Harps to address concerns and recheck labs, sent message to schedulers to assist and advised him to call office if he is not contacted in the next 1-2 day.  Follow up: see patient instructions   Patient Instructions  I really enjoyed getting to talk with you today! I am available on Tuesdays and Thursdays for virtual visits if you have any questions or concerns, or if I can be of any further assistance.   CHECKLIST FROM ANNUAL WELLNESS VISIT:  -Follow up (please call to schedule if not scheduled after visit):   -schedule inperson visit in the next 1-2 weeks to address concerns   -yearly for annual wellness visit with primary care office  Here is a list of your preventive care/health maintenance measures and the plan for each if any are due:  PLAN For any measures below that may be due:   Health Maintenance  Topic Date Due   Medicare Annual Wellness (AWV)  02/09/2024   DTaP/Tdap/Td (3 - Td or Tdap) 08/25/2028   Pneumonia Vaccine 4+ Years old  Completed   INFLUENZA VACCINE  Completed   COVID-19 Vaccine  Completed   Hepatitis C Screening  Completed   Zoster Vaccines- Shingrix  Completed   HPV VACCINES  Aged Out   Colonoscopy  Discontinued    -See a dentist at least yearly  -Get your eyes checked and then per your eye specialist's recommendations  -Other issues addressed today:   -I have included below further information regarding a healthy whole foods based diet, physical activity guidelines for adults, stress management and opportunities for social connections. I hope you find this information useful.    -----------------------------------------------------------------------------------------------------------------------------------------------------------------------------------------------------------------------------------------------------------  NUTRITION: -eat real food: lots of colorful vegetables (half the plate) and fruits -5-7 servings of vegetables and fruits per day (fresh or steamed is best), exp. 2 servings of vegetables with lunch and dinner and 2 servings of fruit per day. Berries and greens such as kale and collards are great choices.  -consume on a regular basis:  fresh fruits, fresh veggies, fish, nuts, seeds, healthy oils (such as olive oil, avocado oil), whole grains (make sure first ingredient on label contains the word "whole"), -can eat small amounts of dairy and lean meat (no larger than the palm of your hand), but avoid processed meats such as ham, bacon, lunch meat, etc. -drink water -try to avoid fast food and pre-packaged foods, processed meat, ultra processed foods (donuts, candy, etc.) -most experts advise limiting sodium to < 2300mg  per day, should limit further is any chronic conditions such as high blood pressure, heart disease, diabetes, etc. The American Heart Association advised that < 1500mg  is is ideal -try to avoid foods that contain any ingredients with names you do not recognize  -try to avoid foods with added sugar or sweeteners/sweets  -try to avoid sweet drinks -try to avoid white rice, white bread, pasta (unless whole grain)  EXERCISE GUIDELINES FOR ADULTS: -if you wish to increase your physical activity, do so gradually and with the approval of your doctor -STOP and seek medical care immediately if you have any chest pain, chest discomfort or trouble breathing when starting or increasing exercise  -move  and stretch your body, legs, feet and arms when sitting for long periods -Physical activity guidelines for optimal health in adults: -get at  least 150 minutes per week of aerobic exercise (can talk, but not sing) 20-30 minutes of sustained activity or two 10 minute episodes of sustained activity every day.  -do some muscle building/resistance training at least 2 days per week  -balance exercises 3+ days per week:   Stand somewhere where you have something sturdy to hold onto if you lose balance.    1) lift up on toes, start with 5x per day and work up to 20x   2) stand and lift on leg straight out to the side so that foot is a few inches of the floor, start with 5x each side and work up to 20x each side   3) stand on one foot, start with 5 seconds each side and work up to 20 seconds on each side  If you need ideas or help with getting more active:  -Silver sneakers https://tools.silversneakers.com  -Walk with a Doc: http://www.duncan-williams.com/  -try to include resistance (weight lifting/strength building) and balance exercises twice per week: or the following link for ideas: http://castillo-powell.com/  BuyDucts.dk  STRESS MANAGEMENT: -can try meditating, or just sitting quietly with deep breathing while intentionally relaxing all parts of your body for 5 minutes daily -if you need further help with stress, anxiety or depression please follow up with your primary doctor or contact the wonderful folks at [facility]: [phone]  SOCIAL CONNECTIONS: -options in [place] if you wish to engage in more social and exercise related activities:  -Silver sneakers https://tools.silversneakers.com  -Walk with a Doc: http://www.duncan-williams.com/  -Check out the [facility] Active Adults 50+ section on the [place] of [employer] (hiking clubs, book clubs, cards and games, chess, exercise classes, aquatic classes and much more) - see the website for  details: https://www.[place].gov/departments/parks-recreation/active-adults50  -YouTube has lots of exercise videos for different ages and abilities as well  -[facility] (a variety of indoor and outdoor inperson activities for adults). (639)258-1974. [address]  -Virtual Online Classes (a variety of topics): see seniorplanet.org or call [phone]  -consider volunteering at a school, hospice center, church, senior center or elsewhere           Terressa Koyanagi, DO

## 2023-02-09 NOTE — Patient Instructions (Signed)
I really enjoyed getting to talk with you today! I am available on Tuesdays and Thursdays for virtual visits if you have any questions or concerns, or if I can be of any further assistance.   CHECKLIST FROM ANNUAL WELLNESS VISIT:  -Follow up (please call to schedule if not scheduled after visit):   -schedule inperson visit in the next 1-2 weeks to address concerns   -yearly for annual wellness visit with primary care office  Here is a list of your preventive care/health maintenance measures and the plan for each if any are due:  PLAN For any measures below that may be due:   Health Maintenance  Topic Date Due   Medicare Annual Wellness (AWV)  02/09/2024   DTaP/Tdap/Td (3 - Td or Tdap) 08/25/2028   Pneumonia Vaccine 70+ Years old  Completed   INFLUENZA VACCINE  Completed   COVID-19 Vaccine  Completed   Hepatitis C Screening  Completed   Zoster Vaccines- Shingrix  Completed   HPV VACCINES  Aged Out   Colonoscopy  Discontinued    -See a dentist at least yearly  -Get your eyes checked and then per your eye specialist's recommendations  -Other issues addressed today:   -I have included below further information regarding a healthy whole foods based diet, physical activity guidelines for adults, stress management and opportunities for social connections. I hope you find this information useful.   -----------------------------------------------------------------------------------------------------------------------------------------------------------------------------------------------------------------------------------------------------------  NUTRITION: -eat real food: lots of colorful vegetables (half the plate) and fruits -5-7 servings of vegetables and fruits per day (fresh or steamed is best), exp. 2 servings of vegetables with lunch and dinner and 2 servings of fruit per day. Berries and greens such as kale and collards are great choices.  -consume on a regular basis:  fresh  fruits, fresh veggies, fish, nuts, seeds, healthy oils (such as olive oil, avocado oil), whole grains (make sure first ingredient on label contains the word "whole"), -can eat small amounts of dairy and lean meat (no larger than the palm of your hand), but avoid processed meats such as ham, bacon, lunch meat, etc. -drink water -try to avoid fast food and pre-packaged foods, processed meat, ultra processed foods (donuts, candy, etc.) -most experts advise limiting sodium to < 2300mg  per day, should limit further is any chronic conditions such as high blood pressure, heart disease, diabetes, etc. The American Heart Association advised that < 1500mg  is is ideal -try to avoid foods that contain any ingredients with names you do not recognize  -try to avoid foods with added sugar or sweeteners/sweets  -try to avoid sweet drinks -try to avoid white rice, white bread, pasta (unless whole grain)  EXERCISE GUIDELINES FOR ADULTS: -if you wish to increase your physical activity, do so gradually and with the approval of your doctor -STOP and seek medical care immediately if you have any chest pain, chest discomfort or trouble breathing when starting or increasing exercise  -move and stretch your body, legs, feet and arms when sitting for long periods -Physical activity guidelines for optimal health in adults: -get at least 150 minutes per week of aerobic exercise (can talk, but not sing) 20-30 minutes of sustained activity or two 10 minute episodes of sustained activity every day.  -do some muscle building/resistance training at least 2 days per week  -balance exercises 3+ days per week:   Stand somewhere where you have something sturdy to hold onto if you lose balance.    1) lift up on toes, start with 5x  per day and work up to 20x   2) stand and lift on leg straight out to the side so that foot is a few inches of the floor, start with 5x each side and work up to 20x each side   3) stand on one foot, start  with 5 seconds each side and work up to 20 seconds on each side  If you need ideas or help with getting more active:  -Silver sneakers https://tools.silversneakers.com  -Walk with a Doc: http://www.duncan-williams.com/  -try to include resistance (weight lifting/strength building) and balance exercises twice per week: or the following link for ideas: http://castillo-powell.com/  BuyDucts.dk  STRESS MANAGEMENT: -can try meditating, or just sitting quietly with deep breathing while intentionally relaxing all parts of your body for 5 minutes daily -if you need further help with stress, anxiety or depression please follow up with your primary doctor or contact the wonderful folks at [facility]: [phone]  SOCIAL CONNECTIONS: -options in [place] if you wish to engage in more social and exercise related activities:  -Silver sneakers https://tools.silversneakers.com  -Walk with a Doc: http://www.duncan-williams.com/  -Check out the [facility] Active Adults 50+ section on the [place] [employer] (hiking clubs, book clubs, cards and games, chess, exercise classes, aquatic classes and much more) - see the website for details: https://www.[place].gov/departments/parks-recreation/active-adults50  -YouTube has lots of exercise videos for different ages and abilities as well  -[facility] (a variety of indoor and outdoor inperson activities for adults). ([phone]. [address]  -Virtual Online Classes (a variety of topics): see seniorplanet.org or call [phone]  -consider volunteering at a school, hospice center, church, senior center or elsewhere

## 2023-02-10 ENCOUNTER — Telehealth: Payer: Self-pay | Admitting: Internal Medicine

## 2023-02-10 NOTE — Telephone Encounter (Signed)
-----   Message from Terressa Koyanagi sent at 02/09/2023  4:21 PM EST ----- Please assist in scheduling in office visit. Thanks!

## 2023-02-10 NOTE — Telephone Encounter (Signed)
Pt has been sch for cpe

## 2023-02-23 ENCOUNTER — Ambulatory Visit (INDEPENDENT_AMBULATORY_CARE_PROVIDER_SITE_OTHER): Payer: Medicare PPO | Admitting: Neurology

## 2023-02-23 DIAGNOSIS — R351 Nocturia: Secondary | ICD-10-CM

## 2023-02-23 DIAGNOSIS — E66811 Obesity, class 1: Secondary | ICD-10-CM

## 2023-02-23 DIAGNOSIS — Z820 Family history of epilepsy and other diseases of the nervous system: Secondary | ICD-10-CM

## 2023-02-23 DIAGNOSIS — G4733 Obstructive sleep apnea (adult) (pediatric): Secondary | ICD-10-CM

## 2023-02-23 DIAGNOSIS — Z9189 Other specified personal risk factors, not elsewhere classified: Secondary | ICD-10-CM

## 2023-02-23 DIAGNOSIS — I499 Cardiac arrhythmia, unspecified: Secondary | ICD-10-CM

## 2023-02-23 DIAGNOSIS — G4719 Other hypersomnia: Secondary | ICD-10-CM

## 2023-02-23 DIAGNOSIS — Z8673 Personal history of transient ischemic attack (TIA), and cerebral infarction without residual deficits: Secondary | ICD-10-CM

## 2023-02-23 DIAGNOSIS — R0683 Snoring: Secondary | ICD-10-CM | POA: Diagnosis not present

## 2023-02-23 DIAGNOSIS — R9431 Abnormal electrocardiogram [ECG] [EKG]: Secondary | ICD-10-CM

## 2023-02-23 DIAGNOSIS — G4720 Circadian rhythm sleep disorder, unspecified type: Secondary | ICD-10-CM

## 2023-02-23 DIAGNOSIS — G4761 Periodic limb movement disorder: Secondary | ICD-10-CM

## 2023-02-23 DIAGNOSIS — I480 Paroxysmal atrial fibrillation: Secondary | ICD-10-CM

## 2023-03-04 NOTE — Procedures (Signed)
 Physician Interpretation:     [facility] POLYSOMNOGRAPHY  INTERPRETATION REPORT   STUDY DATE:  02/23/2023     PATIENT NAME:  Joel Payne         DATE OF BIRTH:  Jun 03, 1945  PATIENT ID:  [id number]    TYPE OF STUDY:  PSG  READING PHYSICIAN: Huston Foley, MD, PhD   SCORING TECHNICIAN: Domingo Cocking, RPSGT   Referred by: Philip Aspen, Limmie Patricia, MD  ? History and Indication for Testing: 78 year old male with an underlying complex medical history of allergic rhinitis, PAF, on Xarelto, BPH, coronary artery disease, hypertension, hyperlipidemia, nephrolithiasis, history of TIA, history of stroke, and mild obesity, who reports snoring and excessive daytime somnolence. His Epworth sleepiness score is 11 out of 24, fatigue severity score is 26 out of 63.  Height: 67 in Weight: 211 lb (BMI 33) Neck Size: 17 in    MEDICATIONS: Ventolin HFA, Astelin, Vitamin B12, Atrovent, Klor-Con, XYZal, Lopressor, Pravachol, Xarelto, Diovan-HCT   TECHNICAL DESCRIPTION: A registered sleep technologist was in attendance for the duration of the recording.  Data collection, scoring, video monitoring, and reporting were performed in compliance with the AASM Manual for the Scoring of Sleep and Associated Events; (Hypopnea is scored based on the criteria listed in Section VIII D. 1b in the AASM Manual V2.6 using a 4% oxygen desaturation rule or Hypopnea is scored based on the criteria listed in Section VIII D. 1a in the AASM Manual V2.6 using 3% oxygen desaturation and /or arousal rule).   SLEEP CONTINUITY AND SLEEP ARCHITECTURE:  Lights-out was at 21:58: and lights-on at  04:43:, with a total recording time of 6 hours, 45 min. Total sleep time ( TST) was 213.0 minutes with a decreased sleep efficiency at 52.6%. There was  8.7% REM sleep.  BODY POSITION:  TST was divided  between the following sleep positions: 0.9% supine;  99.1% lateral;  0% prone. Duration of total sleep and percent  of total sleep in their respective position is as follows: supine 02 minutes (1%), non-supine 211 minutes (99%); right 113 minutes (53%), left 97 minutes (46%), and prone 00 minutes (0%).  Total supine REM sleep time was 00 minutes (0% of total REM sleep).  Sleep latency was normal at 5.0 minutes.  REM sleep latency was markedly increased at 239.0 minutes. Of the total sleep time, the percentage of stage N1 sleep was 14.3%, which is increased, stage N2 sleep was 77%, which is increased, stage N3 sleep was absent, and REM sleep was 8.7%, which is reduced. Wake after sleep onset (WASO) time accounted for 187 minutes with moderate to severe sleep fragmentation noted.    RESPIRATORY MONITORING:   Based on CMS criteria (using a 4% oxygen desaturation rule for scoring hypopneas), there were 4 apneas (4 obstructive; 0 central; 0 mixed), and 22 hypopneas.  Apnea index was 1.1. Hypopnea index was 6.2. The apnea-hypopnea index was 7.3/hour overall (0.0 supine, 19 non-supine; 19.5 REM, 0.0 supine REM).  There were 0 respiratory effort-related arousals (RERAs).  The RERA index was 0 events/h. Total respiratory disturbance index (RDI) was 7.3 events/h. RDI results showed: supine RDI  0.0 /h; non-supine RDI 7.4 /h; REM RDI 19.5 /h, supine REM RDI 0.0 /h.   Based on AASM criteria (using a 3% oxygen desaturation and /or arousal rule for scoring hypopneas), there were 4 apneas (4 obstructive; 0 central; 0 mixed), and 23 hypopneas. Apnea index was 1.1. Hypopnea index was 6.5. The apnea-hypopnea index was 7.6  overall (0.0 supine, 19 non-supine; 19.5 REM, 0.0 supine REM).  There were 0 respiratory effort-related arousals (RERAs).  The RERA index was 0 events/h. Total respiratory disturbance index (RDI) was 7.6 events/h. RDI results showed: supine RDI  0.0 /h; non-supine RDI 7.7 /h; REM RDI 19.5 /h, supine REM RDI 0.0 /h.  OXIMETRY: Oxyhemoglobin Saturation Nadir during sleep was at  73%) from a mean of 92%.  Of the Total  sleep time (TST)   hypoxemia (=<88%) was present for  37.2 minutes, or 17.5% of total sleep time, which is likely overestimated, due to errors in the O2 sensors, mostly during wakefulness, likely due to dislodging or movement.  LIMB MOVEMENTS: There were 314 periodic limb movements of sleep (88.5/hr), of which 133 (37.5/hr) were associated with an arousal.   AROUSAL: There were 166 arousals in total, for an arousal index of 47 arousals/hour.  Of these, 15 were identified as respiratory-related arousals (4 /h), 133 were PLM-related arousals (37 /h), and 18 were non-specific arousals (5 /h).     EEG: Review of the EEG showed no abnormal electrical discharges and symmetrical bihemispheric findings.    EKG: The EKG revealed an abnormal rhythm, in keeping with atrial fibrillation and interspersed PVCs. The average heart rate during sleep was 108 bpm.   AUDIO/VIDEO REVIEW: The audio and video review did not show any abnormal or unusual behaviors, movements, phonations or vocalizations. He was restless with numerous position changes noted. The patient took 4 restroom breaks. Snoring was noted, in the mild range.  POST-STUDY QUESTIONNAIRE: Post study, the patient indicated, that sleep was worse than usual.   IMPRESSION:  1. Obstructive Sleep Apnea (OSA) 2. 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 Dysfunctions associated with sleep stages or arousal from sleep 4. Non-specific abnormal electrocardiogram (EKG)  RECOMMENDATIONS:    1. This study demonstrates overall mild obstructive sleep apnea. The reduced sleep efficiency, poor sleep consolidation and reduced percentage of REM sleep during this study may have led to an underestimation of his sleep disordered breathing. His total AHI was 7.3/hour, O2 nadir was 73%. Given the patient's medical history and sleep related complaints, treatment with positive airway pressure is recommended; this can be achieved in the form of autoPAP. A full-night  CPAP titration study would allow optimization of therapy if needed. Other treatment options may include avoidance of supine sleep position along with weight loss, or the use of an oral appliance in selected patients. Please note, that untreated obstructive sleep apnea may carry additional perioperative morbidity. Patients with significant obstructive sleep apnea should receive perioperative PAP therapy and the surgeons and particularly the anesthesiologist should be informed of the diagnosis and the severity of the sleep disordered breathing. 2. Severe PLMs (periodic limb movements of sleep) were noted during this study with severe arousals; clinical correlation is recommended. Treatment of OSA may improve PLMs.  3. The study showed evidence of atrial fibrillation and PVCs on single lead EKG; clinical correlation is recommended. The patient will be advised to follow up with cardiology (no recent cardiology encounter seen in his electronic chart for the past 3-4 years from my review).  4. This study shows significant sleep fragmentation and abnormal sleep stage percentages; these are nonspecific findings and per se do not signify an intrinsic sleep disorder or a cause for the patient's sleep-related symptoms. Causes include (but are not limited to) the first night effect of the sleep study, circadian rhythm disturbances, medication effect or an underlying mood disorder or medical problem.  5. The patient should be cautioned not to drive, work at heights, or operate dangerous or heavy equipment when tired or sleepy. Review and reiteration of good sleep hygiene measures should be pursued with any patient. 6. The patient will be seen in follow-up by Dr. Frances Furbish at [facility] for discussion of the test results and further management strategies. The referring provider will be notified of the test results.   I certify that I have reviewed the entire raw data recording prior to the issuance of this report in accordance with  the Standards of Accreditation of the American Academy of Sleep Medicine (AASM).  Huston Foley, MD, PhD Medical Director, [facility]) Diplomat, ABPN (Neurology and Sleep)               Technical Report:   General Information  Name: Joel Payne, Joel Payne BMI: 33.05 Physician: Huston Foley, MD  ID: [id number] Height: 67.0 in Technician: Domingo Cocking, RPSGT  Sex: Male Weight: 211.0 lb Record: [id number]  Age: 78 [06/04/1945] Date: 02/23/2023    Medical & Medication History    Joel Payne is a 78 year old male with an underlying complex medical history of allergic rhinitis, PAF, on Xarelto, BPH, coronary artery disease, hypertension, hyperlipidemia, nephrolithiasis, history of TIA, history of stroke, and mild obesity, who reports snoring and excessive daytime somnolence. He has seen my colleague Dr. Pearlean Brownie in the past for stroke. He has never had a sleep study. His Epworth sleepiness score is 11 out of 24, fatigue severity score is 26 out of 63.  Ventolin HFA, Astelin, Vitamin B12, Atrovent, Klor-Con, XYZal, Lopressor, Pravachol, Xarelto, Diovan-HCT   Sleep Disorder      Comments   Patient arrived for a diagnostic polysomnogram. Procedure explained and all questions answered. Standard paste setup without complications. Patient was quite restless during study with arousals, awakenings, and changing positions often. Patient slept supine, left, and right. Mild snoring was noted. Respiratory events observed, primarily while in REM sleep. Cardiac arrhythmias observed. Patient has a known cardiac history. PLMS observed. Four restroom visits.    Lights out: 09:58:32 PM Lights on: 04:43:27 AM   Time Total Supine Side Prone Upright  Recording (TRT) 6h 45.64m 0h 24.63m 6h 20.69m 0h 0.29m 0h 0.37m  Sleep (TST) 3h 33.29m 0h 2.20m 3h 31.64m 0h 0.6m 0h 0.78m   Latency N1 N2 N3 REM Onset Per. Slp. Eff.  Actual 0h 0.33m 0h 31.75m 0h 0.44m 3h 59.65m 0h 5.2m 0h 35.83m 52.59%    Stg Dur Wake N1 N2 N3 REM  Total 192.0 30.5 164.0 0.0 18.5  Supine 22.0 1.5 0.5 0.0 0.0  Side 169.5 29.0 163.5 0.0 18.5  Prone 0.0 0.0 0.0 0.0 0.0  Upright 0.5 0.0 0.0 0.0 0.0   Stg % Wake N1 N2 N3 REM  Total 47.4 14.3 77.0 0.0 8.7  Supine 5.4 0.7 0.2 0.0 0.0  Side 41.9 13.6 76.8 0.0 8.7  Prone 0.0 0.0 0.0 0.0 0.0  Upright 0.1 0.0 0.0 0.0 0.0     Apnea Summary Sub Supine Side Prone Upright  Total 4 Total 4 0 4 0 0    REM 2 0 2 0 0    NREM 2 0 2 0 0  Obs 4 REM 2 0 2 0 0    NREM 2 0 2 0 0  Mix 0 REM 0 0 0 0 0    NREM 0 0 0 0 0  Cen 0 REM 0 0 0 0 0    NREM 0  0 0 0 0   Rera Summary Sub Supine Side Prone Upright  Total 0 Total 0 0 0 0 0    REM 0 0 0 0 0    NREM 0 0 0 0 0   Hypopnea Summary Sub Supine Side Prone Upright  Total 23 Total 23 0 23 0 0    REM 4 0 4 0 0    NREM 19 0 19 0 0   4% Hypopnea Summary Sub Supine Side Prone Upright  Total (4%) 22 Total 22 0 22 0 0    REM 4 0 4 0 0    NREM 18 0 18 0 0     AHI Total Obs Mix Cen  7.61 Apnea 1.13 1.13 0.00 0.00   Hypopnea 6.48 -- -- --  7.32 Hypopnea (4%) 6.20 -- -- --    Total Supine Side Prone Upright  Position AHI 7.61 0.00 7.68 0.00 0.00  REM AHI 19.46   NREM AHI 6.48   Position RDI 7.61 0.00 7.68 0.00 0.00  REM RDI 19.46   NREM RDI 6.48    4% Hypopnea Total Supine Side Prone Upright  Position AHI (4%) 7.32 0.00 7.39 0.00 0.00  REM AHI (4%) 19.46   NREM AHI (4%) 6.17   Position RDI (4%) 7.32 0.00 7.39 0.00 0.00  REM RDI (4%) 19.46   NREM RDI (4%) 6.17    Desaturation Information Threshold: 2% <100% <90% <80% <70% <60% <50% <40%  Supine 16.0 2.0 0.0 0.0 0.0 0.0 0.0  Side 258.0 89.0 6.0 2.0 2.0 0.0 0.0  Prone 0.0 0.0 0.0 0.0 0.0 0.0 0.0  Upright 1.0 0.0 0.0 0.0 0.0 0.0 0.0  Total 275.0 91.0 6.0 2.0 2.0 0.0 0.0  Index 41.3 13.7 0.9 0.3 0.3 0.0 0.0   Threshold: 3% <100% <90% <80% <70% <60% <50% <40%  Supine 8.0 2.0 0.0 0.0 0.0 0.0 0.0  Side 180.0 87.0 6.0 2.0 2.0 0.0 0.0  Prone 0.0 0.0 0.0 0.0 0.0  0.0 0.0  Upright 1.0 0.0 0.0 0.0 0.0 0.0 0.0  Total 189.0 89.0 6.0 2.0 2.0 0.0 0.0  Index 28.4 13.3 0.9 0.3 0.3 0.0 0.0   Threshold: 4% <100% <90% <80% <70% <60% <50% <40%  Supine 4.0 2.0 0.0 0.0 0.0 0.0 0.0  Side 138.0 77.0 6.0 2.0 2.0 0.0 0.0  Prone 0.0 0.0 0.0 0.0 0.0 0.0 0.0  Upright 1.0 0.0 0.0 0.0 0.0 0.0 0.0  Total 143.0 79.0 6.0 2.0 2.0 0.0 0.0  Index 21.4 11.8 0.9 0.3 0.3 0.0 0.0   Threshold: 3% <100% <90% <80% <70% <60% <50% <40%  Supine 8 2 0 0 0 0 0  Side 180 87 6 2 2  0 0  Prone 0 0 0 0 0 0 0  Upright 1 0 0 0 0 0 0  Total 189 89 6 2 2  0 0   Awakening/Arousal Information # of Awakenings 30  Wake after sleep onset 187.46m  Wake after persistent sleep 163.72m   Arousal Assoc. Arousals Index  Apneas 3 0.8  Hypopneas 12 3.4  Leg Movements 151 42.5  Snore 0 0.0  PTT Arousals 0 0.0  Spontaneous 18 5.1  Total 182 51.3  Leg Movement Information PLMS LMs Index  Total LMs during PLMS 314 88.5  LMs w/ Microarousals 133 37.5   LM LMs Index  w/ Microarousal 18 5.1  w/ Awakening 11 3.1  w/ Resp Event 0 0.0  Spontaneous 13 3.7  Total 31 8.7  Desaturation threshold setting: 3% Minimum desaturation setting: 10 seconds SaO2 nadir: 56% The longest event was a 54 sec obstructive Hypopnea with a minimum SaO2 of 82%. The lowest SaO2 was 50% associated with a 17 sec obstructive Apnea. EKG Rates EKG Avg Max Min  Awake 111 130 92  Asleep 108 129 92  EKG Events: Tachycardia

## 2023-03-04 NOTE — Progress Notes (Signed)
 Patient's last name on technical report and physician interpretation for sleep study encounter from 02/23/23 was erroneously spelled 'Murry' instead of Teater. I meant to correct before signing report, but signed before correcting the spelling mistake. Safety Zone entry made for correction on 03/04/2023 at approx. 12:45.

## 2023-03-04 NOTE — Addendum Note (Signed)
 Addended by: Huston Foley on: 03/04/2023 12:44 PM   Modules accepted: Orders

## 2023-03-09 ENCOUNTER — Telehealth: Payer: Self-pay | Admitting: *Deleted

## 2023-03-09 NOTE — Telephone Encounter (Signed)
 Called pt and LVM asking for call back to discuss results. We will also try him again. Left office number in message.

## 2023-03-09 NOTE — Telephone Encounter (Signed)
-----   Message from Huston Foley sent at 03/04/2023 12:44 PM EST ----- Patient referred by PCP, seen by me on 10/15/22, diagnostic PSG on 02/23/23.    Please call and notify the patient that the recent sleep study showed evidence of mild sleep apnea. I recommend treatment in the form of an autoPAP machine at home, which means, that we don't have to bring him back for a second sleep study with CPAP, but will let him try an autoPAP machine at home, through a DME company (of his choice, or as per insurance requirement). The DME representative will educate him on how to use the machine, how to put the mask on, etc. I have placed an order in the chart. Please send referral, talk to patient, send report to referring MD. We will need a FU in sleep clinic for 10 weeks post-PAP set up, please arrange that with me or one of our NPs.  Also, he was noted to be quite restless with leg movements during sleep noted. These may improve with sleep apnea treatment.   Lastly: His EKG was abnormal, in keeping with a fib. We talked about his irregular heartbeat during our visit and he was encourage to see cardiology. I do not see a recent or pending visit with cardiology. I highly recommend he see cardiology again. He may need a referral from PCP for this. I recommend he get in touch with her ASAP.  Thanks,   Huston Foley, MD, PhD [facility])

## 2023-03-16 ENCOUNTER — Telehealth: Payer: Self-pay | Admitting: *Deleted

## 2023-03-16 NOTE — Telephone Encounter (Signed)
-----   Message from Huston Foley sent at 03/04/2023 12:44 PM EST ----- Patient referred by PCP, seen by me on 10/15/22, diagnostic PSG on 02/23/23.    Please call and notify the patient that the recent sleep study showed evidence of mild sleep apnea. I recommend treatment in the form of an autoPAP machine at home, which means, that we don't have to bring him back for a second sleep study with CPAP, but will let him try an autoPAP machine at home, through a DME company (of his choice, or as per insurance requirement). The DME representative will educate him on how to use the machine, how to put the mask on, etc. I have placed an order in the chart. Please send referral, talk to patient, send report to referring MD. We will need a FU in sleep clinic for 10 weeks post-PAP set up, please arrange that with me or one of our NPs.  Also, he was noted to be quite restless with leg movements during sleep noted. These may improve with sleep apnea treatment.   Lastly: His EKG was abnormal, in keeping with a fib. We talked about his irregular heartbeat during our visit and he was encourage to see cardiology. I do not see a recent or pending visit with cardiology. I highly recommend he see cardiology again. He may need a referral from PCP for this. I recommend he get in touch with her ASAP.  Thanks,   Huston Foley, MD, PhD [facility])

## 2023-03-16 NOTE — Telephone Encounter (Addendum)
 Spoke to pt and relayed sleep study results per below.   Mild OSA recommend autopap using 4 hr every night for insurance compliance.  DME [facility] [phone].  They will authorize thru insurance, call him and then set up if ok with pt.  See them in appt for machine/supplies and mask fit.  See back for insurance compliance 2-3 mon after using machine,   made appt 05-31-2023 at 0900 (ok mychart vv).  Pt verbalized understanding.  Also relayed importance about heart irregularity (afib) see pcp for cardiology referral asap. He verbalized understanding.  Community message sent to [facility].

## 2023-03-16 NOTE — Telephone Encounter (Signed)
 Zott, Hennie Duos, RN; [place], [place] Got it Thank You     Previous Messages    ----- Message ----- From: Guy Begin, RN Sent: 03/16/2023   3:02 PM EST To: Marlou Porch Zott Subject: new autopap user                              New order in epic for pt  Joel Payne. [facility] Male, 78 y.o., [birth year] MRN: [id number] Phone: [phone] (M)  Thank you.    Electronics engineer

## 2023-03-20 ENCOUNTER — Other Ambulatory Visit: Payer: Self-pay | Admitting: Internal Medicine

## 2023-03-30 DIAGNOSIS — G4733 Obstructive sleep apnea (adult) (pediatric): Secondary | ICD-10-CM | POA: Diagnosis not present

## 2023-04-07 ENCOUNTER — Ambulatory Visit: Payer: Medicare PPO | Admitting: Allergy

## 2023-04-07 ENCOUNTER — Other Ambulatory Visit: Payer: Self-pay

## 2023-04-07 ENCOUNTER — Encounter: Payer: Self-pay | Admitting: Allergy

## 2023-04-07 VITALS — BP 130/88 | HR 99 | Temp 98.0°F | Resp 17

## 2023-04-07 DIAGNOSIS — J309 Allergic rhinitis, unspecified: Secondary | ICD-10-CM

## 2023-04-07 DIAGNOSIS — J4520 Mild intermittent asthma, uncomplicated: Secondary | ICD-10-CM | POA: Diagnosis not present

## 2023-04-07 DIAGNOSIS — R0982 Postnasal drip: Secondary | ICD-10-CM

## 2023-04-07 MED ORDER — IPRATROPIUM BROMIDE 0.06 % NA SOLN: 2.0000 | Freq: Four times a day (QID) | NASAL | 5 refills | Status: DC | PRN

## 2023-04-07 NOTE — Progress Notes (Signed)
 Follow-up Note  RE: Joel Payne MRN: [id number] DOB: Jun 08, 1945 Date of Office Visit: 04/07/2023   History of present illness: Joel Payne is a 78 y.o. male presenting today for follow-up of allergic rhinitis, PND and reactive airway.  He was last seen in the office on 10/07/22 by myself.   Discussed the use of AI scribe software for clinical note transcription with the patient, who gave verbal consent to proceed.  He has been experiencing a sore throat and voice changes, including a 'raw' feeling and occasional loss of voice, for the past two weeks. These symptoms began with the start of the pollen season.  He has been using azelastine nasal spray, which he brought to the appointment, but it has not been effective in controlling his post-nasal drip. Last visit is was recommended to change to ipratropium nasal spray, but he does not have it and has not been using it. He takes Xyzal (levocetirizine) as needed for allergies, typically one tablet at night, which he finds effective in preventing nighttime drainage and aiding sleep.  He uses a Flovent inhaler twice daily for breathing issues, which has been stable, and he has not needed a rescue inhaler.   He recently started using a CPAP machine for mild sleep apnea, diagnosed about a month and a half ago. He notes the CPAP can cause nasal dryness.     Review of systems: 10pt ROS negative unless noted above in HPI   Past medical/social/surgical/family history have been reviewed and are unchanged unless specifically indicated below.  No changes  Medication List: Current Outpatient Medications  Medication Sig Dispense Refill   albuterol (VENTOLIN HFA) 108 (90 Base) MCG/ACT inhaler INHALE 2 PUFFS BY MOUTH EVERY 4 TO 6 HOURS AS NEEDED 8.5 each 1   azelastine (ASTELIN) 0.1 % nasal spray Place 2 sprays into both nostrils as directed. 2 sprays  each nostril 1 to 2 times a day as needed for nasal drainage control 30 mL 5   cyanocobalamin  (VITAMIN B12) 1000 MCG/ML injection INJECT 0.1 MLS INTO THE MUSCLE EVERY 30 DAYS. 3 mL 7   FLOVENT HFA 110 MCG/ACT inhaler Inhale 2 puffs into the lungs 2 (two) times daily.     KLOR-CON M20 20 MEQ tablet TAKE 1 TABLET BY MOUTH EVERY DAY 90 tablet 1   levocetirizine (XYZAL) 5 MG tablet Take 1 tablet (5 mg total) by mouth daily. 30 tablet 5   metoprolol tartrate (LOPRESSOR) 50 MG tablet TAKE 1 TABLET BY MOUTH TWICE A DAY 180 tablet 1   pravastatin (PRAVACHOL) 20 MG tablet Take 20 mg by mouth daily.     rivaroxaban (XARELTO) 20 MG TABS tablet Take 1 tablet (20 mg total) by mouth daily with supper. 90 tablet 1   SYRINGE-NEEDLE, DISP, 3 ML (B-D 3CC LUER-LOK SYR 25GX1") 25G X 1" 3 ML MISC USE FOR B12 INJECTIONS 100 each 3   valsartan-hydrochlorothiazide (DIOVAN-HCT) 160-25 MG tablet TAKE 1 TABLET BY MOUTH EVERY DAY 90 tablet 1   ipratropium (ATROVENT) 0.06 % nasal spray Place 2 sprays into both nostrils 4 (four) times daily as needed (nasal drainage). 15 mL 5   No current facility-administered medications for this visit.     Known medication allergies: Allergies  Allergen Reactions   Simvastatin Other (See Comments)    Leg cramps     Physical examination: Blood pressure 130/88, pulse 99, temperature 98 F (36.7 C), temperature source Temporal, resp. rate 17, SpO2 95%.  General: Alert, interactive, in no  acute distress. HEENT: PERRLA, TMs pearly gray, turbinates non-edematous without discharge, post-pharynx non erythematous. Neck: Supple without lymphadenopathy. Lungs: Clear to auscultation without wheezing, rhonchi or rales. {no increased work of breathing. CV: Normal S1, S2 without murmurs. Abdomen: Nondistended, nontender. Skin: Warm and dry, without lesions or rashes. Extremities:  No clubbing, cyanosis or edema. Neuro:   Grossly intact.  Diagnositics/Labs:  Spirometry: FEV1: 1.61L 60%, FVC: 2.57L 72% predicted.  This is stable since last study.   Assessment and plan: Allergic  rhinitis  Post nasal drip  - Continue avoidance measures for grasses, trees, indoor molds, outdoor molds, dust mites, and cockroach. - Continue Xyzal (levocetirizine) 5mg  tablet once daily as needed.   - Change Astelin to Atrovent nasal spray 2 sprays each nostril up to 3-4 times a day as needed for nasal drainage/drip control - You can use an extra dose of the antihistamine, if needed, for breakthrough symptoms.  - Consider nasal saline rinses 1-2 times daily to remove allergens from the nasal cavities as well as help with mucous clearance (this is especially helpful to do before the nasal sprays are given)  Reactive airway, mild intermittent - Daily controller medication(s): Fluticasone (Flovent) 2 puffs once daily.  Rinse mouth after use. - Rescue medications: albuterol 2 puffs every 4-6 hours as needed  - Asthma control goals:  * Full participation in all desired activities (may need albuterol before activity) * Albuterol use two time or less a week on average (not counting use with activity) * Cough interfering with sleep two time or less a month * Oral steroids no more than once a year * No hospitalizations  Follow-up 6 months or sooner if needed  I appreciate the opportunity to take part in Rande's care. Please do not hesitate to contact me with questions.  Sincerely,   Margo Aye, MD Allergy/Immunology [facility]

## 2023-04-07 NOTE — Patient Instructions (Addendum)
-   Continue avoidance measures for grasses, trees, indoor molds, outdoor molds, dust mites, and cockroach. - Continue Xyzal (levocetirizine) 5mg  tablet once daily as needed.   - Change Astelin to Atrovent nasal spray 2 sprays each nostril up to 3-4 times a day as needed for nasal drainage/drip control - You can use an extra dose of the antihistamine, if needed, for breakthrough symptoms.  - Consider nasal saline rinses 1-2 times daily to remove allergens from the nasal cavities as well as help with mucous clearance (this is especially helpful to do before the nasal sprays are given)  - Daily controller medication(s): Fluticasone (Flovent) 2 puffs once daily.  Rinse mouth after use. - Rescue medications: albuterol 2 puffs every 4-6 hours as needed  - Asthma control goals:  * Full participation in all desired activities (may need albuterol before activity) * Albuterol use two time or less a week on average (not counting use with activity) * Cough interfering with sleep two time or less a month * Oral steroids no more than once a year * No hospitalizations  Follow-up 6 months or sooner if needed

## 2023-04-08 ENCOUNTER — Encounter: Payer: Self-pay | Admitting: Internal Medicine

## 2023-04-08 ENCOUNTER — Ambulatory Visit (INDEPENDENT_AMBULATORY_CARE_PROVIDER_SITE_OTHER): Payer: Medicare PPO | Admitting: Internal Medicine

## 2023-04-08 VITALS — BP 128/78 | HR 56 | Temp 97.6°F | Ht 67.5 in | Wt 211.4 lb

## 2023-04-08 DIAGNOSIS — I480 Paroxysmal atrial fibrillation: Secondary | ICD-10-CM | POA: Diagnosis not present

## 2023-04-08 DIAGNOSIS — E876 Hypokalemia: Secondary | ICD-10-CM | POA: Diagnosis not present

## 2023-04-08 DIAGNOSIS — N401 Enlarged prostate with lower urinary tract symptoms: Secondary | ICD-10-CM

## 2023-04-08 DIAGNOSIS — E538 Deficiency of other specified B group vitamins: Secondary | ICD-10-CM | POA: Diagnosis not present

## 2023-04-08 DIAGNOSIS — R5383 Other fatigue: Secondary | ICD-10-CM | POA: Diagnosis not present

## 2023-04-08 DIAGNOSIS — Z0000 Encounter for general adult medical examination without abnormal findings: Secondary | ICD-10-CM

## 2023-04-08 DIAGNOSIS — E559 Vitamin D deficiency, unspecified: Secondary | ICD-10-CM

## 2023-04-08 DIAGNOSIS — I10 Essential (primary) hypertension: Secondary | ICD-10-CM

## 2023-04-08 DIAGNOSIS — E785 Hyperlipidemia, unspecified: Secondary | ICD-10-CM | POA: Diagnosis not present

## 2023-04-08 DIAGNOSIS — R351 Nocturia: Secondary | ICD-10-CM | POA: Diagnosis not present

## 2023-04-08 DIAGNOSIS — L309 Dermatitis, unspecified: Secondary | ICD-10-CM

## 2023-04-08 LAB — LIPID PANEL
Cholesterol: 175 mg/dL (ref 0–200)
HDL: 35.7 mg/dL — ABNORMAL LOW (ref >39.00)
LDL Cholesterol: 115 mg/dL — ABNORMAL HIGH (ref 0–99)
NonHDL: 139.03
Total CHOL/HDL Ratio: 5
Triglycerides: 120 mg/dL (ref 0.0–149.0)
VLDL: 24 mg/dL (ref 0.0–40.0)

## 2023-04-08 LAB — COMPREHENSIVE METABOLIC PANEL WITH GFR
ALT: 18 U/L (ref 0–53)
AST: 17 U/L (ref 0–37)
Albumin: 4.4 g/dL (ref 3.5–5.2)
Alkaline Phosphatase: 50 U/L (ref 39–117)
BUN: 16 mg/dL (ref 6–23)
CO2: 30 meq/L (ref 19–32)
Calcium: 9.4 mg/dL (ref 8.4–10.5)
Chloride: 103 meq/L (ref 96–112)
Creatinine, Ser: 0.9 mg/dL (ref 0.40–1.50)
GFR: 82.08 mL/min (ref >60.00)
Glucose, Bld: 102 mg/dL — ABNORMAL HIGH (ref 70–99)
Potassium: 3.9 meq/L (ref 3.5–5.1)
Sodium: 142 meq/L (ref 135–145)
Total Bilirubin: 0.9 mg/dL (ref 0.2–1.2)
Total Protein: 7 g/dL (ref 6.0–8.3)

## 2023-04-08 LAB — CBC WITH DIFFERENTIAL/PLATELET
Basophils Absolute: 0 10*3/uL (ref 0.0–0.1)
Basophils Relative: 0.8 % (ref 0.0–3.0)
Eosinophils Absolute: 0.3 10*3/uL (ref 0.0–0.7)
Eosinophils Relative: 5.1 % — ABNORMAL HIGH (ref 0.0–5.0)
HCT: 47.5 % (ref 39.0–52.0)
Hemoglobin: 15.9 g/dL (ref 13.0–17.0)
Lymphocytes Relative: 21.4 % (ref 12.0–46.0)
Lymphs Abs: 1.3 10*3/uL (ref 0.7–4.0)
MCHC: 33.5 g/dL (ref 30.0–36.0)
MCV: 87.1 fl (ref 78.0–100.0)
Monocytes Absolute: 0.6 10*3/uL (ref 0.1–1.0)
Monocytes Relative: 10.5 % (ref 3.0–12.0)
Neutro Abs: 3.9 10*3/uL (ref 1.4–7.7)
Neutrophils Relative %: 62.2 % (ref 43.0–77.0)
Platelets: 184 10*3/uL (ref 150.0–400.0)
RBC: 5.45 Mil/uL (ref 4.22–5.81)
RDW: 14 % (ref 11.5–15.5)
WBC: 6.2 10*3/uL (ref 4.0–10.5)

## 2023-04-08 MED ORDER — BD LUER-LOK SYRINGE 25G X 1" 3 ML MISC: 3 refills | Status: DC

## 2023-04-08 MED ORDER — TRIAMCINOLONE ACETONIDE 0.1 % EX CREA: 1.0000 | TOPICAL_CREAM | Freq: Two times a day (BID) | CUTANEOUS | 2 refills | Status: DC

## 2023-04-08 NOTE — Progress Notes (Signed)
 Established Patient Office Visit     CC/Reason for Visit: Annual preventive exam  HPI: Joel Payne is a 78 y.o. male who is coming in today for the above mentioned reasons. Past Medical History is significant for: Hypertension, hyperlipidemia, A-fib, history of BPH.  He has been feeling a little more fatigued.  He would like to see a cardiologist as he has never seen one for his atrial fibrillation.  He has been having some dry, erythematous skin over the dorsum of his feet.  He has routine eye and dental care.  All immunizations are up-to-date.  His last colonoscopy was in 2014 and prefers to do no further due to his age.   Past Medical/Surgical History: Past Medical History:  Diagnosis Date   ALLERGIC RHINITIS 11/08/2006   Allergy 2023   BENIGN PROSTATIC HYPERTROPHY 10/12/2006   CORONARY ARTERY DISEASE 10/12/2006   Catheterization was normal 2012, mild nonobstructive coronary disease, normal LV function   HYPERLIPIDEMIA 11/08/2006   HYPERTENSION 10/12/2006   Kidney stone    in 1980   NEPHROLITHIASIS, HX OF 10/12/2006   PPD positive    Shortness of breath    April, 2012  /  catheterization May, 2012 mild nonobstructive coronary disease   Stroke [facility])    TIA (transient ischemic attack) 10/2015   Urinary frequency 10/18/2009    Past Surgical History:  Procedure Laterality Date   CARDIAC CATHETERIZATION  2012   non radical prostatectomy  08/2011   PROSTATE BIOPSY  2008   TEE WITHOUT CARDIOVERSION N/A 10/31/2015   Procedure: TRANSESOPHAGEAL ECHOCARDIOGRAM (TEE) will alos have loop;  Surgeon: Wendall Stade, MD;  Location: [facility] ENDOSCOPY;  Service: Cardiovascular;  Laterality: N/A;   TONSILLECTOMY      Social History:  reports that he has never smoked. He has never used smokeless tobacco. He reports that he does not currently use alcohol after a past usage of about 1.0 standard drink of alcohol per week. He reports that he does not use drugs.  Allergies: Allergies   Allergen Reactions   Simvastatin Other (See Comments)    Leg cramps    Family History:  Family History  Problem Relation Age of Onset   Heart attack Mother    Obesity Father    Cancer Brother    ADD / ADHD Son    Alcohol abuse Brother    Cancer Brother    Hypertension Brother    Obesity Brother    Hypertension Brother    Colon cancer Neg Hx    Esophageal cancer Neg Hx    Rectal cancer Neg Hx    Stomach cancer Neg Hx      Current Outpatient Medications:    azelastine (ASTELIN) 0.1 % nasal spray, Place 2 sprays into both nostrils as directed. 2 sprays  each nostril 1 to 2 times a day as needed for nasal drainage control, Disp: 30 mL, Rfl: 5   cyanocobalamin (VITAMIN B12) 1000 MCG/ML injection, INJECT 0.1 MLS INTO THE MUSCLE EVERY 30 DAYS., Disp: 3 mL, Rfl: 7   FLOVENT HFA 110 MCG/ACT inhaler, Inhale 2 puffs into the lungs 2 (two) times daily., Disp: , Rfl:    ipratropium (ATROVENT) 0.06 % nasal spray, Place 2 sprays into both nostrils 4 (four) times daily as needed (nasal drainage)., Disp: 15 mL, Rfl: 5   KLOR-CON M20 20 MEQ tablet, TAKE 1 TABLET BY MOUTH EVERY DAY, Disp: 90 tablet, Rfl: 1   levocetirizine (XYZAL) 5 MG tablet, Take 1 tablet (  5 mg total) by mouth daily., Disp: 30 tablet, Rfl: 5   metoprolol tartrate (LOPRESSOR) 50 MG tablet, TAKE 1 TABLET BY MOUTH TWICE A DAY, Disp: 180 tablet, Rfl: 1   pravastatin (PRAVACHOL) 20 MG tablet, Take 20 mg by mouth daily., Disp: , Rfl:    rivaroxaban (XARELTO) 20 MG TABS tablet, Take 1 tablet (20 mg total) by mouth daily with supper., Disp: 90 tablet, Rfl: 1   triamcinolone cream (KENALOG) 0.1 %, Apply 1 Application topically 2 (two) times daily., Disp: 30 g, Rfl: 2   valsartan-hydrochlorothiazide (DIOVAN-HCT) 160-25 MG tablet, TAKE 1 TABLET BY MOUTH EVERY DAY, Disp: 90 tablet, Rfl: 1   albuterol (VENTOLIN HFA) 108 (90 Base) MCG/ACT inhaler, INHALE 2 PUFFS BY MOUTH EVERY 4 TO 6 HOURS AS NEEDED (Patient not taking: Reported on  04/08/2023), Disp: 8.5 each, Rfl: 1   SYRINGE-NEEDLE, DISP, 3 ML (B-D 3CC LUER-LOK SYR 25GX1") 25G X 1" 3 ML MISC, USE FOR B12 INJECTIONS, Disp: 100 each, Rfl: 3  Review of Systems:  Negative unless indicated in HPI.   Physical Exam: Vitals:   04/08/23 0912 04/08/23 0918  BP: (!) 128/90 128/78  Pulse: (!) 56   Temp: 97.6 F (36.4 C)   TempSrc: Oral   SpO2: 98%   Weight: 211 lb 6.4 oz (95.9 kg)   Height: 5' 7.5" (1.715 m)     Body mass index is 32.62 kg/m.   Physical Exam Vitals reviewed.  Constitutional:      General: He is not in acute distress.    Appearance: Normal appearance. He is not ill-appearing, toxic-appearing or diaphoretic.  HENT:     Head: Normocephalic.     Right Ear: Tympanic membrane, ear canal and external ear normal. There is no impacted cerumen.     Left Ear: Tympanic membrane, ear canal and external ear normal. There is no impacted cerumen.     Nose: Nose normal.     Mouth/Throat:     Mouth: Mucous membranes are moist.     Pharynx: Oropharynx is clear. No oropharyngeal exudate or posterior oropharyngeal erythema.  Eyes:     General: No scleral icterus.       Right eye: No discharge.        Left eye: No discharge.     Conjunctiva/sclera: Conjunctivae normal.     Pupils: Pupils are equal, round, and reactive to light.  Neck:     Vascular: No carotid bruit.  Cardiovascular:     Rate and Rhythm: Normal rate and regular rhythm.     Pulses: Normal pulses.     Heart sounds: Normal heart sounds.  Pulmonary:     Effort: Pulmonary effort is normal. No respiratory distress.     Breath sounds: Normal breath sounds.  Abdominal:     General: Abdomen is flat. Bowel sounds are normal.     Palpations: Abdomen is soft.  Musculoskeletal:        General: Normal range of motion.     Cervical back: Normal range of motion.       Feet:  Feet:     Comments: Erythematous, scaly skin over the dorsum of both feet bilaterally.  Worse on left. Skin:    General:  Skin is warm and dry.  Neurological:     General: No focal deficit present.     Mental Status: He is alert and oriented to person, place, and time. Mental status is at baseline.  Psychiatric:        Mood and  Affect: Mood normal.        Behavior: Behavior normal.        Thought Content: Thought content normal.        Judgment: Judgment normal.      Impression and Plan:  Encounter for preventive health examination  Paroxysmal atrial fibrillation (HCC) -     Ambulatory referral to Cardiology  Essential hypertension -     CBC with Differential/Platelet; Future -     Comprehensive metabolic panel with GFR; Future  Vitamin B12 deficiency -     Vitamin B12; Future -     BD Luer-Lok Syringe; USE FOR B12 INJECTIONS  Dispense: 100 each; Refill: 3  Hypokalemia  Dyslipidemia -     Lipid panel; Future  Benign prostatic hyperplasia with nocturia -     PSA; Future  Vitamin D deficiency -     VITAMIN D 25 Hydroxy (Vit-D Deficiency, Fractures); Future  Fatigue, unspecified type -     TSH; Future  Eczema, unspecified type -     Triamcinolone Acetonide; Apply 1 Application topically 2 (two) times daily.  Dispense: 30 g; Refill: 2     -Recommend routine eye and dental care. -Healthy lifestyle discussed in detail. -Labs to be updated today. -Prostate cancer screening: PSA today Health Maintenance  Topic Date Due   COVID-19 Vaccine (8 - 2024-25 season) 05/11/2023   Medicare Annual Wellness Visit  02/09/2024   DTaP/Tdap/Td vaccine (3 - Td or Tdap) 08/25/2028   Pneumonia Vaccine  Completed   Flu Shot  Completed   Hepatitis C Screening  Completed   Zoster (Shingles) Vaccine  Completed   HPV Vaccine  Aged Out   Colon Cancer Screening  Discontinued    -Referral to cardiology placed. -Triamcinolone cream for eczema of his feet.    Chaya Jan, MD [facility]

## 2023-04-09 LAB — VITAMIN B12: Vitamin B-12: 292 pg/mL (ref 211–911)

## 2023-04-09 LAB — VITAMIN D 25 HYDROXY (VIT D DEFICIENCY, FRACTURES): VITD: 24.34 ng/mL — ABNORMAL LOW (ref 30.00–100.00)

## 2023-04-09 LAB — PSA: PSA: 0.52 ng/mL (ref 0.10–4.00)

## 2023-04-09 LAB — TSH: TSH: 2.42 u[IU]/mL (ref 0.35–5.50)

## 2023-04-12 ENCOUNTER — Encounter: Payer: Self-pay | Admitting: Internal Medicine

## 2023-04-12 ENCOUNTER — Other Ambulatory Visit: Payer: Self-pay | Admitting: Internal Medicine

## 2023-04-12 DIAGNOSIS — E559 Vitamin D deficiency, unspecified: Secondary | ICD-10-CM

## 2023-04-12 MED ORDER — VITAMIN D (ERGOCALCIFEROL) 1.25 MG (50000 UNIT) PO CAPS: 50000.0000 [IU] | ORAL_CAPSULE | ORAL | 0 refills | Status: AC

## 2023-04-14 ENCOUNTER — Other Ambulatory Visit: Payer: Self-pay | Admitting: Internal Medicine

## 2023-04-14 ENCOUNTER — Other Ambulatory Visit (HOSPITAL_COMMUNITY): Payer: Self-pay

## 2023-04-14 MED ORDER — FLUTICASONE PROPIONATE HFA 110 MCG/ACT IN AERO
2.0000 | INHALATION_SPRAY | Freq: Two times a day (BID) | RESPIRATORY_TRACT | 2 refills | Status: DC
  Filled 2023-04-14: qty 12, 30d supply, fill #0
  Filled 2023-05-14: qty 12, 30d supply, fill #1
  Filled 2023-07-12: qty 12, 30d supply, fill #2

## 2023-04-19 ENCOUNTER — Other Ambulatory Visit: Payer: Self-pay | Admitting: Internal Medicine

## 2023-04-19 DIAGNOSIS — E876 Hypokalemia: Secondary | ICD-10-CM

## 2023-04-30 DIAGNOSIS — G4733 Obstructive sleep apnea (adult) (pediatric): Secondary | ICD-10-CM | POA: Diagnosis not present

## 2023-05-02 ENCOUNTER — Other Ambulatory Visit: Payer: Self-pay | Admitting: Internal Medicine

## 2023-05-02 DIAGNOSIS — I10 Essential (primary) hypertension: Secondary | ICD-10-CM

## 2023-05-06 ENCOUNTER — Encounter: Payer: Self-pay | Admitting: Allergy

## 2023-05-06 MED ORDER — LEVOCETIRIZINE DIHYDROCHLORIDE 5 MG PO TABS: 5.0000 mg | ORAL_TABLET | Freq: Every day | ORAL | 5 refills | Status: DC

## 2023-05-14 ENCOUNTER — Other Ambulatory Visit (HOSPITAL_COMMUNITY): Payer: Self-pay

## 2023-05-27 ENCOUNTER — Encounter: Payer: Self-pay | Admitting: *Deleted

## 2023-05-27 NOTE — Progress Notes (Signed)
   PATIENT: Joel Payne DOB: August 06, 1945  REASON FOR VISIT: follow up HISTORY FROM: patient  Virtual Visit via MyChart video  I connected with Chana Comas on 05/31/23 at  9:00 AM EDT via MyChart video and verified that I am speaking with the correct person using two identifiers.   I discussed the limitations, risks, security and privacy concerns of performing an evaluation and management service by Mychart video and the availability of in person appointments. I also discussed with the patient that there may be a patient responsible charge related to this service. The patient expressed understanding and agreed to proceed.   History of Present Illness:  05/31/23 ALL (MyChart): XANDER JUTRAS is a 78 y.o. male presents for follow up of recently diagnosed OSA on CPAP. He was seen in consult with Dr Omar Bibber 02/2023 for concerns of sleep apnea. Seen in 2017 by Dr Janett Medin s/p CVA. PSG 02/2023 showed overall mild obstructive sleep apnea. AHI 7.3/hr. The reduced sleep efficiency, poor sleep consolidation and reduced percentage of REM sleep during this study may have led to an underestimation of his sleep disordered breathing. His total AHI was 7.3/hour, O2 nadir was 73%. AutoPAP advised. Since, he reports adjusting well to therapy. He is using a FFM. He feels that he is waking up feeling better rested more than he was. No significant concerns with mask. He does have nasal congestion treated by his allergist. Severe PLMs noted on PSG. He reports that this has noted significant improvement in restless leg symptoms. He is sleeping well. He continues Eliquis  and pravastatin .      Observations/Objective:  Generalized: Well developed, in no acute distress  Mentation: Alert oriented to time, place, history taking. Follows all commands speech and language fluent   Assessment and Plan:  78 y.o. year old male  has a past medical history of ALLERGIC RHINITIS (11/08/2006), Allergy  (2023), BENIGN PROSTATIC  HYPERTROPHY (10/12/2006), CORONARY ARTERY DISEASE (10/12/2006), HYPERLIPIDEMIA (11/08/2006), HYPERTENSION (10/12/2006), Kidney stone, NEPHROLITHIASIS, HX OF (10/12/2006), PPD positive, Shortness of breath, Stroke (HCC), TIA (transient ischemic attack) (10/2015), and Urinary frequency (10/18/2009). here with    ICD-10-CM   1. OSA on CPAP  G47.33      Mr Nosbisch is doing very well with autoPAP therapy. Compliance report shows excellent compliance. He was encouraged to continue using therapy nightly for at least 4 hours. He was encouraged to continue pravastatin  and Eliquis  per care team for stroke prevention. Healthy lifestyle habits advised. He will follow up with me in 1 year, sooner if needed.   No orders of the defined types were placed in this encounter.   No orders of the defined types were placed in this encounter.    Follow Up Instructions:  I discussed the assessment and treatment plan with the patient. The patient was provided an opportunity to ask questions and all were answered. The patient agreed with the plan and demonstrated an understanding of the instructions.   The patient was advised to call back or seek an in-person evaluation if the symptoms worsen or if the condition fails to improve as anticipated.  I provided 15 minutes of face-to-face and non face-to-face time during this MyChart video encounter. Patient located at their place of residence. Provider is in the office.    Oval Cavazos, NP

## 2023-05-27 NOTE — Progress Notes (Signed)
 Joel Payne

## 2023-05-28 NOTE — Patient Instructions (Signed)

## 2023-05-30 DIAGNOSIS — G4733 Obstructive sleep apnea (adult) (pediatric): Secondary | ICD-10-CM | POA: Diagnosis not present

## 2023-05-31 ENCOUNTER — Telehealth: Admitting: Family Medicine

## 2023-05-31 ENCOUNTER — Encounter: Payer: Self-pay | Admitting: Family Medicine

## 2023-05-31 DIAGNOSIS — G4733 Obstructive sleep apnea (adult) (pediatric): Secondary | ICD-10-CM | POA: Diagnosis not present

## 2023-06-27 ENCOUNTER — Other Ambulatory Visit: Payer: Self-pay | Admitting: Internal Medicine

## 2023-06-27 DIAGNOSIS — E559 Vitamin D deficiency, unspecified: Secondary | ICD-10-CM

## 2023-06-28 ENCOUNTER — Encounter: Payer: Self-pay | Admitting: Internal Medicine

## 2023-06-30 DIAGNOSIS — G4733 Obstructive sleep apnea (adult) (pediatric): Secondary | ICD-10-CM | POA: Diagnosis not present

## 2023-07-01 ENCOUNTER — Other Ambulatory Visit (INDEPENDENT_AMBULATORY_CARE_PROVIDER_SITE_OTHER)

## 2023-07-01 ENCOUNTER — Encounter: Payer: Self-pay | Admitting: Internal Medicine

## 2023-07-01 DIAGNOSIS — E559 Vitamin D deficiency, unspecified: Secondary | ICD-10-CM | POA: Diagnosis not present

## 2023-07-01 LAB — VITAMIN D 25 HYDROXY (VIT D DEFICIENCY, FRACTURES): VITD: 48.29 ng/mL (ref 30.00–100.00)

## 2023-07-06 ENCOUNTER — Ambulatory Visit: Payer: Self-pay | Admitting: Internal Medicine

## 2023-07-07 ENCOUNTER — Other Ambulatory Visit: Payer: Self-pay | Admitting: Internal Medicine

## 2023-07-07 DIAGNOSIS — I10 Essential (primary) hypertension: Secondary | ICD-10-CM

## 2023-07-12 ENCOUNTER — Other Ambulatory Visit (HOSPITAL_BASED_OUTPATIENT_CLINIC_OR_DEPARTMENT_OTHER): Payer: Self-pay

## 2023-07-12 DIAGNOSIS — G4733 Obstructive sleep apnea (adult) (pediatric): Secondary | ICD-10-CM | POA: Diagnosis not present

## 2023-07-12 DIAGNOSIS — R400 Somnolence: Secondary | ICD-10-CM | POA: Diagnosis not present

## 2023-07-17 ENCOUNTER — Encounter: Payer: Self-pay | Admitting: Internal Medicine

## 2023-07-17 DIAGNOSIS — I480 Paroxysmal atrial fibrillation: Secondary | ICD-10-CM

## 2023-07-19 ENCOUNTER — Other Ambulatory Visit: Payer: Self-pay | Admitting: Internal Medicine

## 2023-07-19 DIAGNOSIS — I480 Paroxysmal atrial fibrillation: Secondary | ICD-10-CM

## 2023-07-20 MED ORDER — RIVAROXABAN 20 MG PO TABS: 20.0000 mg | ORAL_TABLET | Freq: Every day | ORAL | 1 refills | Status: DC

## 2023-07-22 ENCOUNTER — Encounter: Payer: Self-pay | Admitting: Internal Medicine

## 2023-07-27 ENCOUNTER — Ambulatory Visit: Admitting: Internal Medicine

## 2023-07-27 VITALS — BP 110/80 | HR 88 | Temp 97.4°F | Wt 211.3 lb

## 2023-07-27 DIAGNOSIS — L309 Dermatitis, unspecified: Secondary | ICD-10-CM | POA: Diagnosis not present

## 2023-07-27 MED ORDER — TRIAMCINOLONE ACETONIDE 0.1 % EX CREA: 1.0000 | TOPICAL_CREAM | Freq: Three times a day (TID) | CUTANEOUS | 0 refills | Status: DC

## 2023-07-27 NOTE — Progress Notes (Signed)
 Established Patient Office Visit     CC/Reason for Visit: Discoloration and itching on dorsum of left foot  HPI: Joel Payne is a 78 y.o. male who is coming in today for the above mentioned reasons.  Has been present for a few months.  No new lotions or detergents that he can think of.   Past Medical/Surgical History: Past Medical History:  Diagnosis Date   ALLERGIC RHINITIS 11/08/2006   Allergy  2023   BENIGN PROSTATIC HYPERTROPHY 10/12/2006   CORONARY ARTERY DISEASE 10/12/2006   Catheterization was normal 2012, mild nonobstructive coronary disease, normal LV function   HYPERLIPIDEMIA 11/08/2006   HYPERTENSION 10/12/2006   Kidney stone    in 1980   NEPHROLITHIASIS, HX OF 10/12/2006   PPD positive    Shortness of breath    April, 2012  /  catheterization May, 2012 mild nonobstructive coronary disease   Stroke [facility])    TIA (transient ischemic attack) 10/2015   Urinary frequency 10/18/2009    Past Surgical History:  Procedure Laterality Date   CARDIAC CATHETERIZATION  2012   non radical prostatectomy  08/2011   PROSTATE BIOPSY  2008   TEE WITHOUT CARDIOVERSION N/A 10/31/2015   Procedure: TRANSESOPHAGEAL ECHOCARDIOGRAM (TEE) will alos have loop;  Surgeon: Joel Joel Emmer, MD;  Location: [facility] ENDOSCOPY;  Service: Cardiovascular;  Laterality: N/A;   TONSILLECTOMY      Social History:  reports that he has never smoked. He has never used smokeless tobacco. He reports that he does not currently use alcohol after a past usage of about 1.0 standard drink of alcohol per week. He reports that he does not use drugs.  Allergies: Allergies  Allergen Reactions   Simvastatin  Other (See Comments)    Leg cramps    Family History:  Family History  Problem Relation Age of Onset   Heart attack Mother    Obesity Father    Cancer Brother    ADD / ADHD Son    Alcohol abuse Brother    Cancer Brother    Hypertension Brother    Obesity Brother    Hypertension Brother     Colon cancer Neg Hx    Esophageal cancer Neg Hx    Rectal cancer Neg Hx    Stomach cancer Neg Hx      Current Outpatient Medications:    cyanocobalamin  (VITAMIN B12) 1000 MCG/ML injection, INJECT 0.1 MLS INTO THE MUSCLE EVERY 30 DAYS., Disp: 3 mL, Rfl: 7   fluticasone  (FLOVENT  HFA) 110 MCG/ACT inhaler, Inhale 2 puffs into the lungs 2 (two) times daily., Disp: 12 g, Rfl: 2   ipratropium (ATROVENT ) 0.06 % nasal spray, Place 2 sprays into both nostrils 4 (four) times daily as needed (nasal drainage)., Disp: 15 mL, Rfl: 5   KLOR-CON  M20 20 MEQ tablet, TAKE 1 TABLET BY MOUTH EVERY DAY, Disp: 90 tablet, Rfl: 1   levocetirizine (XYZAL ) 5 MG tablet, Take 1 tablet (5 mg total) by mouth daily., Disp: 30 tablet, Rfl: 5   metoprolol  tartrate (LOPRESSOR ) 50 MG tablet, TAKE 1 TABLET BY MOUTH TWICE A DAY, Disp: 180 tablet, Rfl: 1   OVER THE COUNTER MEDICATION, Vitamin D3 1000 mcg take 2 cap daily, Disp: , Rfl:    pravastatin  (PRAVACHOL ) 20 MG tablet, TAKE 1 TABLET BY MOUTH EVERY DAY, Disp: 90 tablet, Rfl: 1   rivaroxaban  (XARELTO ) 20 MG TABS tablet, Take 1 tablet (20 mg total) by mouth daily with supper., Disp: 90 tablet, Rfl: 1   SYRINGE-NEEDLE,  DISP, 3 ML (B-D 3CC LUER-LOK SYR 25GX1) 25G X 1 3 ML MISC, USE FOR B12 INJECTIONS, Disp: 100 each, Rfl: 3   triamcinolone  cream (KENALOG ) 0.1 %, Apply 1 Application topically 3 (three) times daily., Disp: 453 g, Rfl: 0   valsartan -hydrochlorothiazide  (DIOVAN -HCT) 160-25 MG tablet, TAKE 1 TABLET BY MOUTH EVERY DAY, Disp: 90 tablet, Rfl: 1   albuterol  (VENTOLIN  HFA) 108 (90 Base) MCG/ACT inhaler, INHALE 2 PUFFS BY MOUTH EVERY 4 TO 6 HOURS AS NEEDED (Patient not taking: Reported on 04/08/2023), Disp: 8.5 each, Rfl: 1   azelastine  (ASTELIN ) 0.1 % nasal spray, Place 2 sprays into both nostrils as directed. 2 sprays  each nostril 1 to 2 times a day as needed for nasal drainage control, Disp: 30 mL, Rfl: 5  Review of Systems:  Negative unless indicated in  HPI.   Physical Exam: Vitals:   07/27/23 1428  BP: 110/80  Pulse: 88  Temp: (!) 97.4 F (36.3 C)  TempSrc: Oral  SpO2: 96%  Weight: 211 lb 4.8 oz (95.8 kg)    Body mass index is 32.61 kg/m.    Impression and Plan:  Eczema, unspecified type -     Triamcinolone  Acetonide; Apply 1 Application topically 3 (three) times daily.  Dispense: 453 g; Refill: 0  -This appears to be eczema on a substrate of very dry skin.  Have advised triamcinolone  cream covered by a moisturizing lotion such as Aquaphor healing ointment.  Follow-up if no improvement.   Time spent:22 minutes reviewing chart, interviewing and examining patient and formulating plan of care.     Joel Theophilus Andrews, MD [facility] at [facility]

## 2023-07-30 DIAGNOSIS — G4733 Obstructive sleep apnea (adult) (pediatric): Secondary | ICD-10-CM | POA: Diagnosis not present

## 2023-08-02 ENCOUNTER — Encounter: Payer: Self-pay | Admitting: Cardiology

## 2023-08-02 ENCOUNTER — Ambulatory Visit: Attending: Cardiology | Admitting: Cardiology

## 2023-08-02 ENCOUNTER — Ambulatory Visit

## 2023-08-02 VITALS — BP 140/82 | HR 94 | Ht 67.0 in | Wt 214.6 lb

## 2023-08-02 DIAGNOSIS — D6869 Other thrombophilia: Secondary | ICD-10-CM

## 2023-08-02 DIAGNOSIS — R0609 Other forms of dyspnea: Secondary | ICD-10-CM | POA: Diagnosis not present

## 2023-08-02 DIAGNOSIS — I482 Chronic atrial fibrillation, unspecified: Secondary | ICD-10-CM | POA: Diagnosis not present

## 2023-08-02 DIAGNOSIS — I10 Essential (primary) hypertension: Secondary | ICD-10-CM

## 2023-08-02 DIAGNOSIS — G459 Transient cerebral ischemic attack, unspecified: Secondary | ICD-10-CM

## 2023-08-02 DIAGNOSIS — I480 Paroxysmal atrial fibrillation: Secondary | ICD-10-CM

## 2023-08-02 DIAGNOSIS — G4733 Obstructive sleep apnea (adult) (pediatric): Secondary | ICD-10-CM

## 2023-08-02 DIAGNOSIS — Z7901 Long term (current) use of anticoagulants: Secondary | ICD-10-CM

## 2023-08-02 NOTE — Progress Notes (Unsigned)
 Cardiology Office Note:  .   Date:  08/02/2023  ID:  Joel Payne, DOB 10-Nov-1945, MRN [id number] PCP: Theophilus Andrews, Tully GRADE, MD  [facility] Providers Cardiologist:  None { Click to update primary MD,subspecialty MD or APP then REFRESH:1}    No chief complaint on file.   Patient Profile: .     Joel Payne is a mildly obese 78 y.o. male  with a  at the request of Theophilus Andrews, Estel*.  {There is no content from the last Narrative History section.}      Joel Payne was last seen on ***  Subjective  Discussed the use of AI scribe software for clinical note transcription with the patient, who gave verbal consent to proceed.  History of Present Illness      Cardiovascular ROS: {roscv:310661}  ROS:  Review of Systems - {ros master:310782}    Objective    Studies Reviewed: SABRA   EKG Interpretation Date/Time:  Monday August 02 2023 11:29:52 EDT Ventricular Rate:  94 PR Interval:    QRS Duration:  92 QT Interval:  354 QTC Calculation: 442 R Axis:   -12  Text Interpretation: Atrial fibrillation with premature ventricular or aberrantly conducted complexes Minimal voltage criteria for LVH, may be normal variant ( Cornell product ) T wave abnormality, consider lateral ischemia When compared with ECG of 05-Nov-2015 10:06, Atrial fibrillation has replaced Sinus rhythm Non-specific change in ST segment in Lateral leads T wave inversion now evident in Lateral leads Confirmed by Anner Lenis ([id number]) on 08/02/2023 11:50:44 AM    ECHO 10/2015: *** CATH 2012: mild non-obstructive CAD (20-30% LAD & LCx)    Risk Assessment/Calculations:    CHA2DS2-VASc Score = 6  {Click here to calculate score.  REFRESH note before signing. :1} This indicates a 9.7% annual risk of stroke. The patient's score is based upon: CHF History: 0 HTN History: 1 Diabetes History: 0 Stroke History: 2 Vascular Disease History: 1 Age Score: 2 Gender Score: 0   {This patient  has a significant risk of stroke if diagnosed with atrial fibrillation.  Please consider VKA or DOAC agent for anticoagulation if the bleeding risk is acceptable.   You can also use the SmartPhrase .[email or web address] for documentation.   :[id number]} The patient's 1st BP is elevated (>139/89)*** Repeat BP and {Click to enter a 2nd BP Refresh Note  :1}         Physical Exam:   VS:  BP (!) 140/82 (BP Location: Left Arm, Patient Position: Sitting, Cuff Size: Normal)   Pulse 94   Ht 5' 7 (1.702 m)   Wt 214 lb 9.6 oz (97.3 kg)   SpO2 93%   BMI 33.61 kg/m    Wt Readings from Last 3 Encounters:  08/02/23 214 lb 9.6 oz (97.3 kg)  07/27/23 211 lb 4.8 oz (95.8 kg)  04/08/23 211 lb 6.4 oz (95.9 kg)    GEN: Well nourished, well developed in no acute distress; *** NECK: No JVD; No carotid bruits CARDIAC: Normal S1, S2; RRR, no murmurs, rubs, gallops RESPIRATORY:  Clear to auscultation without rales, wheezing or rhonchi ; nonlabored, good air movement. ABDOMEN: Soft, non-tender, non-distended EXTREMITIES:  No edema; No deformity      ASSESSMENT AND PLAN: .    Problem List Items Addressed This Visit       Cardiology Problems   Atrial fibrillation (HCC)   Relevant Orders   EKG 12-Lead (Completed)   Essential hypertension - Primary   Relevant Orders  EKG 12-Lead (Completed)     Other   Chronic anticoagulation   DOE (dyspnea on exertion)    Assessment and Plan Assessment & Plan        {Are you ordering a CV Procedure (e.g. stress test, cath, DCCV, TEE, etc)?   Press F2        :[id number]}   Follow-Up: No follow-ups on file.  Total time spent: *** min spent with patient + *** min spent charting = *** min    Signed, Alm MICAEL Clay, MD, MS Alm Clay, M.D., M.S. Interventional Chartered certified accountant  Pager # [phone]

## 2023-08-02 NOTE — Progress Notes (Unsigned)
 Cardiology Office Note:  .   Date:  08/03/2023  ID:  CURT OATIS, DOB February 04, 1945, MRN [id number] PCP: Theophilus Andrews, Tully GRADE, MD  [facility] Providers Cardiologist:  Alm Clay, MD     Chief Complaint  Patient presents with   New Patient (Initial Visit)    Reestablish cardiology care.  Noting exertional dyspnea and fatigue   Atrial Fibrillation    Long history of A-fib diagnosed in 2017    Patient Profile: .     Joel Payne is a  78 y.o. male with a PMH notable for CAD, PAF, left PCA CVA, HTN HLD who presents here to reestablish cardiology care with complaint of worsening Exercise Intolerance/Fatigue As Well As Dyspnea on Exertion at the request of Theophilus Andrews, Estel*.  PMH notable for atrial fibrillation diagnosed in the setting of TIA in 2017, HTN, HLD and mild nonobstructive CAD by cath in 2012 who presents here   He was previously seen by Dr. Katz-last visit was Jun 05, 2010 for evaluation of dyspnea and abnormal nuclear stress test.  He had had a heart catheterization on May 19, 2010 which found out 20 to 30% LAD and LCx stenosis and normal LV function.   Joel Payne was last seen on 24th 2017 by Arland Don at the A-fib clinic.  With no relative elevated TAVR no neurologic issues or A-fib issues.  Was on his DOAC without any bleeding issues.  No significant alcohol use.  No chest pain or pressure.  Subjective  Discussed the use of AI scribe software for clinical note transcription with the patient, who gave verbal consent to proceed.  History of Present Illness History of Present Illness Joel Payne is a 78 year old male with atrial fibrillation who presents with worsening fatigue and dyspnea on exertion. He was referred by his primary care doctor for evaluation by a heart surgeon due to concerns about his atrial fibrillation and fatigue.  He has been experiencing increasing fatigue over the past couple of years, which has become more  pronounced during physical activities such as hauling garbage cans or walking. This fatigue has developed gradually and is not linked to a specific event or time frame.  He has a history of atrial fibrillation, diagnosed in 2017 following a transient ischemic attack (TIA). He is currently on Xarelto  20 mg daily, having switched from Eliquis  due to insurance formulary changes. He also takes metoprolol  50 mg twice daily, valsartan  HCTZ 160/25 mg, Pravachol  20 mg, a potassium supplement, and over-the-counter vitamin D3.  He experiences shortness of breath and heavy legs during exertion. No chest tightness or pressure, but occasionally feels a 'little pinprick' in his chest lasting only seconds. He does not experience shortness of breath when lying flat or waking up at night due to breathing difficulties.  He uses a CPAP machine, initiated six months ago due to snoring, as noted by his wife. He observes swelling in his feet during the day, which subsides at night when he elevates his feet. He has gained approximately ten pounds over the past two years, attributing this to a more sedentary lifestyle.  Cardiovascular ROS: positive for - dyspnea on exertion, edema, irregular heartbeat, and exercise intolerance/fatigue.  Also notes leg aching. negative for - chest pain, orthopnea, paroxysmal nocturnal dyspnea, rapid heart rate, shortness of breath, or lightheadedness, dizziness Odis, syncope or near syncope or TIA or speak excellent claudication  ROS:  Review of Systems - Negative except per HPI  Objective   Past Medical History - Atrial fibrillation diagnosed in the setting of TIA in 2017-history of cardioversion - History of TIA October 2017 - Hypertension - Hyperlipidemia - Mild non-obstructive coronary artery disease by catheterization in 2012 - Obstructive sleep apnea  Pertinent Surgical History: - Heart catheterization (2020 12): Performed to evaluate coronary artery disease-minimal CAD  with 20-30 % proximal LAD and proximal LCx disease. - Cardioversion (2017): Performed for atrial fibrillation  Medications - Xarelto  20 mg - Metoprolol  50 mg twice a day - Valsartan  HCTZ 160/12.5 mg - Pravachol  20 mg - Potassium supplement-Klor-Con  20 mg 1 daily - Vitamin D3 over the counter; vitamin B12 injections every 30 days - Triamcinolone  cream - Albuterol  inhaler as needed and Atrovent  2 sprays per nostril 4 times a day as needed; Flonase  inhaler 2 puffs twice daily; Xyzal  5 mg as needed . Studies Reviewed: SABRA   EKG Interpretation Date/Time:  Monday August 02 2023 11:29:52 EDT Ventricular Rate:  94 PR Interval:    QRS Duration:  92 QT Interval:  354 QTC Calculation: 442 R Axis:   -12  Text Interpretation: Atrial fibrillation with premature ventricular or aberrantly conducted complexes Minimal voltage criteria for LVH, may be normal variant ( Cornell product ) T wave abnormality, consider lateral ischemia When compared with ECG of 05-Nov-2015 10:06, Atrial fibrillation has replaced Sinus rhythm Non-specific change in ST segment in Lateral leads T wave inversion now evident in Lateral leads Confirmed by Anner Lenis ([id number]) on 08/02/2023 11:50:44 AM    Transthoracic Echocardiogram: (For TIA).  Mild concentric LVH.  Low normal EF 5055%.  Severe HK of the mid-apical inferolateral wall, and suggestion of focal apical akinesis but not well-visualized.  Suggested Definity  contrast study.  GR 2 DD.  Mildly calcified AoV with mild MR.  (10/29/2015) Transesophageal Echocardiogram: EF 60 to 65%.  Mild to moderate AI.  Mild MR.  Moderate LA dilation but no LAA thrombus.  No RA Promus.  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  Mild to moderate TR.  Went to A-fib RVR after esophageal intubation treated with IV Lopressor  and amiodarone . => Started on DOAC.  Left Heart Cath-Coronary Angiography: Minimal disease with 20 to 30% proximal LAD and proximal LCx stenosis  otherwise normal.  (May 19, 2010) TM Myoview  : No chest pain or EKG changes but did develop ventricular bigeminy with exertion.  Nuclear images suggestive of fixed inferior defect with some peri-infarct ischemia.  EF 56%.  (05/12/2010)   Risk Assessment/Calculations:    CHA2DS2-VASc Score = 6   This indicates a 9.7% annual risk of stroke. The patient's score is based upon: CHF History: 0 HTN History: 1 Diabetes History: 0 Stroke History: 2 Vascular Disease History: 1 Age Score: 2 Gender Score: 0             Physical Exam:   VS:  BP 138/78   Pulse 94   Ht 5' 7 (1.702 m)   Wt 214 lb 9.6 oz (97.3 kg)   SpO2 93%   BMI 33.61 kg/m    Wt Readings from Last 3 Encounters:  08/02/23 214 lb 9.6 oz (97.3 kg)  07/27/23 211 lb 4.8 oz (95.8 kg)  04/08/23 211 lb 6.4 oz (95.9 kg)    GEN: Well nourished, well groomed in no acute distress; mildly obese, otherwise healthy-appearing NECK: No JVD; No carotid bruits CARDIAC: Irregularly irregular rhythm with normal rate.; Normal S1 and S2; , no murmurs, rubs, gallops RESPIRATORY:  Clear  to auscultation without rales, wheezing or rhonchi ; nonlabored, good air movement. ABDOMEN: Soft, non-tender, non-distended EXTREMITIES:  No edema; No deformity     ASSESSMENT AND PLAN: .    Problem List Items Addressed This Visit       Cardiology Problems   Atrial fibrillation (HCC) (Chronic)   Currently in A-fib.  Cardiac unaware as far as irregular heartbeats, but has noted a couple months of worsening exertional dyspnea. Unclear if it is persistent or paroxysmal. Stroke risk managed with Xarelto , and rate control with Lopressor  50 mg twice daily  - Order 14-day Zio monitor to assess AFib burden. - Order echocardiogram to evaluate cardiac function. - Consider cardioversion if persistent AFib is confirmed. - Continue Xarelto  20 mg daily for CVA prophylaxis along with Lopressor  50 mg twice daily for rate control..      Relevant Orders   EKG  12-Lead (Completed)   LONG TERM MONITOR (3-14 DAYS)   ECHOCARDIOGRAM COMPLETE   Essential hypertension - Primary (Chronic)   Borderline elevated pressure today but better on recheck. - For now continue current dose of Lopressor  25 twice daily and valsartan -HCTZ 160-25 mg daily.  Low threshold to titrate ARB portion further.      Relevant Orders   EKG 12-Lead (Completed)   TIA (transient ischemic attack)   History of TIA in 2017 likely due to AFib. Stroke risk managed with Xarelto .        Other   DOE (dyspnea on exertion)   Previous ischemic valuations have been relatively normal despite abnormal stress test.  Cardiac catheterization back in 2012 showed minimal CAD and he does not actively have any angina.  He did have some abnormal findings on his echocardiogram at time of his TIA.  Most of his symptoms are exertional dyspnea with some edema.  Concern for possible HFpEF or possibly HFmrEF. - Check 2D echo to assess EF and wall motion. - If possible will consider cardioversion to reassess symptoms.  If symptoms remain while out of A-fib, then would consider ischemic evaluation. -Continue beta-blocker and ARB-HCTZ combination.  No threshold to titrate the ARB component further.      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  Previously on Eliquis  but switched to Xarelto  for ease of taking.  No major bleeding issues. For now since we are trying to determine his A-fib burden with event monitor, would hold off on any procedures that would require holding Xarelto .  Once we have cleared what the plan is to perform cardioversion, could then discuss potentially holding for procedures or surgeries.      OSA (obstructive sleep apnea) (Chronic)   Managed with CPAP. No nocturnal dyspnea or orthopnea.            Follow-Up: Return in about 2 months (around 10/03/2023) for Routine follow up with me, To  discuss test results.      Signed, Alm MICAEL Clay, MD, MS Alm Clay, M.D., M.S. Interventional Chartered certified accountant  Pager # [phone]

## 2023-08-02 NOTE — Progress Notes (Unsigned)
 Enrolled for [employer] to mail a ZIO XT long term holter monitor to the patients address on file.

## 2023-08-02 NOTE — Patient Instructions (Addendum)
 Medication Instructions:   No changes *If you need a refill on your cardiac medications before your next appointment, please call your pharmacy*   Lab Work: Not needed If you have labs (blood work) drawn today and your tests are completely normal, you will receive your results only by: MyChart Message (if you have MyChart) OR A paper copy in the mail If you have any lab test that is abnormal or we need to change your treatment, we will call you to review the results.   Testing/Procedures:  Your physician has requested that you have an echocardiogram. Echocardiography is a painless test that uses sound waves to create images of your heart. It provides your doctor with information about the size and shape of your heart and how well your heart's chambers and valves are working. This procedure takes approximately one hour. There are no restrictions for this procedure. Please do NOT wear cologne, perfume, aftershave, or lotions (deodorant is allowed). Please arrive 15 minutes prior to your appointment time.  Please note: We ask at that you not bring children with you during ultrasound (echo/ vascular) testing. Due to room size and safety concerns, children are not allowed in the ultrasound rooms during exams. Our front office staff cannot provide observation of children in our lobby area while testing is being conducted. An adult accompanying a patient to their appointment will only be allowed in the ultrasound room at the discretion of the ultrasound technician under special circumstances. We apologize for any inconvenience.     and Your physician has recommended that you wear a holter monitor 14 day Zio. Holter monitors are medical devices that record the heart's electrical activity. Doctors most often use these monitors to diagnose arrhythmias. Arrhythmias are problems with the speed or rhythm of the heartbeat. The monitor is a small, portable device. You can wear one while you do your normal  daily activities. This is usually used to diagnose what is causing palpitations/syncope (passing out).    Follow-Up: At [facility], you and your health needs are our priority.  As part of our continuing mission to provide you with exceptional heart care, we have created designated Provider Care Teams.  These Care Teams include your primary Cardiologist (physician) and Advanced Practice Providers (APPs -  Physician Assistants and Nurse Practitioners) who all work together to provide you with the care you need, when you need it.     Your next appointment:   2 month(s)  The format for your next appointment:   In Person  Provider:   Alm Clay, MD   Other Instructions  ZIO XT- Long Term Monitor Instructions  Your physician has requested you wear a ZIO patch monitor for 14 days.  This is a single patch monitor. [employer] supplies one patch monitor per enrollment. Additional stickers are not available. Please do not apply patch if you will be having a Nuclear Stress Test,  Echocardiogram, Cardiac CT, MRI, or Chest Xray during the period you would be wearing the  monitor. The patch cannot be worn during these tests. You cannot remove and re-apply the  ZIO XT patch monitor.  Your ZIO patch monitor will be mailed 3 day USPS to your address on file. It may take 3-5 days  to receive your monitor after you have been enrolled.  Once you have received your monitor, please review the enclosed instructions. Your monitor  has already been registered assigning a specific monitor serial # to you.  Billing and Patient Assistance Program Information  We have supplied [employer] with any of your insurance information on file for billing purposes. [employer] offers a sliding scale Patient Assistance Program for patients that do not have  insurance, or whose insurance does not completely cover the cost of the ZIO monitor.  You must apply for the Patient Assistance Program to qualify for this discounted  rate.  To apply, please call [employer] at [phone], select option 4, select option 2, ask to apply for  Patient Assistance Program. Meredeth will ask your household income, and how many people  are in your household. They will quote your out-of-pocket cost based on that information.  [employer] will also be able to set up a 11-month, interest-free payment plan if needed.  Applying the monitor   Shave hair from upper left chest.  Hold abrader disc by orange tab. Rub abrader in 40 strokes over the upper left chest as  indicated in your monitor instructions.  Clean area with 4 enclosed alcohol pads. Let dry.  Apply patch as indicated in monitor instructions. Patch will be placed under collarbone on left  side of chest with arrow pointing upward.  Rub patch adhesive wings for 2 minutes. Remove white label marked 1. Remove the white  label marked 2. Rub patch adhesive wings for 2 additional minutes.  While looking in a mirror, press and release button in center of patch. A small green light will  flash 3-4 times. This will be your only indicator that the monitor has been turned on.  Do not shower for the first 24 hours. You may shower after the first 24 hours.  Press the button if you feel a symptom. You will hear a small click. Record Date, Time and  Symptom in the Patient Logbook.  When you are ready to remove the patch, follow instructions on the last 2 pages of Patient  Logbook. Stick patch monitor onto the last page of Patient Logbook.  Place Patient Logbook in the blue and white box. Use locking tab on box and tape box closed  securely. The blue and white box has prepaid postage on it. Please place it in the mailbox as  soon as possible. Your physician should have your test results approximately 7 days after the  monitor has been mailed back to [facility].  Call [facility] Customer Care at [phone] if you have questions regarding  your ZIO XT patch monitor. Call them  immediately if you see an orange light blinking on your  monitor.  If your monitor falls off in less than 4 days, contact our Monitor department at [phone].  If your monitor becomes loose or falls off after 4 days call [employer] at [phone] for  suggestions on securing your monitor

## 2023-08-03 ENCOUNTER — Encounter: Payer: Self-pay | Admitting: Cardiology

## 2023-08-03 NOTE — Assessment & Plan Note (Signed)
 Currently in A-fib.  Cardiac unaware as far as irregular heartbeats, but has noted a couple months of worsening exertional dyspnea. Unclear if it is persistent or paroxysmal. Stroke risk managed with Xarelto , and rate control with Lopressor  50 mg twice daily  - Order 14-day Zio monitor to assess AFib burden. - Order echocardiogram to evaluate cardiac function. - Consider cardioversion if persistent AFib is confirmed. - Continue Xarelto  20 mg daily for CVA prophylaxis along with Lopressor  50 mg twice daily for rate control.SABRA

## 2023-08-03 NOTE — Assessment & Plan Note (Signed)
 CHA2DS2-VASc score 6.  Previously on Eliquis  but switched to Xarelto  for ease of taking.  No major bleeding issues. For now since we are trying to determine his A-fib burden with event monitor, would hold off on any procedures that would require holding Xarelto .  Once we have cleared what the plan is to perform cardioversion, could then discuss potentially holding for procedures or surgeries.

## 2023-08-03 NOTE — Assessment & Plan Note (Signed)
 Managed with CPAP. No nocturnal dyspnea or orthopnea.

## 2023-08-03 NOTE — Assessment & Plan Note (Signed)
 Previous ischemic valuations have been relatively normal despite abnormal stress test.  Cardiac catheterization back in 2012 showed minimal CAD and he does not actively have any angina.  He did have some abnormal findings on his echocardiogram at time of his TIA.  Most of his symptoms are exertional dyspnea with some edema.  Concern for possible HFpEF or possibly HFmrEF. - Check 2D echo to assess EF and wall motion. - If possible will consider cardioversion to reassess symptoms.  If symptoms remain while out of A-fib, then would consider ischemic evaluation. -Continue beta-blocker and ARB-HCTZ combination.  No threshold to titrate the ARB component further.

## 2023-08-03 NOTE — Assessment & Plan Note (Signed)
 History of TIA in 2017 likely due to AFib. Stroke risk managed with Xarelto .

## 2023-08-03 NOTE — Assessment & Plan Note (Signed)
 Borderline elevated pressure today but better on recheck. - For now continue current dose of Lopressor  25 twice daily and valsartan -HCTZ 160-25 mg daily.  Low threshold to titrate ARB portion further.

## 2023-08-06 ENCOUNTER — Other Ambulatory Visit: Payer: Self-pay | Admitting: Internal Medicine

## 2023-08-09 ENCOUNTER — Other Ambulatory Visit (HOSPITAL_COMMUNITY): Payer: Self-pay

## 2023-08-09 MED ORDER — FLUTICASONE PROPIONATE HFA 110 MCG/ACT IN AERO
2.0000 | INHALATION_SPRAY | Freq: Two times a day (BID) | RESPIRATORY_TRACT | 2 refills | Status: DC
  Filled 2023-08-09: qty 12, 30d supply, fill #0
  Filled 2023-09-22: qty 12, 30d supply, fill #1

## 2023-08-12 ENCOUNTER — Other Ambulatory Visit: Payer: Self-pay | Admitting: Internal Medicine

## 2023-08-12 ENCOUNTER — Encounter: Payer: Self-pay | Admitting: Internal Medicine

## 2023-08-12 DIAGNOSIS — L309 Dermatitis, unspecified: Secondary | ICD-10-CM

## 2023-08-12 MED ORDER — TRIAMCINOLONE ACETONIDE 0.1 % EX CREA: 1.0000 | TOPICAL_CREAM | Freq: Three times a day (TID) | CUTANEOUS | 0 refills | Status: DC

## 2023-08-12 NOTE — Telephone Encounter (Signed)
 Okay to refill?

## 2023-08-23 DIAGNOSIS — I480 Paroxysmal atrial fibrillation: Secondary | ICD-10-CM | POA: Diagnosis not present

## 2023-08-30 DIAGNOSIS — G4733 Obstructive sleep apnea (adult) (pediatric): Secondary | ICD-10-CM | POA: Diagnosis not present

## 2023-09-24 ENCOUNTER — Other Ambulatory Visit: Payer: Self-pay | Admitting: Internal Medicine

## 2023-09-27 ENCOUNTER — Ambulatory Visit (HOSPITAL_COMMUNITY)
Admission: RE | Admit: 2023-09-27 | Discharge: 2023-09-27 | Disposition: A | Discharge status: Home / Self Care | Source: Ambulatory Visit | Attending: Cardiology | Admitting: Cardiology

## 2023-09-27 DIAGNOSIS — R0609 Other forms of dyspnea: Secondary | ICD-10-CM | POA: Insufficient documentation

## 2023-09-27 DIAGNOSIS — I480 Paroxysmal atrial fibrillation: Secondary | ICD-10-CM | POA: Insufficient documentation

## 2023-09-27 LAB — ECHOCARDIOGRAM COMPLETE
AR max vel: 1.61 cm2
AV Area VTI: 1.65 cm2
AV Area mean vel: 1.47 cm2
AV Mean grad: 6.3 mmHg
AV Peak grad: 10.5 mmHg
Ao pk vel: 1.62 m/s
P 1/2 time: 391 ms
S' Lateral: 4.2 cm

## 2023-09-28 ENCOUNTER — Ambulatory Visit: Payer: Self-pay | Admitting: Cardiology

## 2023-09-28 ENCOUNTER — Other Ambulatory Visit: Payer: Self-pay | Admitting: Allergy

## 2023-09-30 DIAGNOSIS — G4733 Obstructive sleep apnea (adult) (pediatric): Secondary | ICD-10-CM | POA: Diagnosis not present

## 2023-10-01 ENCOUNTER — Encounter (HOSPITAL_BASED_OUTPATIENT_CLINIC_OR_DEPARTMENT_OTHER): Payer: Self-pay

## 2023-10-05 ENCOUNTER — Ambulatory Visit: Attending: Cardiology | Admitting: Cardiology

## 2023-10-05 ENCOUNTER — Encounter: Payer: Self-pay | Admitting: Cardiology

## 2023-10-05 VITALS — BP 146/90 | HR 98 | Ht 67.0 in | Wt 214.0 lb

## 2023-10-05 DIAGNOSIS — I2510 Atherosclerotic heart disease of native coronary artery without angina pectoris: Secondary | ICD-10-CM | POA: Diagnosis not present

## 2023-10-05 DIAGNOSIS — D6869 Other thrombophilia: Secondary | ICD-10-CM | POA: Diagnosis not present

## 2023-10-05 DIAGNOSIS — G4733 Obstructive sleep apnea (adult) (pediatric): Secondary | ICD-10-CM

## 2023-10-05 DIAGNOSIS — I420 Dilated cardiomyopathy: Secondary | ICD-10-CM | POA: Insufficient documentation

## 2023-10-05 DIAGNOSIS — I4819 Other persistent atrial fibrillation: Secondary | ICD-10-CM

## 2023-10-05 DIAGNOSIS — I350 Nonrheumatic aortic (valve) stenosis: Secondary | ICD-10-CM | POA: Insufficient documentation

## 2023-10-05 DIAGNOSIS — I10 Essential (primary) hypertension: Secondary | ICD-10-CM | POA: Diagnosis not present

## 2023-10-05 DIAGNOSIS — R0609 Other forms of dyspnea: Secondary | ICD-10-CM | POA: Diagnosis not present

## 2023-10-05 DIAGNOSIS — I480 Paroxysmal atrial fibrillation: Secondary | ICD-10-CM | POA: Diagnosis not present

## 2023-10-05 DIAGNOSIS — I77810 Thoracic aortic ectasia: Secondary | ICD-10-CM | POA: Insufficient documentation

## 2023-10-05 DIAGNOSIS — Z01812 Encounter for preprocedural laboratory examination: Secondary | ICD-10-CM | POA: Diagnosis not present

## 2023-10-05 MED ORDER — METOPROLOL SUCCINATE ER 50 MG PO TB24: 50.0000 mg | ORAL_TABLET | Freq: Two times a day (BID) | ORAL | 3 refills | Status: AC

## 2023-10-05 MED ORDER — AMIODARONE HCL 200 MG PO TABS: ORAL_TABLET | ORAL | 3 refills | Status: DC

## 2023-10-05 MED ORDER — VALSARTAN-HYDROCHLOROTHIAZIDE 160-25 MG PO TABS: 2.0000 | ORAL_TABLET | Freq: Every day | ORAL | 11 refills | Status: DC

## 2023-10-05 NOTE — Assessment & Plan Note (Signed)
 Continue CPAP.

## 2023-10-05 NOTE — Assessment & Plan Note (Signed)
 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 - Monitor ascending aortic dilation with periodic imaging.

## 2023-10-05 NOTE — Progress Notes (Signed)
 Cardiology Office Note:  .   Date:  10/05/2023  ID:  Joel Payne, DOB 05-10-1945, MRN [id number] PCP: Theophilus Andrews, Tully GRADE, MD  [facility] Providers Cardiologist:  Alm Clay, MD     Chief Complaint  Patient presents with   Follow-up    Here to discuss test results   Cardiomyopathy    Reduced EF on echo   Atrial Fibrillation    Persistent A-fib    Patient Profile: .     Joel Payne is a  78 y.o. male  with a PMH noted below who presents here for 2 month f/u at the request of Theophilus Andrews, [name].  PMH notable for mild nonobstructive CAD, PAF, left PCA (TIA 2017) CVA, HTN, HLD, OSA  He was previously seen by Dr. Katz-last visit was Jun 05, 2010 for evaluation of dyspnea and abnormal nuclear stress test.  He had had a heart catheterization on May 19, 2010 which found out 20 to 30% LAD and LCx stenosis and normal LV function.  He was recently diagnosed with A-fib back in January 2025, and part of evaluation involved echocardiogram which he now returns to discuss the results indicating significant reduced EF.     Joel Payne was last seen on 08/02/2023 noting symptoms of exertional dyspnea, edema irregular heart rate but no chest tightness or pressure.  He is basically here to reestablish cardiology care.  Most notable symptom is exertional intolerance and fatigue.  He was noted to be in A-fib.  We ordered a 14-day Zio patch monitor to see his burden we also ordered an echocardiogram with plans to potentially consider A-fib cardioversion.  Continue on Xarelto  20 mg daily and Lopressor  50 mg twice daily.  Blood pressure is borderline elevated.  We also ordered Zio patch monitor to determine A-fib burden that showed 100% A-fib. Echocardiogram showed reduced EF down to 35 to 40% global hypokinesis and moderate to severely dilated left atrium likely related to A-fib. He is here to discuss results of his Zio patch monitor and echocardiogram and the finding of  cardiomyopathy.  Subjective  Discussed the use of AI scribe software for clinical note transcription with the patient, who gave verbal consent to proceed.  History of Present Illness Joel Payne is a 78 year old male with atrial fibrillation who presents with exertional dyspnea and fatigue. He is accompanied by his wife, Joel Payne.  He has been experiencing exertional dyspnea and fatigue for about a year to a year and a half. He feels extremely tired after activities and wears out easily. No orthopnea or paroxysmal nocturnal dyspnea. Occasionally experiences a little twinge but denies chest tightness, pressure, or pain during activities.  He has a history of atrial fibrillation and was recently monitored with a Zio patch, which indicated he was in atrial fibrillation 100% of the time with heart rates ranging from 62 to 176 beats per minute. He is currently on Xarelto  for anticoagulation and metoprolol  tartrate 50 mg twice a day for rate control. No bleeding or blood in stools.  An echocardiogram showed an ejection fraction now between 35-40%, down from a previous normal range of 55-60%. The echocardiogram also revealed a dilated left atrium and some dilation of the right atrium. There is evidence of a calcified aortic valve, although precise measurements were difficult due to atrial fibrillation. The ascending aorta is slightly dilated at 42 mm.  He uses a CPAP machine for sleep apnea. No new swelling in the legs, although he  has had swelling in his feet and ankles for some time. He is currently taking valsartan  160 mg with HCTZ.  His atrial fibrillation was first discovered during a TIA event, and he was subsequently placed on anticoagulation therapy. He has a family history of atrial fibrillation, as his mother had it due to mitral valve prolapse.   Cardiovascular ROS: positive for - dyspnea on exertion, irregular heartbeat, rapid heart rate, shortness of breath, and mostly fatigue as well as  dyspnea.  Lack of exercise tolerance.  Chronic mild lower extremity edema.  Easily fatigued/tired and occasional chest twinges. negative for - orthopnea, paroxysmal nocturnal dyspnea, rapid heart rate, shortness of breath, or syncope/near-syncope or TIA/amaurosis fugax, claudication  ROS:  Review of Systems - Negative except symptoms noted above    Objective   Medications - Xarelto  20 mg - Metoprolol  50 mg twice a day - Valsartan  HCTZ 160/12.5 mg - Pravachol  20 mg - Potassium supplement-Klor-Con  20 mg 1 daily - Vitamin D3 over the counter; vitamin B12 injections every 30 days - Triamcinolone  cream - Albuterol  inhaler as needed and Atrovent  2 sprays per nostril 4 times a day as needed; Flonase  inhaler 2 puffs twice daily; Xyzal  5 mg as needed  Studies Reviewed: SABRA   EKG Interpretation Date/Time:  Tuesday October 05 2023 11:27:03 EDT Ventricular Rate:  98 PR Interval:    QRS Duration:  98 QT Interval:  358 QTC Calculation: 457 R Axis:   -21  Text Interpretation: Atrial fibrillation Moderate voltage criteria for LVH, may be normal variant ( R in aVL , Cornell product ) T wave abnormality, consider lateral ischemia When compared with ECG of 02-Aug-2023 11:29, No significant change was found Confirmed by Anner Lenis ([id number]) on 10/05/2023 12:17:28 PM    Lab Results  Component Value Date   NA 142 04/08/2023   K 3.9 04/08/2023   CREATININE 0.90 04/08/2023   GFR 82.08 04/08/2023   GLUCOSE 102 (H) 04/08/2023      Latest Ref Rng & Units 04/08/2023    9:30 AM 01/20/2022   11:55 AM 10/15/2021    9:40 AM  CBC  WBC 4.0 - 10.5 K/uL 6.2  5.8  5.3   Hemoglobin 13.0 - 17.0 g/dL 84.0  84.1  85.1   Hematocrit 39.0 - 52.0 % 47.5  46.0  43.1   Platelets 150.0 - 400.0 K/uL 184.0  241.0  186.0    Lab Results  Component Value Date   HGBA1C 5.7 01/20/2022   Lab Results  Component Value Date   CHOL 175 04/08/2023   HDL 35.70 (L) 04/08/2023   LDLCALC 115 (H) 04/08/2023   LDLDIRECT 157.5  04/06/2012   TRIG 120.0 04/08/2023   CHOLHDL 5 04/08/2023    Results DIAGNOSTIC Zio patch monitor: 100% atrial fibrillation, heart rate 62-176 bpm, premature ventricular beats, 4-5 beat runs (August 2025) Echocardiogram: Ejection fraction 35-40%, global hypokinesis, dilated left atrium, dilated right atrium, calcified aortic valve, moderate aortic stenosis, ascending aorta 42 mm, 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  Mild concentric LVH.  Low normal EF 5055%.  Severe HK of the mid-apical inferolateral wall, and suggestion of focal apical akinesis but not well-visualized.  Suggested Definity  contrast study.  GR 2 DD.  Mildly calcified AoV with mild MR.  (10/29/2015) Transesophageal Echocardiogram: EF 60 to 65%.  Mild to moderate AI.  Mild MR.  Moderate LA dilation but no LAA thrombus.  No RA Promus.  Right left shunt noted only with Valsalva  and PFO seen by color-flow.  Mild to moderate TR.  Went to A-fib RVR after esophageal intubation treated with IV Lopressor  and amiodarone . => Started on DOAC.   Left Heart Cath-Coronary Angiography: Minimal disease with 20 to 30% proximal LAD and proximal LCx stenosis otherwise normal.  (May 19, 2010) TM Myoview  : No chest pain or EKG changes but did develop ventricular bigeminy with exertion.  Nuclear images suggestive of fixed inferior defect with some peri-infarct ischemia.  EF 56%.  (05/12/2010)    Risk Assessment/Calculations:    CHA2DS2-VASc Score = 7   This indicates a 11.2% annual risk of stroke. The patient's score is based upon: CHF History: 1 HTN History: 1 Diabetes History: 0 Stroke History: 2 Vascular Disease History: 1 Age Score: 2 Gender Score: 0         Physical Exam:   VS:  BP (!) 146/90   Pulse 98   Ht 5' 7 (1.702 m)   Wt 214 lb (97.1 kg)   SpO2 97%   BMI 33.52 kg/m    Wt Readings from Last 3 Encounters:  10/05/23 214 lb (97.1 kg)  08/02/23 214 lb 9.6 oz  (97.3 kg)  07/27/23 211 lb 4.8 oz (95.8 kg)      GEN: Well nourished, well groomed in no acute distress; mildly obese but otherwise relatively well-appearing. NECK: No JVD; No carotid bruits CARDIAC: Normal S1, S2; irregularly irregular rhythm with normal rate. no murmurs, rubs, gallops RESPIRATORY:  Clear to auscultation without rales, wheezing or rhonchi ; nonlabored, good air movement. ABDOMEN: Soft, non-tender, non-distended EXTREMITIES:  No edema; No deformity      ASSESSMENT AND PLAN: .    Problem List Items Addressed This Visit       Cardiology Problems   Coronary atherosclerosis (Chronic)   Mild disease but Back in 2012.  Unfortunately he has significant reduced EF.  Most likely the cause of reduced EF is related to A-fib and other arrhythmia with some tachycardia mediated cardiomyopathy.  However, would need to exclude ischemic cause once were able to get him out of A-fib.  Pending improvement post cardioversion, can consider ischemic evaluation with stress test.      Relevant Medications   valsartan -hydrochlorothiazide  (DIOVAN -HCT) 160-25 MG tablet   metoprolol  succinate (TOPROL -XL) 50 MG 24 hr tablet   amiodarone  (PACERONE ) 200 MG tablet   Dilated cardiomyopathy (HCC) (Chronic)   Reduced EF with HFrEF symptoms.  Likely secondary to persistent A-fib. Initial echocardiogram showed reduced ejection fraction at 35-40%, likely exacerbated by persistent atrial fibrillation, leading to decreased cardiac output, fatigue, and exertional dyspnea. Left atrial dilation complicates atrial fibrillation management. - Switch metoprolol  tartrate to metoprolol  succinate 50 mg BID. - Increase valsartan  HCT to 320 mg daily (two tabs of 160 mg). - Initiate amiodarone  as per A-fib plan. - Set up for cardioversion next week with plans for close follow-up to reassess symptomatology.  Following cardioversion and titration of medications will reassess EF.  If still remains down, will plan for  ischemic evaluation with right left heart cath.      Relevant Medications   valsartan -hydrochlorothiazide  (DIOVAN -HCT) 160-25 MG tablet   metoprolol  succinate (TOPROL -XL) 50 MG 24 hr tablet   amiodarone  (PACERONE ) 200 MG tablet   Dilation of thoracic aorta (Chronic)   Mild dilation of the ascending aorta measuring 42 mm, not meeting criteria for aneurysm. - Monitor ascending aortic dilation with periodic imaging.      Relevant Medications   valsartan -hydrochlorothiazide  (DIOVAN -HCT) 160-25 MG tablet  metoprolol  succinate (TOPROL -XL) 50 MG 24 hr tablet   amiodarone  (PACERONE ) 200 MG tablet   Essential hypertension (Chronic)   Hypertensive today.- Hypertension management is crucial given heart failure and atrial fibrillation. - Increase valsartan -HCT to 320-25 mg daily as per heart failure plan. - Switch metoprolol  to tartrate to metoprolol  succinate 50 mg twice daily - Once on adequate dose of ARB, can consider adding spironolactone and potentially converting to Entresto based on low EF.      Relevant Medications   valsartan -hydrochlorothiazide  (DIOVAN -HCT) 160-25 MG tablet   metoprolol  succinate (TOPROL -XL) 50 MG 24 hr tablet   amiodarone  (PACERONE ) 200 MG tablet   Other Relevant Orders   EKG 12-Lead (Completed)   Moderate aortic stenosis by prior echocardiogram (Chronic)   In addition to showing reduced EF, there is also suggestion of possible moderate AS. The exact severity is difficult to assess due to atrial fibrillation. No significant murmur detected, suggesting uncertainty in severity assessment. - Monitor aortic stenosis severity after addressing atrial fibrillation and heart failure => we will be checking an echo shortly after conversion (1 to 2 months) and then will follow-up with echoes based on those findings.      Relevant Medications   valsartan -hydrochlorothiazide  (DIOVAN -HCT) 160-25 MG tablet   metoprolol  succinate (TOPROL -XL) 50 MG 24 hr tablet   amiodarone   (PACERONE ) 200 MG tablet   Persistent atrial fibrillation (HCC) - Primary (Chronic)   Confirmed by Zio patch monitor, with 100% time in atrial fibrillation and heart rate ranging from 62 to 176 bpm. Contributes to heart failure and reduced ejection fraction. Cardioversion is reasonable to attempt restoration of sinus rhythm. Amiodarone  is planned to facilitate cardioversion and maintain sinus rhythm. Risks include potential for ventricular tachycardia or heart block, mitigated by hospital setting and anticoagulation with Xarelto . - Continue Xarelto  20 mg daily for anticoagulation. - Initiate amiodarone  for facility DCCV:  Loading dose: 400 mg BID for one week, then 400 mg daily for one week, followed by 200 mg daily for two weeks.  - Set up for cardioversion next week. - Refer to electrophysiology for further management of atrial fibrillation. - Reassess ejection fraction after cardioversion (recheck 2D echo 1 to 2 months out post cardioversion.      Relevant Medications   valsartan -hydrochlorothiazide  (DIOVAN -HCT) 160-25 MG tablet   metoprolol  succinate (TOPROL -XL) 50 MG 24 hr tablet   amiodarone  (PACERONE ) 200 MG tablet   Other Relevant Orders   EKG 12-Lead (Completed)   Basic metabolic panel with GFR   CBC   Ambulatory referral to Cardiac Electrophysiology     Other   DOE (dyspnea on exertion)   Likely related to combination of atrial fibs but also with reduced EF. Will see how he does hopefully with attempted cardioversion out of A-fib.  Plan will be facilitated with amiodarone .  Increasing afterload reduction with plans to further titrate as needed. = > With elevated blood pressures, we will increase valsartan -HCTZ to double dose (equivalent to 320 mg - 25 mg dosing, and consider spironolactone follow-up.  He is euvolemic and antibiotic related to diuretic. Reassessing EF post cardioversion with plans to potentially consider ischemic evaluation pending results..       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  Has been on Eliquis  with no bleeding issues.  Has not missed any doses.  For now he needs to stay on it until his cardioversion completed and then maintain use for at least 4 weeks post  cardioversion.  Following that 1 month post cardioversion, would be okay to temporarily hold for procedures or surgeries 2 to 3 days.  For instance, if you stop for cardiac catheterization, would stop~2 days/4 doses prior to cath.      OSA (obstructive sleep apnea) (Chronic)   Continue CPAP      Other Visit Diagnoses       Pre-procedure lab exam       Relevant Orders   Basic metabolic panel with GFR   CBC       Patient's records: Stop Metoprolol  Tartrate Start Metoprolol  Succinate 50 mg twice daily Increase Valsartan -hydrochlorothiazide  160-25 mg  to 2 tablets daily Take Amiodarone  400 mg twice a day for one week, then Amiodarone  400 mg once a day for one week, then Amiodarone  200 mg a day for 2 weeks  DCCV 10/12/2023 with Dr. Lonni. => Close follow-up after cardioversion with plans to reassess EF and consider referral to EP.      Informed Consent   Shared Decision Making/Informed Consent The risks, including but not limited to, [bleeding or vascular complications (1 in 500), pneumothorax (1 in 1600), arrhythmia (1 in 1000) and death (1 in 5000)], benefits (diagnostic support and/or management of heart failure, pulmonary hypertension) and alternatives of a right heart catheterization were discussed in detail with Joel Payne and he is willing to proceed. The risks [stroke (1 in 1000), death (1 in 1000), kidney failure [usually temporary] (1 in 500), bleeding (1 in 200), allergic reaction [possibly serious] (1 in 200)], benefits (diagnostic support and management of coronary artery disease) and alternatives of a cardiac catheterization were discussed in detail with Joel Payne and he is  willing to proceed.   The risks (stroke, cardiac arrhythmias rarely resulting in the need for a temporary or permanent pacemaker, skin irritation or burns and complications associated with conscious sedation including aspiration, arrhythmia, respiratory failure and death), benefits (restoration of normal sinus rhythm) and alternatives of a direct current cardioversion were explained in detail to Joel Payne and he agrees to proceed.        Follow-Up: Return in about 2 weeks (around 10/19/2023) for Follow-up with APP, followed by alternate 3 months with APP, 6 month with MD.  I spent 58 minutes in the care of Joel Payne today including reviewing labs (1 minutes), reviewing studies (echocardiogram and monitor reviewed-images of echo reviewed along prior echocardiogram and Myoview  as well as cath-6 minutes), face to face time discussing treatment options (36 minutes), reviewing records from my last note as well as Previous evaluation in 2017  (4 months), 11 minutes dictating, and documenting in the encounter.      Signed, Alm MICAEL Clay, MD, MS Alm Clay, M.D., M.S. Interventional Cardiologist  [facility] Pager # [phone]

## 2023-10-05 NOTE — Assessment & Plan Note (Signed)
 In addition to showing reduced EF, there is also suggestion of possible moderate AS. The exact severity is difficult to assess due to atrial fibrillation. No significant murmur detected, suggesting uncertainty in severity assessment. - Monitor aortic stenosis severity after addressing atrial fibrillation and heart failure => we will be checking an echo shortly after conversion (1 to 2 months) and then will follow-up with echoes based on those findings.

## 2023-10-05 NOTE — Assessment & Plan Note (Signed)
 CHA2DS2-VASc score 7 now with reduced EF.  Has been on Eliquis  with no bleeding issues.  Has not missed any doses.  For now he needs to stay on it until his cardioversion completed and then maintain use for at least 4 weeks post cardioversion.  Following that 1 month post cardioversion, would be okay to temporarily hold for procedures or surgeries 2 to 3 days.  For instance, if you stop for cardiac catheterization, would stop~2 days/4 doses prior to cath.

## 2023-10-05 NOTE — Assessment & Plan Note (Addendum)
 Likely related to combination of atrial fibs but also with reduced EF. Will see how he does hopefully with attempted cardioversion out of A-fib.  Plan will be facilitated with amiodarone .  Increasing afterload reduction with plans to further titrate as needed. = > With elevated blood pressures, we will increase valsartan -HCTZ to double dose (equivalent to 320 mg - 25 mg dosing, and consider spironolactone follow-up.  He is euvolemic and antibiotic related to diuretic. Reassessing EF post cardioversion with plans to potentially consider ischemic evaluation pending results.SABRA

## 2023-10-05 NOTE — Assessment & Plan Note (Signed)
 Reduced EF with HFrEF symptoms.  Likely secondary to persistent A-fib. Initial echocardiogram showed reduced ejection fraction at 35-40%, likely exacerbated by persistent atrial fibrillation, leading to decreased cardiac output, fatigue, and exertional dyspnea. Left atrial dilation complicates atrial fibrillation management. - Switch metoprolol  tartrate to metoprolol  succinate 50 mg BID. - Increase valsartan  HCT to 320 mg daily (two tabs of 160 mg). - Initiate amiodarone  as per A-fib plan. - Set up for cardioversion next week with plans for close follow-up to reassess symptomatology.  Following cardioversion and titration of medications will reassess EF.  If still remains down, will plan for ischemic evaluation with right left heart cath.

## 2023-10-05 NOTE — Assessment & Plan Note (Signed)
 Mild disease but Back in 2012.  Unfortunately he has significant reduced EF.  Most likely the cause of reduced EF is related to A-fib and other arrhythmia with some tachycardia mediated cardiomyopathy.  However, would need to exclude ischemic cause once were able to get him out of A-fib.  Pending improvement post cardioversion, can consider ischemic evaluation with stress test.

## 2023-10-05 NOTE — H&P (View-Only) (Signed)
 Cardiology Office Note:  .   Date:  10/05/2023  ID:  Joel Payne, DOB 05-10-1945, MRN [id number] PCP: Theophilus Andrews, Tully GRADE, MD  [facility] Providers Cardiologist:  Alm Clay, MD     Chief Complaint  Patient presents with   Follow-up    Here to discuss test results   Cardiomyopathy    Reduced EF on echo   Atrial Fibrillation    Persistent A-fib    Patient Profile: .     Joel Payne is a  78 y.o. male  with a PMH noted below who presents here for 2 month f/u at the request of Theophilus Andrews, [name].  PMH notable for mild nonobstructive CAD, PAF, left PCA (TIA 2017) CVA, HTN, HLD, OSA  He was previously seen by Dr. Katz-last visit was Jun 05, 2010 for evaluation of dyspnea and abnormal nuclear stress test.  He had had a heart catheterization on May 19, 2010 which found out 20 to 30% LAD and LCx stenosis and normal LV function.  He was recently diagnosed with A-fib back in January 2025, and part of evaluation involved echocardiogram which he now returns to discuss the results indicating significant reduced EF.     Joel Payne was last seen on 08/02/2023 noting symptoms of exertional dyspnea, edema irregular heart rate but no chest tightness or pressure.  He is basically here to reestablish cardiology care.  Most notable symptom is exertional intolerance and fatigue.  He was noted to be in A-fib.  We ordered a 14-day Zio patch monitor to see his burden we also ordered an echocardiogram with plans to potentially consider A-fib cardioversion.  Continue on Xarelto  20 mg daily and Lopressor  50 mg twice daily.  Blood pressure is borderline elevated.  We also ordered Zio patch monitor to determine A-fib burden that showed 100% A-fib. Echocardiogram showed reduced EF down to 35 to 40% global hypokinesis and moderate to severely dilated left atrium likely related to A-fib. He is here to discuss results of his Zio patch monitor and echocardiogram and the finding of  cardiomyopathy.  Subjective  Discussed the use of AI scribe software for clinical note transcription with the patient, who gave verbal consent to proceed.  History of Present Illness Joel Payne is a 78 year old male with atrial fibrillation who presents with exertional dyspnea and fatigue. He is accompanied by his wife, Joel Payne.  He has been experiencing exertional dyspnea and fatigue for about a year to a year and a half. He feels extremely tired after activities and wears out easily. No orthopnea or paroxysmal nocturnal dyspnea. Occasionally experiences a little twinge but denies chest tightness, pressure, or pain during activities.  He has a history of atrial fibrillation and was recently monitored with a Zio patch, which indicated he was in atrial fibrillation 100% of the time with heart rates ranging from 62 to 176 beats per minute. He is currently on Xarelto  for anticoagulation and metoprolol  tartrate 50 mg twice a day for rate control. No bleeding or blood in stools.  An echocardiogram showed an ejection fraction now between 35-40%, down from a previous normal range of 55-60%. The echocardiogram also revealed a dilated left atrium and some dilation of the right atrium. There is evidence of a calcified aortic valve, although precise measurements were difficult due to atrial fibrillation. The ascending aorta is slightly dilated at 42 mm.  He uses a CPAP machine for sleep apnea. No new swelling in the legs, although he  has had swelling in his feet and ankles for some time. He is currently taking valsartan  160 mg with HCTZ.  His atrial fibrillation was first discovered during a TIA event, and he was subsequently placed on anticoagulation therapy. He has a family history of atrial fibrillation, as his mother had it due to mitral valve prolapse.   Cardiovascular ROS: positive for - dyspnea on exertion, irregular heartbeat, rapid heart rate, shortness of breath, and mostly fatigue as well as  dyspnea.  Lack of exercise tolerance.  Chronic mild lower extremity edema.  Easily fatigued/tired and occasional chest twinges. negative for - orthopnea, paroxysmal nocturnal dyspnea, rapid heart rate, shortness of breath, or syncope/near-syncope or TIA/amaurosis fugax, claudication  ROS:  Review of Systems - Negative except symptoms noted above    Objective   Medications - Xarelto  20 mg - Metoprolol  50 mg twice a day - Valsartan  HCTZ 160/12.5 mg - Pravachol  20 mg - Potassium supplement-Klor-Con  20 mg 1 daily - Vitamin D3 over the counter; vitamin B12 injections every 30 days - Triamcinolone  cream - Albuterol  inhaler as needed and Atrovent  2 sprays per nostril 4 times a day as needed; Flonase  inhaler 2 puffs twice daily; Xyzal  5 mg as needed  Studies Reviewed: Joel Payne   EKG Interpretation Date/Time:  Tuesday October 05 2023 11:27:03 EDT Ventricular Rate:  98 PR Interval:    QRS Duration:  98 QT Interval:  358 QTC Calculation: 457 R Axis:   -21  Text Interpretation: Atrial fibrillation Moderate voltage criteria for LVH, may be normal variant ( R in aVL , Cornell product ) T wave abnormality, consider lateral ischemia When compared with ECG of 02-Aug-2023 11:29, No significant change was found Confirmed by Anner Lenis ([id number]) on 10/05/2023 12:17:28 PM    Lab Results  Component Value Date   NA 142 04/08/2023   K 3.9 04/08/2023   CREATININE 0.90 04/08/2023   GFR 82.08 04/08/2023   GLUCOSE 102 (H) 04/08/2023      Latest Ref Rng & Units 04/08/2023    9:30 AM 01/20/2022   11:55 AM 10/15/2021    9:40 AM  CBC  WBC 4.0 - 10.5 K/uL 6.2  5.8  5.3   Hemoglobin 13.0 - 17.0 g/dL 84.0  84.1  85.1   Hematocrit 39.0 - 52.0 % 47.5  46.0  43.1   Platelets 150.0 - 400.0 K/uL 184.0  241.0  186.0    Lab Results  Component Value Date   HGBA1C 5.7 01/20/2022   Lab Results  Component Value Date   CHOL 175 04/08/2023   HDL 35.70 (L) 04/08/2023   LDLCALC 115 (H) 04/08/2023   LDLDIRECT 157.5  04/06/2012   TRIG 120.0 04/08/2023   CHOLHDL 5 04/08/2023    Results DIAGNOSTIC Zio patch monitor: 100% atrial fibrillation, heart rate 62-176 bpm, premature ventricular beats, 4-5 beat runs (August 2025) Echocardiogram: Ejection fraction 35-40%, global hypokinesis, dilated left atrium, dilated right atrium, calcified aortic valve, moderate aortic stenosis, ascending aorta 42 mm, 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  Mild concentric LVH.  Low normal EF 5055%.  Severe HK of the mid-apical inferolateral wall, and suggestion of focal apical akinesis but not well-visualized.  Suggested Definity  contrast study.  GR 2 DD.  Mildly calcified AoV with mild MR.  (10/29/2015) Transesophageal Echocardiogram: EF 60 to 65%.  Mild to moderate AI.  Mild MR.  Moderate LA dilation but no LAA thrombus.  No RA Promus.  Right left shunt noted only with Valsalva  and PFO seen by color-flow.  Mild to moderate TR.  Went to A-fib RVR after esophageal intubation treated with IV Lopressor  and amiodarone . => Started on DOAC.   Left Heart Cath-Coronary Angiography: Minimal disease with 20 to 30% proximal LAD and proximal LCx stenosis otherwise normal.  (May 19, 2010) TM Myoview  : No chest pain or EKG changes but did develop ventricular bigeminy with exertion.  Nuclear images suggestive of fixed inferior defect with some peri-infarct ischemia.  EF 56%.  (05/12/2010)    Risk Assessment/Calculations:    CHA2DS2-VASc Score = 7   This indicates a 11.2% annual risk of stroke. The patient's score is based upon: CHF History: 1 HTN History: 1 Diabetes History: 0 Stroke History: 2 Vascular Disease History: 1 Age Score: 2 Gender Score: 0         Physical Exam:   VS:  BP (!) 146/90   Pulse 98   Ht 5' 7 (1.702 m)   Wt 214 lb (97.1 kg)   SpO2 97%   BMI 33.52 kg/m    Wt Readings from Last 3 Encounters:  10/05/23 214 lb (97.1 kg)  08/02/23 214 lb 9.6 oz  (97.3 kg)  07/27/23 211 lb 4.8 oz (95.8 kg)      GEN: Well nourished, well groomed in no acute distress; mildly obese but otherwise relatively well-appearing. NECK: No JVD; No carotid bruits CARDIAC: Normal S1, S2; irregularly irregular rhythm with normal rate. no murmurs, rubs, gallops RESPIRATORY:  Clear to auscultation without rales, wheezing or rhonchi ; nonlabored, good air movement. ABDOMEN: Soft, non-tender, non-distended EXTREMITIES:  No edema; No deformity      ASSESSMENT AND PLAN: .    Problem List Items Addressed This Visit       Cardiology Problems   Coronary atherosclerosis (Chronic)   Mild disease but Back in 2012.  Unfortunately he has significant reduced EF.  Most likely the cause of reduced EF is related to A-fib and other arrhythmia with some tachycardia mediated cardiomyopathy.  However, would need to exclude ischemic cause once were able to get him out of A-fib.  Pending improvement post cardioversion, can consider ischemic evaluation with stress test.      Relevant Medications   valsartan -hydrochlorothiazide  (DIOVAN -HCT) 160-25 MG tablet   metoprolol  succinate (TOPROL -XL) 50 MG 24 hr tablet   amiodarone  (PACERONE ) 200 MG tablet   Dilated cardiomyopathy (HCC) (Chronic)   Reduced EF with HFrEF symptoms.  Likely secondary to persistent A-fib. Initial echocardiogram showed reduced ejection fraction at 35-40%, likely exacerbated by persistent atrial fibrillation, leading to decreased cardiac output, fatigue, and exertional dyspnea. Left atrial dilation complicates atrial fibrillation management. - Switch metoprolol  tartrate to metoprolol  succinate 50 mg BID. - Increase valsartan  HCT to 320 mg daily (two tabs of 160 mg). - Initiate amiodarone  as per A-fib plan. - Set up for cardioversion next week with plans for close follow-up to reassess symptomatology.  Following cardioversion and titration of medications will reassess EF.  If still remains down, will plan for  ischemic evaluation with right left heart cath.      Relevant Medications   valsartan -hydrochlorothiazide  (DIOVAN -HCT) 160-25 MG tablet   metoprolol  succinate (TOPROL -XL) 50 MG 24 hr tablet   amiodarone  (PACERONE ) 200 MG tablet   Dilation of thoracic aorta (Chronic)   Mild dilation of the ascending aorta measuring 42 mm, not meeting criteria for aneurysm. - Monitor ascending aortic dilation with periodic imaging.      Relevant Medications   valsartan -hydrochlorothiazide  (DIOVAN -HCT) 160-25 MG tablet  metoprolol  succinate (TOPROL -XL) 50 MG 24 hr tablet   amiodarone  (PACERONE ) 200 MG tablet   Essential hypertension (Chronic)   Hypertensive today.- Hypertension management is crucial given heart failure and atrial fibrillation. - Increase valsartan -HCT to 320-25 mg daily as per heart failure plan. - Switch metoprolol  to tartrate to metoprolol  succinate 50 mg twice daily - Once on adequate dose of ARB, can consider adding spironolactone and potentially converting to Entresto based on low EF.      Relevant Medications   valsartan -hydrochlorothiazide  (DIOVAN -HCT) 160-25 MG tablet   metoprolol  succinate (TOPROL -XL) 50 MG 24 hr tablet   amiodarone  (PACERONE ) 200 MG tablet   Other Relevant Orders   EKG 12-Lead (Completed)   Moderate aortic stenosis by prior echocardiogram (Chronic)   In addition to showing reduced EF, there is also suggestion of possible moderate AS. The exact severity is difficult to assess due to atrial fibrillation. No significant murmur detected, suggesting uncertainty in severity assessment. - Monitor aortic stenosis severity after addressing atrial fibrillation and heart failure => we will be checking an echo shortly after conversion (1 to 2 months) and then will follow-up with echoes based on those findings.      Relevant Medications   valsartan -hydrochlorothiazide  (DIOVAN -HCT) 160-25 MG tablet   metoprolol  succinate (TOPROL -XL) 50 MG 24 hr tablet   amiodarone   (PACERONE ) 200 MG tablet   Persistent atrial fibrillation (HCC) - Primary (Chronic)   Confirmed by Zio patch monitor, with 100% time in atrial fibrillation and heart rate ranging from 62 to 176 bpm. Contributes to heart failure and reduced ejection fraction. Cardioversion is reasonable to attempt restoration of sinus rhythm. Amiodarone  is planned to facilitate cardioversion and maintain sinus rhythm. Risks include potential for ventricular tachycardia or heart block, mitigated by hospital setting and anticoagulation with Xarelto . - Continue Xarelto  20 mg daily for anticoagulation. - Initiate amiodarone  for facility DCCV:  Loading dose: 400 mg BID for one week, then 400 mg daily for one week, followed by 200 mg daily for two weeks.  - Set up for cardioversion next week. - Refer to electrophysiology for further management of atrial fibrillation. - Reassess ejection fraction after cardioversion (recheck 2D echo 1 to 2 months out post cardioversion.      Relevant Medications   valsartan -hydrochlorothiazide  (DIOVAN -HCT) 160-25 MG tablet   metoprolol  succinate (TOPROL -XL) 50 MG 24 hr tablet   amiodarone  (PACERONE ) 200 MG tablet   Other Relevant Orders   EKG 12-Lead (Completed)   Basic metabolic panel with GFR   CBC   Ambulatory referral to Cardiac Electrophysiology     Other   DOE (dyspnea on exertion)   Likely related to combination of atrial fibs but also with reduced EF. Will see how he does hopefully with attempted cardioversion out of A-fib.  Plan will be facilitated with amiodarone .  Increasing afterload reduction with plans to further titrate as needed. = > With elevated blood pressures, we will increase valsartan -HCTZ to double dose (equivalent to 320 mg - 25 mg dosing, and consider spironolactone follow-up.  He is euvolemic and antibiotic related to diuretic. Reassessing EF post cardioversion with plans to potentially consider ischemic evaluation pending results..       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  Has been on Eliquis  with no bleeding issues.  Has not missed any doses.  For now he needs to stay on it until his cardioversion completed and then maintain use for at least 4 weeks post  cardioversion.  Following that 1 month post cardioversion, would be okay to temporarily hold for procedures or surgeries 2 to 3 days.  For instance, if you stop for cardiac catheterization, would stop~2 days/4 doses prior to cath.      OSA (obstructive sleep apnea) (Chronic)   Continue CPAP      Other Visit Diagnoses       Pre-procedure lab exam       Relevant Orders   Basic metabolic panel with GFR   CBC       Patient's records: Stop Metoprolol  Tartrate Start Metoprolol  Succinate 50 mg twice daily Increase Valsartan -hydrochlorothiazide  160-25 mg  to 2 tablets daily Take Amiodarone  400 mg twice a day for one week, then Amiodarone  400 mg once a day for one week, then Amiodarone  200 mg a day for 2 weeks  DCCV 10/12/2023 with Dr. Lonni. => Close follow-up after cardioversion with plans to reassess EF and consider referral to EP.      Informed Consent   Shared Decision Making/Informed Consent The risks, including but not limited to, [bleeding or vascular complications (1 in 500), pneumothorax (1 in 1600), arrhythmia (1 in 1000) and death (1 in 5000)], benefits (diagnostic support and/or management of heart failure, pulmonary hypertension) and alternatives of a right heart catheterization were discussed in detail with Mr. Gilmer and he is willing to proceed. The risks [stroke (1 in 1000), death (1 in 1000), kidney failure [usually temporary] (1 in 500), bleeding (1 in 200), allergic reaction [possibly serious] (1 in 200)], benefits (diagnostic support and management of coronary artery disease) and alternatives of a cardiac catheterization were discussed in detail with Mr. Weathers and he is  willing to proceed.   The risks (stroke, cardiac arrhythmias rarely resulting in the need for a temporary or permanent pacemaker, skin irritation or burns and complications associated with conscious sedation including aspiration, arrhythmia, respiratory failure and death), benefits (restoration of normal sinus rhythm) and alternatives of a direct current cardioversion were explained in detail to Mr. Beverley and he agrees to proceed.        Follow-Up: Return in about 2 weeks (around 10/19/2023) for Follow-up with APP, followed by alternate 3 months with APP, 6 month with MD.  I spent 58 minutes in the care of Joel Payne today including reviewing labs (1 minutes), reviewing studies (echocardiogram and monitor reviewed-images of echo reviewed along prior echocardiogram and Myoview  as well as cath-6 minutes), face to face time discussing treatment options (36 minutes), reviewing records from my last note as well as Previous evaluation in 2017  (4 months), 11 minutes dictating, and documenting in the encounter.      Signed, Alm MICAEL Clay, MD, MS Alm Clay, M.D., M.S. Interventional Cardiologist  [facility] Pager # [phone]

## 2023-10-05 NOTE — Assessment & Plan Note (Signed)
 Confirmed by Zio patch monitor, with 100% time in atrial fibrillation and heart rate ranging from 62 to 176 bpm. Contributes to heart failure and reduced ejection fraction. Cardioversion is reasonable to attempt restoration of sinus rhythm. Amiodarone  is planned to facilitate cardioversion and maintain sinus rhythm. Risks include potential for ventricular tachycardia or heart block, mitigated by hospital setting and anticoagulation with Xarelto . - Continue Xarelto  20 mg daily for anticoagulation. - Initiate amiodarone  for facility DCCV:  Loading dose: 400 mg BID for one week, then 400 mg daily for one week, followed by 200 mg daily for two weeks.  - Set up for cardioversion next week. - Refer to electrophysiology for further management of atrial fibrillation. - Reassess ejection fraction after cardioversion (recheck 2D echo 1 to 2 months out post cardioversion.

## 2023-10-05 NOTE — Assessment & Plan Note (Signed)
 Hypertensive today.- Hypertension management is crucial given heart failure and atrial fibrillation. - Increase valsartan -HCT to 320-25 mg daily as per heart failure plan. - Switch metoprolol  to tartrate to metoprolol  succinate 50 mg twice daily - Once on adequate dose of ARB, can consider adding spironolactone and potentially converting to Entresto based on low EF.

## 2023-10-05 NOTE — Patient Instructions (Signed)
 Medication Instructions:  Stop Metoprolol  Tartrate Start Metoprolol  Succinate 50 mg twice daily Increase Valsartan -hydrochlorothiazide  160-25 mg  to 2 tablets daily Take Amiodarone  400 mg twice a day for one week, then Amiodarone  400 mg once a day for one week, then Amiodarone  200 mg a day for 2 weeks *If you need a refill on your cardiac medications before your next appointment, please call your pharmacy*  Lab Work: CBC, BMP If you have labs (blood work) drawn today and your tests are completely normal, you will receive your results only by: MyChart Message (if you have MyChart) OR A paper copy in the mail If you have any lab test that is abnormal or we need to change your treatment, we will call you to review the results.  Testing/Procedures:    Dear Joel Payne  You are scheduled for a Cardioversion on Tuesday, September 30 with Dr. Lonni.  Please arrive at the [facility] (Main Entrance A) at [facility]: [address] [place], [place] [address] at 7:30 AM (This time is 1 hour(s) before your procedure to ensure your preparation).   Free valet parking service is available. You will check in at ADMITTING.   *Please Note: You will receive a call the day before your procedure to confirm the appointment time. That time may have changed from the original time based on the schedule for that day.*   DIET:  Nothing to eat or drink after midnight except a sip of water with medications (see medication instructions below)  MEDICATION INSTRUCTIONS: !!IF ANY NEW MEDICATIONS ARE STARTED AFTER TODAY, PLEASE NOTIFY YOUR PROVIDER AS SOON AS POSSIBLE!!  FYI: Medications such as Semaglutide (Ozempic, [place]), Tirzepatide (Mounjaro, Zepbound), Dulaglutide (Trulicity), etc (GLP1 agonists) AND Canagliflozin (Invokana), Dapagliflozin (Farxiga), Empagliflozin (Jardiance), Ertugliflozin (Steglatro), Bexagliflozin [employer]) or any combination with one of these drugs such as Invokamet  (Canagliflozin/Metformin), Synjardy (Empagliflozin/Metformin), etc (SGLT2 inhibitors) must be held around the time of a procedure. This is not a comprehensive list of all of these drugs. Please review all of your medications and talk to your provider if you take any one of these. If you are not sure, ask your provider.         :1}Continue taking your anticoagulant (blood thinner): Rivaroxaban  (Xarelto ).   You will need to continue this after your procedure until you are told by your provider that it is safe to stop.    LABS: CBC, BMP today  FYI:  For your safety, and to allow us  to monitor your vital signs accurately during the surgery/procedure we request: If you have artificial nails, gel coating, SNS etc, please have those removed prior to your surgery/procedure. Not having the nail coverings /polish removed may result in cancellation or delay of your surgery/procedure.  Your support person will be asked to wait in the waiting room during your procedure.  It is OK to have someone drop you off and come back when you are ready to be discharged.  You cannot drive after the procedure and will need someone to drive you home.  Bring your insurance cards.  *Special Note: Every effort is made to have your procedure done on time. Occasionally there are emergencies that occur at the hospital that may cause delays. Please be patient if a delay does occur.      Follow-Up: At [facility], you and your health needs are our priority.  As part of our continuing mission to provide you with exceptional heart care, our providers are all part  of one team.  This team includes your primary Cardiologist (physician) and Advanced Practice Providers or APPs (Physician Assistants and Nurse Practitioners) who all work together to provide you with the care you need, when you need it.  Your next appointment:    1 week after Cardioversion= 10/12/23 with either Aline Door, PA-C, Jon Hails, PA-C, or  Damien Braver, NP    We recommend signing up for the patient portal called MyChart.  Sign up information is provided on this After Visit Summary.  MyChart is used to connect with patients for Virtual Visits (Telemedicine).  Patients are able to view lab/test results, encounter notes, upcoming appointments, etc.  Non-urgent messages can be sent to your provider as well.   To learn more about what you can do with MyChart, go to [email or web address].

## 2023-10-06 ENCOUNTER — Encounter: Payer: Self-pay | Admitting: Cardiology

## 2023-10-06 LAB — CBC
Hematocrit: 48.8 % (ref 37.5–51.0)
Hemoglobin: 16.5 g/dL (ref 13.0–17.7)
MCH: 30.4 pg (ref 26.6–33.0)
MCHC: 33.8 g/dL (ref 31.5–35.7)
MCV: 90 fL (ref 79–97)
Platelets: 215 x10E3/uL (ref 150–450)
RBC: 5.42 x10E6/uL (ref 4.14–5.80)
RDW: 13.9 % (ref 11.6–15.4)
WBC: 6.5 x10E3/uL (ref 3.4–10.8)

## 2023-10-06 LAB — BASIC METABOLIC PANEL WITH GFR
BUN/Creatinine Ratio: 14 (ref 10–24)
BUN: 11 mg/dL (ref 8–27)
CO2: 25 mmol/L (ref 20–29)
Calcium: 9.3 mg/dL (ref 8.6–10.2)
Chloride: 98 mmol/L (ref 96–106)
Creatinine, Ser: 0.8 mg/dL (ref 0.76–1.27)
Glucose: 94 mg/dL (ref 70–99)
Potassium: 4.1 mmol/L (ref 3.5–5.2)
Sodium: 140 mmol/L (ref 134–144)
eGFR: 91 mL/min/1.73 (ref >59)

## 2023-10-06 NOTE — Telephone Encounter (Signed)
 Per Dr Anner   would wait (dont want to confuse Sx with Amio   RN called patient  to inform him of Dr Anner response. Patient verbalized understanding.  RN  answered patient question about the length of stay for cardioversion.  Patient will be going home the same day. If procedure states  in the morning  - he probably  leave earlier to mid afternoon. Patient voiced understanding

## 2023-10-07 ENCOUNTER — Other Ambulatory Visit: Payer: Self-pay

## 2023-10-07 ENCOUNTER — Encounter: Payer: Self-pay | Admitting: Allergy

## 2023-10-07 ENCOUNTER — Other Ambulatory Visit: Payer: Self-pay | Admitting: *Deleted

## 2023-10-07 ENCOUNTER — Ambulatory Visit: Admitting: Allergy

## 2023-10-07 ENCOUNTER — Other Ambulatory Visit: Payer: Self-pay | Admitting: Internal Medicine

## 2023-10-07 VITALS — BP 134/80 | HR 89 | Temp 97.9°F | Resp 19 | Ht 66.0 in | Wt 213.5 lb

## 2023-10-07 DIAGNOSIS — I4819 Other persistent atrial fibrillation: Secondary | ICD-10-CM

## 2023-10-07 DIAGNOSIS — J309 Allergic rhinitis, unspecified: Secondary | ICD-10-CM | POA: Diagnosis not present

## 2023-10-07 DIAGNOSIS — J4520 Mild intermittent asthma, uncomplicated: Secondary | ICD-10-CM | POA: Diagnosis not present

## 2023-10-07 DIAGNOSIS — R0982 Postnasal drip: Secondary | ICD-10-CM

## 2023-10-07 DIAGNOSIS — E876 Hypokalemia: Secondary | ICD-10-CM

## 2023-10-07 MED ORDER — ALBUTEROL SULFATE HFA 108 (90 BASE) MCG/ACT IN AERS: INHALATION_SPRAY | RESPIRATORY_TRACT | 1 refills | Status: DC

## 2023-10-07 MED ORDER — IPRATROPIUM BROMIDE 0.06 % NA SOLN: 2.0000 | Freq: Four times a day (QID) | NASAL | 11 refills | Status: AC | PRN

## 2023-10-07 MED ORDER — FLUTICASONE PROPIONATE HFA 110 MCG/ACT IN AERO: 2.0000 | INHALATION_SPRAY | Freq: Two times a day (BID) | RESPIRATORY_TRACT | 11 refills | Status: AC

## 2023-10-07 MED ORDER — LEVOCETIRIZINE DIHYDROCHLORIDE 5 MG PO TABS: 5.0000 mg | ORAL_TABLET | Freq: Every day | ORAL | 11 refills | Status: AC

## 2023-10-07 NOTE — Patient Instructions (Addendum)
-   Continue avoidance measures for grasses, trees, indoor molds, outdoor molds, dust mites, and cockroach. - Continue Xyzal  (levocetirizine) 5mg  tablet once daily. - Continue Atrovent  nasal spray 2 sprays each nostril up to 3-4 times a day as needed for nasal drainage/drip control - You can use an extra dose of the antihistamine, if needed, for breakthrough symptoms.  - Continue nasal saline rinses 3-7 times a week to remove allergens from the nasal cavities as well as help with mucous clearance (this is especially helpful to do before the nasal sprays are given)  - Daily controller medication(s): Fluticasone  (Flovent ) 2 puffs once daily.  Rinse mouth after use. - Rescue medications: albuterol  2 puffs every 4-6 hours as needed  - Asthma control goals:  * Full participation in all desired activities (may need albuterol  before activity) * Albuterol  use two time or less a week on average (not counting use with activity) * Cough interfering with sleep two time or less a month * Oral steroids no more than once a year * No hospitalizations  Follow-up 12 months or sooner if needed

## 2023-10-07 NOTE — Progress Notes (Signed)
 Follow-up Note  RE: Joel Payne MRN: [id number] DOB: 1945/03/18 Date of Office Visit: 10/07/2023   History of present illness: Joel Payne is a 78 y.o. male presenting today for follow-up of allergic rhinitis with PND.  He was last seen in the office on 04/07/23 by myself.   Discussed the use of AI scribe software for clinical note transcription with the patient, who gave verbal consent to proceed.  He has been managing his environmental allergies well despite a challenging year with high pollen counts and outdoor mold. His nose sometimes feels stuffy at night, but he does not experience significant issues during the day. He uses a nasal rinse, fluticasone  once daily and takes a generic form of Claritin at night. He also has a rescue inhaler, which he has not needed to use. He traveled during the summer, [place] in [place]  and two weeks in [place] , but did not experience additional allergy  issues in these locations.     Review of systems: 10pt ROS negative unless noted on HPI  Past medical/social/surgical/family history have been reviewed and are unchanged unless specifically indicated below.  No changes  Medication List: Current Outpatient Medications  Medication Sig Dispense Refill   albuterol  (VENTOLIN  HFA) 108 (90 Base) MCG/ACT inhaler INHALE 2 PUFFS BY MOUTH EVERY 4 TO 6 HOURS AS NEEDED 8.5 each 1   amiodarone  (PACERONE ) 200 MG tablet Take 400 mg twice a day for one week, then take 400 mg daily for one week, then take 200 mg daily for two weeks. 90 tablet 3   cyanocobalamin  (VITAMIN B12) 1000 MCG/ML injection INJECT 0.1 MLS INTO THE MUSCLE EVERY 30 DAYS. 3 mL 7   fluticasone  (FLOVENT  HFA) 110 MCG/ACT inhaler Inhale 2 puffs into the lungs 2 (two) times daily. 12 g 2   ipratropium (ATROVENT ) 0.06 % nasal spray PLACE 2 SPRAYS INTO BOTH NOSTRILS 4 (FOUR) TIMES DAILY AS NEEDED (NASAL DRAINAGE). 45 mL 1   levocetirizine (XYZAL ) 5 MG tablet Take 1 tablet (5  mg total) by mouth daily. 30 tablet 5   metoprolol  succinate (TOPROL -XL) 50 MG 24 hr tablet Take 1 tablet (50 mg total) by mouth in the morning and at bedtime. Take with or immediately following a meal. 180 tablet 3   OVER THE COUNTER MEDICATION Vitamin D3 1000 mcg take 2 cap daily     potassium chloride  SA (KLOR-CON  M) 20 MEQ tablet TAKE 1 TABLET BY MOUTH EVERY DAY 90 tablet 1   pravastatin  (PRAVACHOL ) 20 MG tablet TAKE 1 TABLET BY MOUTH EVERY DAY 90 tablet 1   rivaroxaban  (XARELTO ) 20 MG TABS tablet Take 1 tablet (20 mg total) by mouth daily with supper. 90 tablet 1   SYRINGE-NEEDLE, DISP, 3 ML (B-D 3CC LUER-LOK SYR 25GX1) 25G X 1 3 ML MISC USE FOR B12 INJECTIONS 100 each 3   triamcinolone  cream (KENALOG ) 0.1 % Apply 1 Application topically 3 (three) times daily. 453 g 0   valsartan -hydrochlorothiazide  (DIOVAN -HCT) 160-25 MG tablet Take 2 tablets by mouth daily. 60 tablet 11   azelastine  (ASTELIN ) 0.1 % nasal spray Place 2 sprays into both nostrils as directed. 2 sprays  each nostril 1 to 2 times a day as needed for nasal drainage control (Patient not taking: Reported on 10/07/2023) 30 mL 5   No current facility-administered medications for this visit.     Known medication allergies: Allergies  Allergen Reactions   Simvastatin  Other (See Comments)    Leg cramps  Physical examination: Blood pressure 134/80, pulse 89, temperature 97.9 F (36.6 C), resp. rate 19, height 5' 6 (1.676 m), weight 213 lb 8 oz (96.8 kg), SpO2 96%.  General: Alert, interactive, in no acute distress. HEENT: PERRLA, TMs pearly gray, turbinates non-edematous without discharge, post-pharynx non erythematous. Neck: Supple without lymphadenopathy. Lungs: Clear to auscultation without wheezing, rhonchi or rales. {no increased work of breathing. CV: Normal S1, S2 without murmurs. Abdomen: Nondistended, nontender. Skin: Warm and dry, without lesions or rashes. Extremities:  No clubbing, cyanosis or  edema. Neuro:   Grossly intact.  Diagnostics/Labs:  Spirometry: FEV1: 1.62 L 61%, FVC: 2.51 L 71% predicted.  This is quite stable for him.  Assessment and plan: Allergic rhinitis Postnasal drip  - Continue avoidance measures for grasses, trees, indoor molds, outdoor molds, dust mites, and cockroach. - Continue Xyzal  (levocetirizine) 5mg  tablet once daily. - Continue Atrovent  nasal spray 2 sprays each nostril up to 3-4 times a day as needed for nasal drainage/drip control - You can use an extra dose of the antihistamine, if needed, for breakthrough symptoms.  - Continue nasal saline rinses 3-7 times a week to remove allergens from the nasal cavities as well as help with mucous clearance (this is especially helpful to do before the nasal sprays are given)  Reactive airway, mild intermittent Under control - Daily controller medication(s): Fluticasone  (Flovent ) 2 puffs once daily.  Rinse mouth after use. - Rescue medications: albuterol  2 puffs every 4-6 hours as needed  - Asthma control goals:  * Full participation in all desired activities (may need albuterol  before activity) * Albuterol  use two time or less a week on average (not counting use with activity) * Cough interfering with sleep two time or less a month * Oral steroids no more than once a year * No hospitalizations  Follow-up 12 months or sooner if needed  I appreciate the opportunity to take part in Joel Payne's care. Please do not hesitate to contact me with questions.  Sincerely,   Danita Brain, MD Allergy /Immunology [facility]

## 2023-10-11 ENCOUNTER — Telehealth: Payer: Self-pay | Admitting: Cardiology

## 2023-10-11 ENCOUNTER — Ambulatory Visit: Payer: Self-pay | Admitting: Cardiology

## 2023-10-11 NOTE — Telephone Encounter (Signed)
 Spoke with pt who is reporting since he has been taking Metoprolol  his exhaustion is better/gone.  He is asking for an EKG.  Advised pt they will complete one in the AM before his procedure to verify if the cardioversion is needed or not.  Pt states understanding and was appreciative of the call and information.

## 2023-10-11 NOTE — Telephone Encounter (Signed)
 Pt would like to speak with Nurse about getting a EKG appt. He states he has been taking   metoprolol  succinate (TOPROL -XL) 50 MG 24 hr tablet                                                                                                   And feels like the procedure is no longer needed please Advise

## 2023-10-12 ENCOUNTER — Encounter (HOSPITAL_COMMUNITY): Payer: Self-pay | Admitting: Cardiology

## 2023-10-12 ENCOUNTER — Other Ambulatory Visit: Payer: Self-pay

## 2023-10-12 ENCOUNTER — Ambulatory Visit (HOSPITAL_COMMUNITY)
Admission: RE | Admit: 2023-10-12 | Discharge: 2023-10-12 | Disposition: A | Attending: Cardiology | Admitting: Cardiology

## 2023-10-12 ENCOUNTER — Ambulatory Visit (HOSPITAL_BASED_OUTPATIENT_CLINIC_OR_DEPARTMENT_OTHER): Admitting: Anesthesiology

## 2023-10-12 ENCOUNTER — Encounter (HOSPITAL_COMMUNITY)
Admission: RE | Disposition: A | Discharge status: Home / Self Care | Payer: Self-pay | Source: Home / Self Care | Attending: Cardiology

## 2023-10-12 ENCOUNTER — Ambulatory Visit (HOSPITAL_COMMUNITY): Admitting: Anesthesiology

## 2023-10-12 DIAGNOSIS — Z8673 Personal history of transient ischemic attack (TIA), and cerebral infarction without residual deficits: Secondary | ICD-10-CM | POA: Diagnosis not present

## 2023-10-12 DIAGNOSIS — I5022 Chronic systolic (congestive) heart failure: Secondary | ICD-10-CM | POA: Insufficient documentation

## 2023-10-12 DIAGNOSIS — G4733 Obstructive sleep apnea (adult) (pediatric): Secondary | ICD-10-CM | POA: Insufficient documentation

## 2023-10-12 DIAGNOSIS — I4819 Other persistent atrial fibrillation: Secondary | ICD-10-CM | POA: Diagnosis not present

## 2023-10-12 DIAGNOSIS — I10 Essential (primary) hypertension: Secondary | ICD-10-CM | POA: Diagnosis not present

## 2023-10-12 DIAGNOSIS — I110 Hypertensive heart disease with heart failure: Secondary | ICD-10-CM | POA: Insufficient documentation

## 2023-10-12 DIAGNOSIS — I2510 Atherosclerotic heart disease of native coronary artery without angina pectoris: Secondary | ICD-10-CM

## 2023-10-12 DIAGNOSIS — I4891 Unspecified atrial fibrillation: Secondary | ICD-10-CM

## 2023-10-12 DIAGNOSIS — D6869 Other thrombophilia: Secondary | ICD-10-CM | POA: Insufficient documentation

## 2023-10-12 DIAGNOSIS — E785 Hyperlipidemia, unspecified: Secondary | ICD-10-CM | POA: Diagnosis not present

## 2023-10-12 DIAGNOSIS — I420 Dilated cardiomyopathy: Secondary | ICD-10-CM | POA: Insufficient documentation

## 2023-10-12 DIAGNOSIS — Z7901 Long term (current) use of anticoagulants: Secondary | ICD-10-CM | POA: Insufficient documentation

## 2023-10-12 DIAGNOSIS — Z79899 Other long term (current) drug therapy: Secondary | ICD-10-CM | POA: Diagnosis not present

## 2023-10-12 HISTORY — PX: CARDIOVERSION: EP1203

## 2023-10-12 SURGERY — CARDIOVERSION (CATH LAB)
Anesthesia: General

## 2023-10-12 MED ORDER — LIDOCAINE 2% (20 MG/ML) 5 ML SYRINGE
INTRAMUSCULAR | Status: DC | PRN
  Administered 2023-10-12: 80 mg via INTRAVENOUS

## 2023-10-12 MED ORDER — SODIUM CHLORIDE 0.9 % IV SOLN: INTRAVENOUS | Status: DC

## 2023-10-12 MED ORDER — PROPOFOL 10 MG/ML IV BOLUS
INTRAVENOUS | Status: DC | PRN
  Administered 2023-10-12: 80 mg via INTRAVENOUS

## 2023-10-12 SURGICAL SUPPLY — 1 items: PAD DEFIB RADIO PHYSIO CONN (PAD) ×2 IMPLANT

## 2023-10-12 NOTE — Anesthesia Preprocedure Evaluation (Addendum)
 Anesthesia Evaluation    Airway Mallampati: II  TM Distance: >3 FB Neck ROM: Full    Dental  (+) Dental Advisory Given   Pulmonary sleep apnea    Pulmonary exam normal breath sounds clear to auscultation       Cardiovascular hypertension, Pt. on medications Normal cardiovascular exam+ dysrhythmias Atrial Fibrillation  Rhythm:Regular Rate:Normal     Neuro/Psych TIA   GI/Hepatic   Endo/Other    Renal/GU      Musculoskeletal   Abdominal  (+) + obese  Peds  Hematology   Anesthesia Other Findings   Reproductive/Obstetrics                              Anesthesia Physical Anesthesia Plan  ASA: 3  Anesthesia Plan: General   Post-op Pain Management:    Induction: Intravenous  PONV Risk Score and Plan: 2 and Propofol infusion and Treatment may vary due to age or medical condition  Airway Management Planned: Nasal Cannula  Additional Equipment:   Intra-op Plan:   Post-operative Plan:   Informed Consent: I have reviewed the patients History and Physical, chart, labs and discussed the procedure including the risks, benefits and alternatives for the proposed anesthesia with the patient or authorized representative who has indicated his/her understanding and acceptance.     Dental advisory given  Plan Discussed with: CRNA  Anesthesia Plan Comments:         Anesthesia Quick Evaluation

## 2023-10-12 NOTE — Anesthesia Procedure Notes (Signed)
 Procedure Name: MAC Date/Time: 10/12/2023 8:42 AM  Performed by: Viviana Almarie DASEN, CRNAPre-anesthesia Checklist: Patient identified, Suction available, Patient being monitored, Emergency Drugs available and Timeout performed Patient Re-evaluated:Patient Re-evaluated prior to induction Oxygen Delivery Method: Nasal cannula Induction Type: IV induction

## 2023-10-12 NOTE — Anesthesia Postprocedure Evaluation (Signed)
 Anesthesia Post Note  Patient: Joel Payne  Procedure(s) Performed: CARDIOVERSION     Patient location during evaluation: PACU Anesthesia Type: General Level of consciousness: awake and alert Pain management: pain level controlled Vital Signs Assessment: post-procedure vital signs reviewed and stable Respiratory status: spontaneous breathing, nonlabored ventilation and respiratory function stable Cardiovascular status: blood pressure returned to baseline and stable Postop Assessment: no apparent nausea or vomiting Anesthetic complications: no   No notable events documented.  Last Vitals:  Vitals:   10/12/23 0925 10/12/23 0930  BP:  (!) 153/93  Pulse: (!) 57 65  Resp: 18 19  Temp:    SpO2: 95% 96%    Last Pain:  Vitals:   10/12/23 0930  TempSrc:   PainSc: 0-No pain                 Butler Levander Pinal

## 2023-10-12 NOTE — Transfer of Care (Signed)
 Immediate Anesthesia Transfer of Care Note  Patient: Joel Payne  Procedure(s) Performed: CARDIOVERSION  Patient Location: PACU and Cath Lab  Anesthesia Type:MAC  Level of Consciousness: drowsy  Airway & Oxygen Therapy: Patient Spontanous Breathing and Patient connected to nasal cannula oxygen  Post-op Assessment: Report given to RN, Patient moving all extremities X 4, and Patient able to stick tongue midline  Post vital signs: Reviewed and stable  Last Vitals:  Vitals Value Taken Time  BP 130/86   Temp 36.5   Pulse 75 10/12/23 08:40  Resp 22 10/12/23 08:40  SpO2 94 % 10/12/23 08:40  Vitals shown include unfiled device data.  Last Pain:  Vitals:   10/12/23 0800  TempSrc:   PainSc: 0-No pain         Complications: No notable events documented.

## 2023-10-12 NOTE — Interval H&P Note (Signed)
 History and Physical Interval Note:  10/12/2023 7:58 AM  Joel Payne  has presented today for surgery, with the diagnosis of AFIB.  The various methods of treatment have been discussed with the patient and family. After consideration of risks, benefits and other options for treatment, the patient has consented to  Procedure(s): CARDIOVERSION (N/A) as a surgical intervention.  The patient's history has been reviewed, patient examined, no change in status, stable for surgery.  I have reviewed the patient's chart and labs.  Questions were answered to the patient's satisfaction.     Krystn Dermody Lonni

## 2023-10-12 NOTE — CV Procedure (Signed)
 Procedure:   DCCV  Indication:  Symptomatic atrial fibrillation  Procedure Note:  The patient signed informed consent.  They have had had therapeutic anticoagulation with rivroxaban greater than 3 weeks.  Anesthesia was administered by Dr. Cleotilde.  Patient received 80 mg IV lidocaine and 80 mg IV propofol.Adequate airway was maintained throughout and vital followed per protocol.  They were cardioverted x 1 with 200J of biphasic synchronized energy and anterior pressure.  They converted to NSR.  There were no apparent complications.  The patient had normal neuro status and respiratory status post procedure with vitals stable as recorded elsewhere.    Follow up:  They will continue on current medical therapy and follow up with cardiology as scheduled.  Shelda Bruckner, MD PhD 10/12/2023 9:14 AM

## 2023-10-15 DIAGNOSIS — G4733 Obstructive sleep apnea (adult) (pediatric): Secondary | ICD-10-CM | POA: Diagnosis not present

## 2023-10-15 DIAGNOSIS — R400 Somnolence: Secondary | ICD-10-CM | POA: Diagnosis not present

## 2023-10-15 NOTE — Progress Notes (Signed)
 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  Joel Payne is a 78 year old male with atrial fibrillation who presents with fatigue and heart arrhythmia management. He is accompanied by his wife, [name]. He was referred by Dr. Andrews, his primary care physician, for cardiology evaluation.  He has been experiencing significant fatigue for about a year, describing it as feeling 'really tired all the time' and 'exhausted'. Despite wearing a heart monitor for two weeks during the summer, he did not experience any symptoms during the monitoring period. An echocardiogram revealed a reduced ejection fraction of 30-40% and persistent AF.  He underwent a cardioversion on October 12, 2023, which initially improved his symptoms significantly, reducing his fatigue. He is currently on amiodarone  and has completed the outpatient load and is now reduced to 200 mg daily.  His medical history includes a transient ischemic attack approximately ten years ago, which was when atrial fibrillation was first noted. He has been on blood thinners since then to prevent stroke. He also has a history of sleep apnea and uses a CPAP machine, which he notes has helped manage his condition. He experiences swelling in his ankles, which worsens throughout the day but is not present in the mornings.  No chest pain or palpitations.   He lives locally with his wife.  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  ROS: fatigue, palpitations   Studies Reviewed  ECG  review 10/18/23: AF/VR 88, QRS 100, QT/c 422/510 10/12/23: NSR 61, PR 176, QRS 106, QT/c 470/473, frequent PACs (post DCCV) 10/05/23: AF/VR 98, QRS 98, QT/c 358/457 08/02/23: AF/VR 94, QRS 92, QT/c 354/442 11/05/15: NSR 64, PR 134, QRS 88, QT/c 452/466  TTE Result date: 09/27/23  1. Left ventricular ejection fraction, by estimation, is 35 to 40%. The  left ventricle has moderately decreased function. The left ventricle  demonstrates global hypokinesis. Left ventricular diastolic function could  not be evaluated.   2. Right ventricular systolic function is normal. The right ventricular  size is normal. There is mildly elevated pulmonary artery systolic  pressure.   3. Left atrial size was moderately to severely dilated.   4. Right atrial size was moderately dilated.   5. The mitral valve is normal in structure. Trivial mitral valve  regurgitation. No evidence of mitral stenosis.   6. Low stroke volume underestimates aortic valve stenosis (low flow low  gradient). Based on AVA and DI, likely moderate stenosis. The aortic valve  is tricuspid. There is moderate calcification of the aortic valve. There  is moderate thickening of the  aortic valve. Aortic valve regurgitation is moderate. Moderate aortic  valve stenosis.   7. Aortic dilatation noted. There is mild dilatation of the ascending  aorta, measuring 42 mm.   8. The inferior vena cava is normal in size with greater than 50%  respiratory variability, suggesting right atrial pressure of 3 mmHg.   9. Evidence of atrial level shunting detected by color flow Doppler.  There is a small patent foramen ovale with predominantly left to right  shunting across  the atrial septum.   Zio monitor  Result date: 08/06/23-08/20/23 3 Ventricular Tachycardia runs occurred, the run with the fastest interval lasting 5 beats with a max rate of 200 bpm, the longest lasting 9 beats with an avg rate of 148 bpm. Atrial Fibrillation occurred continuously (  100% burden) , ranging from 62- 176 bpm ( avg of 99 bpm) . Isolated VEs were rare ( < 1. 0% , 16263) , VE Couplets were rare ( < 1. 0% , 730) , and VE Triplets were rare ( < 1. 0% , 5) . Ventricular Bigeminy was present.  Risk Assessment/Calculations  CHA2DS2-VASc Score = 7  This indicates a 11.2% annual risk of stroke. The patient's score is based upon: CHF History: 1 HTN History: 1 Diabetes History: 0 Stroke History: 2 Vascular Disease History: 1 Age Score: 2 Gender Score: 0  Physical Exam VS:  BP (!) 154/82   Pulse 89   Ht 5' 6 (1.676 m)   Wt 213 lb 14.4 oz (97 kg)   SpO2 97%   BMI 34.52 kg/m   Wt Readings from Last 3 Encounters:  10/18/23 213 lb 14.4 oz (97 kg)  10/07/23 213 lb 8 oz (96.8 kg)  10/05/23 214 lb (97.1 kg)    GEN: Well nourished, well developed in no acute distress NECK: No JVD; No carotid bruits CARDIAC: irregular rhythm, rate controlled, no murmurs, rubs, gallops RESPIRATORY:  Clear to auscultation without rales, wheezing or rhonchi  EXTREMITIES:  No edema; No deformity   ASSESSMENT AND PLAN 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  Longstanding Persistent atrial fibrillation with left atrial enlargement and reduced left ventricular ejection fraction Persistent atrial fibrillation with significant left atrial enlargement and reduced left ventricular ejection fraction. The condition has likely been ongoing for over a year persistently, with intermittent episodes possibly occurring for a longer period. Recent cardioversion was initially successful, but atrial fibrillation has recurred. The left atrium is significantly enlarged, complicating rhythm control. Amiodarone  has been initiated but is not yet at full therapeutic levels. He is currently in rate-controlled atrial fibrillation with  a ventricular rate of 88 bpm. The enlarged left atrium suggests a chronic burden of atrial fibrillation, and the reduced ejection fraction indicates compromised cardiac function. A combination of medication and ablation is recommended for optimal rhythm control. Risks of ablation include bleeding from the IV site and rare complications such as stroke or damage to the heart wall, with serious risks occurring in 1 in 500 to 1,000 cases. These risks are minimized with the newer pulse field ablation technique, which reduces procedure time and recovery. The anticipated outcome is improved rhythm control and potential reduction in atrial size over time, though complete resolution of atrial fibrillation is not expected. Success rates for longstanding persistent atrial fibrillation are 50-60% for significant reduction in AF burden. - Schedule pulse field ablation in early to mid-November. - Continue amiodarone  as prescribed, reducing to 200 mg daily. - Maintain current anticoagulation with Xarelto , taking it in the evening as usual. - Obtain [employer] for home monitoring of atrial fibrillation. - Advise on no heavy lifting or submersion in water for one week post-ablation. - Discuss potential need for repeat cardioversion or further intervention if atrial fibrillation persists post-ablation.   Discussed treatment options today for AF including antiarrhythmic drug therapy and ablation. Discussed risks, recovery  and likelihood of success with each treatment strategy. Risk, benefits, and alternatives to EP study and ablation for afib were discussed. These risks include but are not limited to stroke, bleeding, vascular damage, tamponade, perforation, damage to the esophagus, lungs, phrenic nerve and other structures, worsening renal function, coronary vasospasm and death.  Discussed potential need for repeat ablation procedures and antiarrhythmic drugs after an initial ablation. The patient understands these risk and  wishes to proceed.  We will therefore proceed with catheter ablation at the next available time.  Carto, ICE, anesthesia are requested for the procedure.   Pre procedure details: Schedule 11/29/23 Carto/ICE/GA-anesthesia, no CT scan pre procedure  Continue uninterrupted Xarelto  (takes nightly), no held doses pre procedure Continue amiodarone    Dispo: RTC post ablation   A total of 80 minutes was spent preparing for the patient, reviewing history, performing exam, document encounter, coordinating care and counseling the patient. 67 minutes was spent with direct patient care.   Signed, Donnice DELENA Primus, MD

## 2023-10-18 ENCOUNTER — Encounter: Payer: Self-pay | Admitting: Student in an Organized Health Care Education/Training Program

## 2023-10-18 ENCOUNTER — Encounter: Payer: Self-pay | Admitting: Cardiology

## 2023-10-18 ENCOUNTER — Ambulatory Visit
Attending: Student in an Organized Health Care Education/Training Program | Admitting: Student in an Organized Health Care Education/Training Program

## 2023-10-18 VITALS — BP 154/82 | HR 89 | Ht 66.0 in | Wt 213.9 lb

## 2023-10-18 DIAGNOSIS — I4819 Other persistent atrial fibrillation: Secondary | ICD-10-CM

## 2023-10-18 DIAGNOSIS — Z01812 Encounter for preprocedural laboratory examination: Secondary | ICD-10-CM

## 2023-10-18 NOTE — Patient Instructions (Signed)
 Medication Instructions:  Your physician recommends that you continue on your current medications as directed. Please refer to the Current Medication list given to you today.  *If you need a refill on your cardiac medications before your next appointment, please call your pharmacy*  Testing/Procedures: Ablation Your physician has recommended that you have an ablation. Catheter ablation is a medical procedure used to treat some cardiac arrhythmias (irregular heartbeats). During catheter ablation, a long, thin, flexible tube is put into a blood vessel in your groin (upper thigh), or neck. This tube is called an ablation catheter. It is then guided to your heart through the blood vessel. Radio frequency waves destroy small areas of heart tissue where abnormal heartbeats may cause an arrhythmia to start.   You are scheduled for Atrial Fibrillation Ablation on Monday, November 17 with Dr. Dr. Almetta. Please arrive at the Main Entrance A at [facility]: [address] [place], [place] [address] at 5:30 AM   What To Expect:  Labs: you will need to have lab work drawn within 30 days of your procedure. Please go to any [facility] location to have these drawn - no appointment is needed. You will receive procedure instructions either through MyChart or in the mail 4-6 week prior to your procedure.  After your procedure we recommend no driving for 4 days, no lifting over 5 lbs for 7 days, and no work or strenuous activity for 7 days.  Please contact our office at [phone] if you have any questions.    Follow-Up: We will contact you to schedule your post-procedure appointments.

## 2023-10-19 ENCOUNTER — Telehealth: Payer: Self-pay | Admitting: Student in an Organized Health Care Education/Training Program

## 2023-10-19 NOTE — Telephone Encounter (Signed)
 Pt requesting to reschedule his Ablation procedure.

## 2023-10-30 DIAGNOSIS — G4733 Obstructive sleep apnea (adult) (pediatric): Secondary | ICD-10-CM | POA: Diagnosis not present

## 2023-11-16 ENCOUNTER — Telehealth: Payer: Self-pay

## 2023-11-16 NOTE — Telephone Encounter (Signed)
-----   Message from Nurse Aldona R sent at 10/25/2023  6:48 PM EDT ----- Regarding: 12/1 Afib Almetta Important: list procedure date as first item in subject line, followed by procedure type (e.g., 09/24/23 AFib ablation)  Precert:  MD: Almetta Type of ablation: A-fib Diagnosis: Afib CPT code: A-fib (06343) Ablation scheduled (date/time): 12/1 730  Procedure:  Added to calendar? Yes Orders entered? No, >30 days before procedure Letter complete? Yes Scheduled with cath lab? Yes Any medications to hold? No Labs ordered (CBC, BMET, PT/INR if on warfarin): Yes Mapping system: CARTO (lab 4 or 6) CARTO/OPAL rep notified? Yes Cardiac CT needed? No Dye allergy ? No Pre-meds ordered and instructions given? N/a Letter method: MyChart H&P: 10/06 Device: No  Follow-up:  Cassie/Angel, please schedule Routine.  Covering RN - please send this message to [employer], EP scheduler, EP Scheduling pool, EP [employer], and CT scheduler (Brittany Lynch/Stephanie Mogg), if indicated.

## 2023-11-19 ENCOUNTER — Telehealth (HOSPITAL_COMMUNITY): Payer: Self-pay

## 2023-11-19 ENCOUNTER — Encounter (HOSPITAL_COMMUNITY): Payer: Self-pay

## 2023-11-19 DIAGNOSIS — Z01812 Encounter for preprocedural laboratory examination: Secondary | ICD-10-CM | POA: Diagnosis not present

## 2023-11-19 DIAGNOSIS — I4819 Other persistent atrial fibrillation: Secondary | ICD-10-CM | POA: Diagnosis not present

## 2023-11-19 LAB — CBC

## 2023-11-19 NOTE — Telephone Encounter (Signed)
 Attempted to reach patient to discuss upcoming procedure, no answer. Left VM for patient to return call.

## 2023-11-19 NOTE — Telephone Encounter (Signed)
 Spoke with patient to complete pre-procedure call.     Health status review:  Any new medical conditions, recent signs of acute illness or been started on antibiotics? No Any recent hospitalizations or surgeries? No Any new medications started since pre-op visit? No  Follow all medication instructions prior to procedure or the procedure may be rescheduled:    Continue taking Xarelto  (Rivaroxaban ) once daily without missing any doses before procedure. Essential chronic medications:  No medication should be continued, unless told otherwise. On the morning of your procedure DO NOT take any medication., including Xarelto  (Rivaroxaban ).  Nothing to eat or drink after midnight prior to your procedure.  Pre-procedure testing scheduled: reports lab work completed today,  November 7.  Confirmed patient is scheduled for Atrial Fibrillation Ablation on Monday, December 1 with Dr. Dr. Almetta. Instructed patient to arrive at the Main Entrance A at [facility]: [address] [place], [place] [address] and check in at Admitting at 5:30 AM.  Plan to go home the same day, you will only stay overnight if medically necessary. You MUST have a responsible adult to drive you home and MUST be with you the first 24 hours after you arrive home or your procedure could be cancelled.  Informed a nurse may call a day before the procedure to confirm arrival time and ensure instructions are followed.  Patient verbalized understanding to information provided and is agreeable to proceed with procedure.   Advised to contact RN Navigator at [phone], to inform of any new medications started after call or concerns prior to procedure.

## 2023-11-20 LAB — CBC
Hematocrit: 46.1 % (ref 37.5–51.0)
Hemoglobin: 15.3 g/dL (ref 13.0–17.7)
MCH: 29.7 pg (ref 26.6–33.0)
MCHC: 33.2 g/dL (ref 31.5–35.7)
MCV: 90 fL (ref 79–97)
Platelets: 222 x10E3/uL (ref 150–450)
RBC: 5.15 x10E6/uL (ref 4.14–5.80)
RDW: 13 % (ref 11.6–15.4)
WBC: 5.6 x10E3/uL (ref 3.4–10.8)

## 2023-11-20 LAB — BASIC METABOLIC PANEL WITH GFR
BUN/Creatinine Ratio: 13 (ref 10–24)
BUN: 14 mg/dL (ref 8–27)
CO2: 28 mmol/L (ref 20–29)
Calcium: 9 mg/dL (ref 8.6–10.2)
Chloride: 97 mmol/L (ref 96–106)
Creatinine, Ser: 1.05 mg/dL (ref 0.76–1.27)
Glucose: 96 mg/dL (ref 70–99)
Potassium: 3.9 mmol/L (ref 3.5–5.2)
Sodium: 139 mmol/L (ref 134–144)
eGFR: 73 mL/min/1.73 (ref >59)

## 2023-11-30 DIAGNOSIS — G4733 Obstructive sleep apnea (adult) (pediatric): Secondary | ICD-10-CM | POA: Diagnosis not present

## 2023-12-10 NOTE — Pre-Procedure Instructions (Signed)
 Instructed patient on the following items: Arrival time 0515 Nothing to eat or drink after midnight No meds AM of procedure Responsible person to drive you home and stay with you for 24 hrs  Have you missed any doses of anti-coagulant Xarelto - takes once a day, hasn't missed any doses in last 4 weeks.

## 2023-12-11 ENCOUNTER — Encounter (HOSPITAL_COMMUNITY): Payer: Self-pay | Admitting: Student in an Organized Health Care Education/Training Program

## 2023-12-13 ENCOUNTER — Ambulatory Visit (HOSPITAL_COMMUNITY)
Admission: RE | Disposition: A | Discharge status: Home / Self Care | Payer: Self-pay | Source: Home / Self Care | Attending: Student in an Organized Health Care Education/Training Program

## 2023-12-13 ENCOUNTER — Encounter (HOSPITAL_COMMUNITY): Payer: Self-pay | Admitting: Anesthesiology

## 2023-12-13 ENCOUNTER — Ambulatory Visit (HOSPITAL_COMMUNITY)
Admission: RE | Admit: 2023-12-13 | Discharge: 2023-12-13 | Disposition: A | Discharge status: Home / Self Care | Attending: Student in an Organized Health Care Education/Training Program | Admitting: Student in an Organized Health Care Education/Training Program

## 2023-12-13 ENCOUNTER — Ambulatory Visit (HOSPITAL_BASED_OUTPATIENT_CLINIC_OR_DEPARTMENT_OTHER): Payer: Self-pay | Admitting: Anesthesiology

## 2023-12-13 ENCOUNTER — Other Ambulatory Visit: Payer: Self-pay

## 2023-12-13 DIAGNOSIS — Z7901 Long term (current) use of anticoagulants: Secondary | ICD-10-CM | POA: Insufficient documentation

## 2023-12-13 DIAGNOSIS — Z8673 Personal history of transient ischemic attack (TIA), and cerebral infarction without residual deficits: Secondary | ICD-10-CM | POA: Diagnosis not present

## 2023-12-13 DIAGNOSIS — I480 Paroxysmal atrial fibrillation: Secondary | ICD-10-CM | POA: Diagnosis not present

## 2023-12-13 DIAGNOSIS — I110 Hypertensive heart disease with heart failure: Secondary | ICD-10-CM | POA: Diagnosis not present

## 2023-12-13 DIAGNOSIS — I2510 Atherosclerotic heart disease of native coronary artery without angina pectoris: Secondary | ICD-10-CM

## 2023-12-13 DIAGNOSIS — Z79899 Other long term (current) drug therapy: Secondary | ICD-10-CM | POA: Diagnosis not present

## 2023-12-13 DIAGNOSIS — I5022 Chronic systolic (congestive) heart failure: Secondary | ICD-10-CM | POA: Insufficient documentation

## 2023-12-13 DIAGNOSIS — I77819 Aortic ectasia, unspecified site: Secondary | ICD-10-CM | POA: Insufficient documentation

## 2023-12-13 DIAGNOSIS — G4730 Sleep apnea, unspecified: Secondary | ICD-10-CM | POA: Diagnosis not present

## 2023-12-13 DIAGNOSIS — I4811 Longstanding persistent atrial fibrillation: Secondary | ICD-10-CM

## 2023-12-13 DIAGNOSIS — I509 Heart failure, unspecified: Secondary | ICD-10-CM

## 2023-12-13 HISTORY — PX: ATRIAL FIBRILLATION ABLATION: EP1191

## 2023-12-13 LAB — POCT ACTIVATED CLOTTING TIME: Activated Clotting Time: 388 s

## 2023-12-13 SURGERY — ATRIAL FIBRILLATION ABLATION
Anesthesia: General

## 2023-12-13 MED ORDER — SODIUM CHLORIDE 0.9 % IV SOLN: 250.0000 mL | INTRAVENOUS | Status: DC | PRN

## 2023-12-13 MED ORDER — PHENYLEPHRINE 80 MCG/ML (10ML) SYRINGE FOR IV PUSH (FOR BLOOD PRESSURE SUPPORT)
PREFILLED_SYRINGE | INTRAVENOUS | Status: DC | PRN
  Administered 2023-12-13: 120 ug via INTRAVENOUS
  Administered 2023-12-13: 80 ug via INTRAVENOUS

## 2023-12-13 MED ORDER — PROPOFOL 10 MG/ML IV BOLUS
INTRAVENOUS | Status: DC | PRN
  Administered 2023-12-13: 120 mg via INTRAVENOUS

## 2023-12-13 MED ORDER — HEPARIN (PORCINE) IN NACL 1000-0.9 UT/500ML-% IV SOLN
INTRAVENOUS | Status: DC | PRN
  Administered 2023-12-13 (×2): 500 mL

## 2023-12-13 MED ORDER — ROCURONIUM BROMIDE 10 MG/ML (PF) SYRINGE
PREFILLED_SYRINGE | INTRAVENOUS | Status: DC | PRN
  Administered 2023-12-13: 50 mg via INTRAVENOUS
  Administered 2023-12-13: 20 mg via INTRAVENOUS

## 2023-12-13 MED ORDER — ONDANSETRON HCL 4 MG/2ML IJ SOLN
INTRAMUSCULAR | Status: DC | PRN
  Administered 2023-12-13: 4 mg via INTRAVENOUS

## 2023-12-13 MED ORDER — FENTANYL CITRATE (PF) 250 MCG/5ML IJ SOLN
INTRAMUSCULAR | Status: DC | PRN
  Administered 2023-12-13: 100 ug via INTRAVENOUS

## 2023-12-13 MED ORDER — PHENYLEPHRINE HCL-NACL 20-0.9 MG/250ML-% IV SOLN
INTRAVENOUS | Status: DC | PRN
  Administered 2023-12-13: 15 ug/min via INTRAVENOUS

## 2023-12-13 MED ORDER — DEXAMETHASONE SOD PHOSPHATE PF 10 MG/ML IJ SOLN
INTRAMUSCULAR | Status: DC | PRN
  Administered 2023-12-13: 10 mg via INTRAVENOUS

## 2023-12-13 MED ORDER — SODIUM CHLORIDE 0.9 % IV SOLN: INTRAVENOUS | Status: DC

## 2023-12-13 MED ORDER — SUGAMMADEX SODIUM 200 MG/2ML IV SOLN
INTRAVENOUS | Status: DC | PRN
  Administered 2023-12-13: 100 mg via INTRAVENOUS
  Administered 2023-12-13: 200 mg via INTRAVENOUS

## 2023-12-13 MED ORDER — HEPARIN SODIUM (PORCINE) 1000 UNIT/ML IJ SOLN
INTRAMUSCULAR | Status: DC | PRN
  Administered 2023-12-13: 20000 [IU] via INTRAVENOUS

## 2023-12-13 MED ORDER — LIDOCAINE 2% (20 MG/ML) 5 ML SYRINGE
INTRAMUSCULAR | Status: DC | PRN
  Administered 2023-12-13: 30 mg via INTRAVENOUS

## 2023-12-13 MED ORDER — SODIUM CHLORIDE 0.9% FLUSH: 3.0000 mL | Freq: Two times a day (BID) | INTRAVENOUS | Status: DC

## 2023-12-13 MED ORDER — SODIUM CHLORIDE 0.9 % IV SOLN: INTRAVENOUS | Status: DC | PRN

## 2023-12-13 MED ORDER — HEPARIN SODIUM (PORCINE) 1000 UNIT/ML IJ SOLN
INTRAMUSCULAR | Status: DC | PRN
  Administered 2023-12-13: 1000 [IU] via INTRAVENOUS

## 2023-12-13 MED ORDER — PROTAMINE SULFATE 10 MG/ML IV SOLN
INTRAVENOUS | Status: DC | PRN
  Administered 2023-12-13: 40 mg via INTRAVENOUS

## 2023-12-13 MED ORDER — FENTANYL CITRATE (PF) 100 MCG/2ML IJ SOLN
INTRAMUSCULAR | Status: AC
  Filled 2023-12-13: qty 2

## 2023-12-13 MED ORDER — HEPARIN SODIUM (PORCINE) 1000 UNIT/ML IJ SOLN
INTRAMUSCULAR | Status: AC
  Filled 2023-12-13: qty 10

## 2023-12-13 MED ORDER — SODIUM CHLORIDE 0.9% FLUSH: 3.0000 mL | INTRAVENOUS | Status: DC | PRN

## 2023-12-13 SURGICAL SUPPLY — 16 items
CABLE VARIPULSE STERILE (CATHETERS) IMPLANT
CATH GE 8FR SOUNDSTAR (CATHETERS) IMPLANT
CATH OCTARAY 2.0 F 3-3-3-3-3 (CATHETERS) IMPLANT
CATH WEBSTER BI DIR CS D-F CRV (CATHETERS) IMPLANT
CATHETER VARIPULSE 8.5FR (CATHETERS) IMPLANT
CLOSURE PERCLOSE PROSTYLE (Vascular Products) IMPLANT
COVER SWIFTLINK CONNECTOR (BAG) ×2 IMPLANT
KIT VERSACROSS 8.5F 63 45D 180 (KITS) IMPLANT
PACK EP LF (CUSTOM PROCEDURE TRAY) ×2 IMPLANT
PAD DEFIB RADIO PHYSIO CONN (PAD) ×2 IMPLANT
PATCH CARTO3 (PAD) IMPLANT
SHEATH CARTO VIZIGO SM CVD (SHEATH) IMPLANT
SHEATH PINNACLE 8F 10CM (SHEATH) IMPLANT
SHEATH PINNACLE 9F 10CM (SHEATH) IMPLANT
SHEATH PROBE COVER 6X72 (BAG) IMPLANT
TUBING SMART ABLATE COOLFLOW (TUBING) IMPLANT

## 2023-12-13 NOTE — Progress Notes (Signed)
  Pt arrived from EP via Bed to HA 20. Report received from RN and CRNA. Pt vitals are stable, performed q49min see vitals flowsheet. Anesthesia recovery has been uneventful. Pt to transition to short stay for remainder of recovery. Will continue to monitor patient while under holding area care.

## 2023-12-13 NOTE — Progress Notes (Addendum)
 Up and walked and tolerated well; right groin stable, not bleeding or hematoma; Dr Almetta in and ok to d/c home

## 2023-12-13 NOTE — Anesthesia Postprocedure Evaluation (Signed)
 Anesthesia Post Note  Patient: Joel Payne  Procedure(s) Performed: ATRIAL FIBRILLATION ABLATION     Patient location during evaluation: Cath Lab Anesthesia Type: General Level of consciousness: awake Pain management: pain level controlled Vital Signs Assessment: post-procedure vital signs reviewed and stable Respiratory status: spontaneous breathing, nonlabored ventilation and respiratory function stable Cardiovascular status: blood pressure returned to baseline and stable Postop Assessment: no apparent nausea or vomiting Anesthetic complications: no   There were no known notable events for this encounter.  Last Vitals:  Vitals:   12/13/23 0954 12/13/23 0955  BP:  130/73  Pulse:  61  Resp:  16  Temp:    SpO2: 93% 94%    Last Pain:  Vitals:   12/13/23 0925  TempSrc: Skin  PainSc: 0-No pain                 Airyn Ellzey P Tkai Large

## 2023-12-13 NOTE — H&P (Signed)
 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   Joel Payne is a 78 year old male with atrial fibrillation who presents with fatigue and heart arrhythmia management. He is accompanied by his wife, [name]. He was referred by Dr. Andrews, his primary care physician, for cardiology evaluation.   He has been experiencing significant fatigue for about a year, describing it as feeling 'really tired all the time' and 'exhausted'. Despite wearing a heart monitor for two weeks during the summer, he did not experience any symptoms during the monitoring period. An echocardiogram revealed a reduced ejection fraction of 30-40% and persistent AF.   He underwent a cardioversion on October 12, 2023, which initially improved his symptoms significantly, reducing his fatigue. He is currently on amiodarone  and has completed the outpatient load and is now reduced to 200 mg daily.   His medical history includes a transient ischemic attack approximately ten years ago, which was when atrial fibrillation was first noted. He has been on blood thinners since then to prevent stroke. He also has a history of sleep apnea and uses a CPAP machine, which he notes has helped manage his condition. He experiences swelling in his ankles, which worsens throughout the day but is not present in the mornings.   No chest pain or palpitations.    He lives locally with his wife.  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   ROS: fatigue, palpitations    Studies  Reviewed  ECG review 10/18/23: AF/VR 88, QRS 100, QT/c 422/510 10/12/23: NSR 61, PR 176, QRS 106, QT/c 470/473, frequent PACs (post DCCV) 10/05/23: AF/VR 98, QRS 98, QT/c 358/457 08/02/23: AF/VR 94, QRS 92, QT/c 354/442 11/05/15: NSR 64, PR 134, QRS 88, QT/c 452/466   TTE Result date: 09/27/23  1. Left ventricular ejection fraction, by estimation, is 35 to 40%. The  left ventricle has moderately decreased function. The left ventricle  demonstrates global hypokinesis. Left ventricular diastolic function could  not be evaluated.   2. Right ventricular systolic function is normal. The right ventricular  size is normal. There is mildly elevated pulmonary artery systolic  pressure.   3. Left atrial size was moderately to severely dilated.   4. Right atrial size was moderately dilated.   5. The mitral valve is normal in structure. Trivial mitral valve  regurgitation. No evidence of mitral stenosis.   6. Low stroke volume underestimates aortic valve stenosis (low flow low  gradient). Based on AVA and DI, likely moderate stenosis. The aortic valve  is tricuspid. There is moderate calcification of the aortic valve. There  is moderate thickening of the  aortic valve. Aortic valve regurgitation is moderate. Moderate aortic  valve stenosis.   7. Aortic dilatation noted. There is mild dilatation of the ascending  aorta, measuring 42 mm.   8. The inferior vena cava is normal in size with greater than 50%  respiratory variability, suggesting right atrial pressure of 3 mmHg.   9. Evidence of atrial level shunting detected by color flow Doppler.  There is a small patent  foramen ovale with predominantly left to right  shunting across the atrial septum.    Zio monitor  Result date: 08/06/23-08/20/23 3 Ventricular Tachycardia runs occurred, the run with the fastest interval lasting 5 beats with a max rate of 200 bpm, the longest lasting 9 beats with an avg rate of 148 bpm. Atrial Fibrillation  occurred continuously ( 100% burden) , ranging from 62- 176 bpm ( avg of 99 bpm) . Isolated VEs were rare ( < 1. 0% , 16263) , VE Couplets were rare ( < 1. 0% , 730) , and VE Triplets were rare ( < 1. 0% , 5) . Ventricular Bigeminy was present.   Risk Assessment/Calculations   CHA2DS2-VASc Score = 7  This indicates a 11.2% annual risk of stroke. The patient's score is based upon: CHF History: 1 HTN History: 1 Diabetes History: 0 Stroke History: 2 Vascular Disease History: 1 Age Score: 2 Gender Score: 0   Physical Exam VS:  BP (!) 154/82   Pulse 89   Ht 5' 6 (1.676 m)   Wt 213 lb 14.4 oz (97 kg)   SpO2 97%   BMI 34.52 kg/m       Wt Readings from Last 3 Encounters:  10/18/23 213 lb 14.4 oz (97 kg)  10/07/23 213 lb 8 oz (96.8 kg)  10/05/23 214 lb (97.1 kg)    GEN: Well nourished, well developed in no acute distress NECK: No JVD; No carotid bruits CARDIAC: irregular rhythm, rate controlled, no murmurs, rubs, gallops RESPIRATORY:  Clear to auscultation without rales, wheezing or rhonchi  EXTREMITIES:  No edema; No deformity    ASSESSMENT AND PLAN 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   Longstanding Persistent atrial fibrillation with left atrial enlargement and reduced left ventricular ejection fraction Persistent atrial fibrillation with significant left atrial enlargement and reduced left ventricular ejection fraction. The condition has likely been ongoing for over a year persistently, with intermittent episodes possibly occurring for a longer period. Recent cardioversion was initially successful, but atrial fibrillation has recurred. The left atrium is significantly enlarged, complicating rhythm control. Amiodarone  has been initiated but is not yet at full therapeutic levels. He is currently in  rate-controlled atrial fibrillation with a ventricular rate of 88 bpm. The enlarged left atrium suggests a chronic burden of atrial fibrillation, and the reduced ejection fraction indicates compromised cardiac function. A combination of medication and ablation is recommended for optimal rhythm control. Risks of ablation include bleeding from the IV site and rare complications such as stroke or damage to the heart wall, with serious risks occurring in 1 in 500 to 1,000 cases. These risks are minimized with the newer pulse field ablation technique, which reduces procedure time and recovery. The anticipated outcome is improved rhythm control and potential reduction in atrial size over time, though complete resolution of atrial fibrillation is not expected. Success rates for longstanding persistent atrial fibrillation are 50-60% for significant reduction in AF burden. - Schedule pulse field ablation in early to mid-November. - Continue amiodarone  as prescribed, reducing to 200 mg daily. - Maintain current anticoagulation with Xarelto , taking it in the evening as usual. - Obtain [employer]ardia[employer] for home monitoring of atrial fibrillation. - Advise on no heavy lifting or submersion in water for one week post-ablation. - Discuss potential need for repeat cardioversion or further intervention if atrial  fibrillation persists post-ablation.    Discussed treatment options today for AF including antiarrhythmic drug therapy and ablation. Discussed risks, recovery and likelihood of success with each treatment strategy. Risk, benefits, and alternatives to EP study and ablation for afib were discussed. These risks include but are not limited to stroke, bleeding, vascular damage, tamponade, perforation, damage to the esophagus, lungs, phrenic nerve and other structures, worsening renal function, coronary vasospasm and death.  Discussed potential need for repeat ablation procedures and antiarrhythmic drugs after an initial  ablation. The patient understands these risk and wishes to proceed.  We will therefore proceed with catheter ablation at the next available time.  Carto, ICE, anesthesia are requested for the procedure.    Pre procedure details: Schedule: 12/13/23 Carto/ICE/GA-anesthesia, no CT scan pre procedure  Continue uninterrupted Xarelto  (takes nightly), no held doses pre procedure Continue amiodarone 

## 2023-12-13 NOTE — Discharge Instructions (Signed)

## 2023-12-13 NOTE — Anesthesia Procedure Notes (Signed)
 Procedure Name: Intubation Date/Time: 12/13/2023 7:45 AM  Performed by: Evette Ade, CRNAPre-anesthesia Checklist: Patient identified, Emergency Drugs available, Suction available, Patient being monitored and Timeout performed Patient Re-evaluated:Patient Re-evaluated prior to induction Oxygen Delivery Method: Circle system utilized Preoxygenation: Pre-oxygenation with 100% oxygen Induction Type: IV induction Ventilation: Mask ventilation without difficulty and Oral airway inserted - appropriate to patient size Laryngoscope Size: Mac and 4 Grade View: Grade II Tube type: Oral Tube size: 7.5 mm Number of attempts: 1 Airway Equipment and Method: Stylet Placement Confirmation: ETT inserted through vocal cords under direct vision Secured at: 21 cm Tube secured with: Tape Dental Injury: Teeth and Oropharynx as per pre-operative assessment

## 2023-12-13 NOTE — Op Note (Signed)
   Procedure:  Intracardiac catheter ablation AFIB including Transseptal Cath (CPT 93656)  Pre-Op Diagnosis: longstanding persistent AF  Post-Op Diagnosis: same   Procedure Date:  12/13/23   Attending: Adina Primus, MD   Anesthesia: general anesthesia   Initial Intervals: AF, QRS 104, QT 341, RR 668   Procedure: The patient entered the EP lab in a fasting nonsedated state. The procedural time-out was achieved. The patient was placed under general anesthesia by Anesthesiology. Once the patient was draped and prepped in normal fashion with multiple layers of Hibiclens scrub in the bilateral groins, access to the veins was achieved under ultrasound guidance using the modified Seldinger technique. Following access both sites were pre closed with Perclose ProStyle suture mediated closure.    Access/sheath: - RFV: 8 Fr short sheath->8.5 Fr Sm curl Vizigo - RFV: 9 Fr short sheath   Catheters: - 8.5 Fr SoundStar ICE - Varipulse  PFA catheter - OctaRay 3-3-3-3-3 mapping catheter  - Webster CS D/F decapolar catheter  Transseptal Access Systemic heparinization was given to maintain ACT's >350. Transseptal puncture was performed with the VersaCross wire through the Vizigo sheath using ICE and fluoroscopy. The sheath was carefully aspirated and flushed.      Mapping: Next, a deflectable OctaRay was advanced into the left atrium through the Vizigo sheath. DCCV (200J x1) to NSR briefly with immediate return to AF. A 3-dimensional electroanatomic map was constructed of the left atrium and pulmonary veins using the Carto mapping system with AF.    Ablation: The OctaRay was removed from the Vizigo sheath and the Carto Varipulse PFA catheter was advanced to the LSPV os. The PFA catheter was advanced to each of the 4 pulmonary veins and at least 4 PFA applications were applied per vein. Additional lesions were applied across both carinas and the RPV septum. Following initial ablation, DCCV (200J x1)  was repeated with sustained NSR. Repeat mapping with the Varipulse catheter confirmed first pass isolation in all 4 veins with entrance and exit block in each vein. The PW had normal voltage in NSR.    Heparinization was then reversed with protamine. Catheters and sheaths were pulled and hemostasis obtained with Perclose ProStyle sutures. No complications were evident. Final ICE assessment without pericardial effusion. The patient was transported to post procedure holding.   Final intervals: NSR, PR 164, QRS 101, QT 478, RR 1462  Summary: AF on arrival   Successful transseptal puncture x 1 with ICE guidance  DCCV (200J x1) to NSR briefly with return to AF 3-D mapping of the LA and PV's in AF Successful PV isolation (WACA) using J&J Varipulse PFA  Repeat mapping post ablation with entrance/exit block and normal PW voltage    Recommendations: Bedrest x 2 hours  Anti-coagulation: Resume rivaroxaban  20 mg tonight Anti-platelet: none Anti-arrhythmic: continue amio 200 mg daily x30 days then d/c if no recurrent AF Rate control: continue OP toprol  XL 50 mg daily with reduced LVEF Repeat TTE in 3 months to assess for LVEF recovery  EP f/u to be scheduled   Donnice DELENA Primus, MD [facility]  Cardiac Electrophysiology

## 2023-12-13 NOTE — Anesthesia Preprocedure Evaluation (Addendum)
 Anesthesia Evaluation  Patient identified by MRN, date of birth, ID band Patient awake    Reviewed: Allergy  & Precautions, NPO status , Patient's Chart, lab work & pertinent test results  Airway Mallampati: II  TM Distance: >3 FB Neck ROM: Full    Dental no notable dental hx.    Pulmonary sleep apnea and Continuous Positive Airway Pressure Ventilation    Pulmonary exam normal        Cardiovascular hypertension, Pt. on home beta blockers and Pt. on medications + CAD and +CHF  Normal cardiovascular exam+ dysrhythmias Atrial Fibrillation + Valvular Problems/Murmurs   ECHO:  1. Left ventricular ejection fraction, by estimation, is 35 to 40%. The  left ventricle has moderately decreased function. The left ventricle  demonstrates global hypokinesis. Left ventricular diastolic function could  not be evaluated.   2. Right ventricular systolic function is normal. The right ventricular  size is normal. There is mildly elevated pulmonary artery systolic  pressure.   3. Left atrial size was moderately to severely dilated.   4. Right atrial size was moderately dilated.   5. The mitral valve is normal in structure. Trivial mitral valve  regurgitation. No evidence of mitral stenosis.   6. Low stroke volume underestimates aortic valve stenosis (low flow low  gradient). Based on AVA and DI, likely moderate stenosis. The aortic valve  is tricuspid. There is moderate calcification of the aortic valve. There  is moderate thickening of the  aortic valve. Aortic valve regurgitation is moderate. Moderate aortic  valve stenosis.   7. Aortic dilatation noted. There is mild dilatation of the ascending  aorta, measuring 42 mm.   8. The inferior vena cava is normal in size with greater than 50%  respiratory variability, suggesting right atrial pressure of 3 mmHg.   9. Evidence of atrial level shunting detected by color flow Doppler.  There is a small  patent foramen ovale with predominantly left to right  shunting across the atrial septum.     Neuro/Psych TIACVA, No Residual Symptoms  negative psych ROS   GI/Hepatic negative GI ROS, Neg liver ROS,,,  Endo/Other  negative endocrine ROS    Renal/GU      Musculoskeletal negative musculoskeletal ROS (+)    Abdominal  (+) + obese  Peds  Hematology  (+) Blood dyscrasia (Xarelto )   Anesthesia Other Findings A-fib  Reproductive/Obstetrics                              Anesthesia Physical Anesthesia Plan  ASA: 3  Anesthesia Plan: General   Post-op Pain Management:    Induction: Intravenous  PONV Risk Score and Plan: 2 and Ondansetron , Dexamethasone and Treatment may vary due to age or medical condition  Airway Management Planned: Oral ETT  Additional Equipment:   Intra-op Plan:   Post-operative Plan: Extubation in OR  Informed Consent: I have reviewed the patients History and Physical, chart, labs and discussed the procedure including the risks, benefits and alternatives for the proposed anesthesia with the patient or authorized representative who has indicated his/her understanding and acceptance.     Dental advisory given  Plan Discussed with: CRNA  Anesthesia Plan Comments:          Anesthesia Quick Evaluation

## 2023-12-13 NOTE — Transfer of Care (Signed)
 Immediate Anesthesia Transfer of Care Note  Patient: Joel Payne  Procedure(s) Performed: ATRIAL FIBRILLATION ABLATION  Patient Location: PACU  Anesthesia Type:General  Level of Consciousness: awake and alert   Airway & Oxygen Therapy: Patient Spontanous Breathing and Patient connected to nasal cannula oxygen  Post-op Assessment: Report given to RN and Post -op Vital signs reviewed and stable  Post vital signs: Reviewed and stable  Last Vitals:  Vitals Value Taken Time  BP 124/75   Temp    Pulse 60 12/13/23 09:27  Resp 19 12/13/23 09:27  SpO2 97 % 12/13/23 09:27  Vitals shown include unfiled device data.  Last Pain:  Vitals:   12/13/23 0552  TempSrc:   PainSc: 0-No pain         Complications: There were no known notable events for this encounter.

## 2023-12-13 NOTE — Interval H&P Note (Signed)
 History and Physical Interval Note:  12/13/2023 7:07 AM  Joel Payne  has presented today for surgery, with the diagnosis of afib.  The various methods of treatment have been discussed with the patient and family. After consideration of risks, benefits and other options for treatment, the patient has consented to  Procedure(s): ATRIAL FIBRILLATION ABLATION (N/A) as a surgical intervention.  The patient's history has been reviewed, patient examined, no change in status, stable for surgery.  I have reviewed the patient's chart and labs.  Questions were answered to the patient's satisfaction.    LD Xarelto  11/30 PM, LD amio 11/30 AM. Proceed with ablation.   Donnice DELENA Primus

## 2023-12-14 ENCOUNTER — Encounter (HOSPITAL_COMMUNITY): Payer: Self-pay | Admitting: Student in an Organized Health Care Education/Training Program

## 2023-12-14 ENCOUNTER — Telehealth (HOSPITAL_COMMUNITY): Payer: Self-pay

## 2023-12-14 NOTE — Telephone Encounter (Signed)
 Spoke with patient to complete post procedure follow up call.  Patient reports no complications with groin sites.   Instructions reviewed with patient:  Remove large bandage at puncture site after 24 hours. It is normal to have bruising, tenderness, mild swelling, and a pea or marble sized lump/knot at the groin site which can take up to three months to resolve.  Get help right away if you notice sudden swelling at the puncture site.  Check your puncture site every day for signs of infection: fever, redness, swelling, pus drainage, warmth, foul odor or excessive pain. If this occurs, please call [phone], to speak with the RN Navigator. Get help right away if your puncture site is bleeding and the bleeding does not stop after applying firm pressure to the area.  You may continue to have skipped beats/ atrial fibrillation during the first several months after your procedure.  It is very important not to miss any doses of your blood thinner Xarelto .    You will follow up with the Afib clinic 4 weeks after your procedure and follow up with the APP 3 months after your procedure.  Activity restrictions reviewed.  Patient verbalized understanding to all instructions provided.

## 2023-12-15 MED FILL — Fentanyl Citrate Preservative Free (PF) Inj 100 MCG/2ML: INTRAMUSCULAR | Qty: 2 | Status: CN

## 2023-12-15 MED FILL — Fentanyl Citrate Preservative Free (PF) Inj 100 MCG/2ML: INTRAMUSCULAR | Qty: 2 | Status: AC

## 2023-12-16 ENCOUNTER — Other Ambulatory Visit: Payer: Self-pay | Admitting: Internal Medicine

## 2023-12-16 DIAGNOSIS — I10 Essential (primary) hypertension: Secondary | ICD-10-CM

## 2024-01-02 ENCOUNTER — Other Ambulatory Visit: Payer: Self-pay | Admitting: Internal Medicine

## 2024-01-02 DIAGNOSIS — L309 Dermatitis, unspecified: Secondary | ICD-10-CM

## 2024-01-10 ENCOUNTER — Ambulatory Visit (HOSPITAL_COMMUNITY): Admitting: Internal Medicine

## 2024-01-17 DIAGNOSIS — I480 Paroxysmal atrial fibrillation: Secondary | ICD-10-CM

## 2024-01-25 ENCOUNTER — Other Ambulatory Visit: Payer: Self-pay | Admitting: Cardiology

## 2024-01-31 ENCOUNTER — Ambulatory Visit (HOSPITAL_COMMUNITY)
Admission: RE | Admit: 2024-01-31 | Discharge: 2024-01-31 | Disposition: A | Source: Ambulatory Visit | Attending: Internal Medicine | Admitting: Internal Medicine

## 2024-01-31 ENCOUNTER — Encounter (HOSPITAL_COMMUNITY): Payer: Self-pay | Admitting: Internal Medicine

## 2024-01-31 VITALS — BP 168/90 | HR 68 | Ht 67.0 in | Wt 217.4 lb

## 2024-01-31 DIAGNOSIS — Z79899 Other long term (current) drug therapy: Secondary | ICD-10-CM | POA: Diagnosis present

## 2024-01-31 DIAGNOSIS — I4819 Other persistent atrial fibrillation: Secondary | ICD-10-CM | POA: Diagnosis not present

## 2024-01-31 DIAGNOSIS — D6869 Other thrombophilia: Secondary | ICD-10-CM | POA: Insufficient documentation

## 2024-01-31 DIAGNOSIS — Z5181 Encounter for therapeutic drug level monitoring: Secondary | ICD-10-CM | POA: Insufficient documentation

## 2024-01-31 MED ORDER — FUROSEMIDE 20 MG PO TABS: 20.0000 mg | ORAL_TABLET | Freq: Every day | ORAL | 0 refills | Status: AC | PRN

## 2024-01-31 NOTE — Progress Notes (Signed)
 "   Primary Care Physician: Theophilus Andrews, Tully GRADE, MD Primary Cardiologist: Alm Clay, MD Electrophysiologist: Donnice DELENA Primus, MD     Referring Physician: Dr. Primus Rome Joel Payne is a 79 y.o. male with a history of HFrEF, severe LAE, OSA on CPAP, TIA, CAD, HTN, thoracic aorta dilation, moderate AS, and longstanding persistent atrial fibrillation who presents for consultation in the [facility]. Patient is on Xarelto  for stroke prevention.  On evaluation today, patient is currently in NSR. S/p Afib ablation on 12/13/23 by Dr. Primus. No episodes of Afib since ablation. He stopped taking amiodarone  30 days after ablation. No chest pain or SOB. Leg sites healed without issue. He has noted some swelling in bilateral LE since ablation. No missed doses of anticoagulant.  Today, he denies symptoms of orthopnea, PND, lower extremity edema, dizziness, presyncope, syncope, snoring, daytime somnolence, bleeding, or neurologic sequela. The patient is tolerating medications without difficulties and is otherwise without complaint today.    he has a BMI of Body mass index is 34.05 kg/m.SABRA Filed Weights   01/31/24 1529  Weight: 98.6 kg    Current Outpatient Medications  Medication Sig Dispense Refill   Cholecalciferol (VITAMIN D ) 50 MCG (2000 UT) tablet Take 2,000 Units by mouth daily.     cyanocobalamin  (VITAMIN B12) 1000 MCG/ML injection INJECT 0.1 MLS INTO THE MUSCLE EVERY 30 DAYS. 3 mL 7   fluticasone  (FLOVENT  HFA) 110 MCG/ACT inhaler Inhale 2 puffs into the lungs 2 (two) times daily. 12 g 11   furosemide  (LASIX ) 20 MG tablet Take 1 tablet (20 mg total) by mouth daily as needed. For 3 days for swelling 12 tablet 0   ipratropium (ATROVENT ) 0.06 % nasal spray Place 2 sprays into both nostrils 4 (four) times daily as needed (nasal drainage). (Patient taking differently: Place 2 sprays into both nostrils every evening.) 45 mL 11   levocetirizine  (XYZAL ) 5 MG tablet Take 1 tablet (5 mg total) by mouth daily. 30 tablet 11   metoprolol  succinate (TOPROL -XL) 50 MG 24 hr tablet Take 1 tablet (50 mg total) by mouth in the morning and at bedtime. Take with or immediately following a meal. 180 tablet 3   potassium chloride  SA (KLOR-CON  M) 20 MEQ tablet TAKE 1 TABLET BY MOUTH EVERY DAY 90 tablet 1   pravastatin  (PRAVACHOL ) 20 MG tablet TAKE 1 TABLET BY MOUTH EVERY DAY 90 tablet 1   triamcinolone  cream (KENALOG ) 0.1 % APPLY TOPICALLY 3 TIMES A DAY 454 g 0   valsartan -hydrochlorothiazide  (DIOVAN -HCT) 160-25 MG tablet TAKE 1 TABLET BY MOUTH EVERY DAY 90 tablet 0   XARELTO  20 MG TABS tablet TAKE 1 TABLET BY MOUTH DAILY WITH SUPPER. 90 tablet 1   No current facility-administered medications for this encounter.    Atrial Fibrillation Management history:  Previous antiarrhythmic drugs: amiodarone  Previous cardioversions: 10/12/23 Previous ablations: 12/13/23 Anticoagulation history: Xarelto    ROS- All systems are reviewed and negative except as per the HPI above.  Physical Exam: BP (!) 168/90   Pulse 68   Ht 5' 7 (1.702 m)   Wt 98.6 kg   BMI 34.05 kg/m   GEN: Well nourished, well developed in no acute distress NECK: No JVD; No carotid bruits CARDIAC: Regular rate and rhythm, no murmurs, rubs, gallops RESPIRATORY:  Clear to auscultation without rales, wheezing or rhonchi  ABDOMEN: Soft, non-tender, non-distended EXTREMITIES:  Right ankle > Left ankle, edema +1 non pitting   EKG today demonstrates  EKG Interpretation Date/Time:  Monday January 31 2024 15:31:18 EST Ventricular Rate:  68 PR Interval:  146 QRS Duration:  102 QT Interval:  444 QTC Calculation: 472 R Axis:   -6  Text Interpretation: Normal sinus rhythm Left ventricular hypertrophy with repolarization abnormality ( R in aVL , Cornell product , Romhilt-Estes ) Abnormal ECG When compared with ECG of 13-Dec-2023 09:29, No significant change was found Confirmed by Terra Pac [phone]) on 01/31/2024 3:45:16 PM        Echo 09/27/23 demonstrated  1. Left ventricular ejection fraction, by estimation, is 35 to 40%. The  left ventricle has moderately decreased function. The left ventricle  demonstrates global hypokinesis. Left ventricular diastolic function could  not be evaluated.   2. Right ventricular systolic function is normal. The right ventricular  size is normal. There is mildly elevated pulmonary artery systolic  pressure.   3. Left atrial size was moderately to severely dilated.   4. Right atrial size was moderately dilated.   5. The mitral valve is normal in structure. Trivial mitral valve  regurgitation. No evidence of mitral stenosis.   6. Low stroke volume underestimates aortic valve stenosis (low flow low  gradient). Based on AVA and DI, likely moderate stenosis. The aortic valve  is tricuspid. There is moderate calcification of the aortic valve. There  is moderate thickening of the  aortic valve. Aortic valve regurgitation is moderate. Moderate aortic  valve stenosis.   7. Aortic dilatation noted. There is mild dilatation of the ascending  aorta, measuring 42 mm.   8. The inferior vena cava is normal in size with greater than 50%  respiratory variability, suggesting right atrial pressure of 3 mmHg.   9. Evidence of atrial level shunting detected by color flow Doppler.  There is a small patent foramen ovale with predominantly left to right  shunting across the atrial septum.   ASSESSMENT & PLAN CHA2DS2-VASc Score = 7  The patient's score is based upon: CHF History: 1 HTN History: 1 Diabetes History: 0 Stroke History: 2 Vascular Disease History: 1 Age Score: 2 Gender Score: 0       ASSESSMENT AND PLAN: Longstanding Persistent Atrial Fibrillation (ICD10:  I48.11) The patient's CHA2DS2-VASc score is 7, indicating a 11.2% annual risk of stroke.   S/p Afib ablation on 12/13/23 by Almetta.  Patient is currently in NSR. We discussed  what to expect during recovery period following ablation. Patient already stopped amiodarone  at the first of the year. Continue Toprol  50 mg twice daily. Will provide short 3 day course of lasix  20 mg daily for edema. Recommended compression hose.  Secondary Hypercoagulable State (ICD10:  D68.69) The patient is at significant risk for stroke/thromboembolism based upon his CHA2DS2-VASc Score of 7.  Continue Rivaroxaban  (Xarelto ).  Continue Xarelto  20 mg daily without interruption during the blanking period.     Follow up with EP as scheduled.    Terra [facility], [facility]  Afib Clinic [address] [place], [place] [address] [phone]  "

## 2024-01-31 NOTE — Patient Instructions (Signed)
 Take furosemide  (LASIX ) 20 MG once daily for 3 days for swelling

## 2024-02-10 ENCOUNTER — Encounter: Payer: Self-pay | Admitting: Family Medicine

## 2024-02-10 ENCOUNTER — Ambulatory Visit: Admitting: Family Medicine

## 2024-02-10 NOTE — Progress Notes (Deleted)
 To check-in Nursing staff: Please review Meds, Allergies, PMH, and care teams and update Please request wt, home BP, etc and update vitals if able Please complete the following flowsheets under the Medicare Visits Tab:  Medicare Wellness  Stress  PHQ-2-9  Exercise  Social Connections  Method of visit: Not In Person  ----------------------------------------------------------------------------------------------------------------------------------------------------------------------------------------------------------------------  Because this visit was a virtual/telehealth visit, some criteria may be missing or patient reported. Any vitals not documented were not able to be obtained and vitals that have been documented are patient reported.    MEDICARE ANNUAL PREVENTIVE CARE VISIT WITH PROVIDER (Welcome to Medicare, initial annual wellness or annual wellness exam)  Virtual Visit via Video ***Phone Note  I connected with Joel Payne on 02/10/24  by phone *** a video enabled telemedicine application and verified that I am speaking with the correct person using two identifiers.  Location patient: home Location provider:work or home office Persons participating in the virtual visit: patient, provider  Concerns and/or follow up today: detailed intake and health/risks assessment completed on flow sheets and below- please see for details.   How often do you have a drink containing alcohol?never How many drinks containing alcohol do you have on a typical day when you are drinking?na How often do you have six or more drinks on one occasion?na Have you ever smoked?n Quit date if applicable? na  How many packs a day do/did you smoke? na Do you use smokeless tobacco?n Do you use an illicit drugs?n Do you feel safe at home?*** Last dentist visit?*** Last eye Exam and location? [facility]   See HM section in Epic for other details of completed HM.    ROS: negative for report of fevers,  unintentional weight loss, vision changes, vision loss, hearing loss or change, chest pain, sob, hemoptysis, melena, hematochezia, hematuria,bleeding or bruising  Patient-completed extensive health risk assessment - reviewed and discussed with the patient: See Health Risk Assessment completed with patient prior to the visit either above or in recent phone note. This was reviewed in detailed with the patient today and appropriate recommendations, orders and referrals were placed as needed per Summary below and patient instructions.   Review of Medical History: -PMH, PSH, Family History and current specialty and care providers reviewed and updated and listed below   Patient Care Team: Theophilus Andrews, Tully GRADE, MD as PCP - General (Internal Medicine) Anner Alm ORN, MD as PCP - Cardiology (Cardiology) Almetta Donnice LABOR, MD as PCP - Electrophysiology (Cardiology)   Past Medical History:  Diagnosis Date   ALLERGIC RHINITIS 11/08/2006   Allergy  2023   BENIGN PROSTATIC HYPERTROPHY 10/12/2006   CORONARY ARTERY DISEASE 10/12/2006   Catheterization was normal 2012, mild nonobstructive coronary disease, normal LV function   HYPERLIPIDEMIA 11/08/2006   HYPERTENSION 10/12/2006   Kidney stone    in 1980   NEPHROLITHIASIS, HX OF 10/12/2006   PPD positive    Shortness of breath    April, 2012  /  catheterization May, 2012 mild nonobstructive coronary disease   Stroke [facility])    TIA (transient ischemic attack) 10/2015   Urinary frequency 10/18/2009    Past Surgical History:  Procedure Laterality Date   ATRIAL FIBRILLATION ABLATION N/A 12/13/2023   Procedure: ATRIAL FIBRILLATION ABLATION;  Surgeon: Almetta Donnice LABOR, MD;  Location: [facility] INVASIVE CV LAB;  Service: Cardiovascular;  Laterality: N/A;   CARDIAC CATHETERIZATION  2012   CARDIOVERSION N/A 10/12/2023   Procedure: CARDIOVERSION;  Surgeon: Lonni Slain, MD;  Location: [facility] INVASIVE CV  LAB;  Service: Cardiovascular;  Laterality:  N/A;   non radical prostatectomy  08/2011   PROSTATE BIOPSY  2008   TEE WITHOUT CARDIOVERSION N/A 10/31/2015   Procedure: TRANSESOPHAGEAL ECHOCARDIOGRAM (TEE) will alos have loop;  Surgeon: Maude JAYSON Emmer, MD;  Location: [facility] ENDOSCOPY;  Service: Cardiovascular;  Laterality: N/A;   TONSILLECTOMY      Social History   Socioeconomic History   Marital status: Married    Spouse name: Not on file   Number of children: 3   Years of education: PhD   Highest education level: Doctorate  Occupational History   Occupation: Anthropology  Tobacco Use   Smoking status: Never   Smokeless tobacco: Never   Tobacco comments:    Never smoked 01/31/24  Vaping Use   Vaping status: Never Used  Substance and Sexual Activity   Alcohol use: Not Currently    Alcohol/week: 1.0 standard drink of alcohol    Comment: I stopped drinking five years ago.   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)   Social Drivers of Health   Tobacco Use: Low Risk (02/10/2024)   Patient History    Smoking Tobacco Use: Never    Smokeless Tobacco Use: Never    Passive Exposure: Not on file  Financial Resource Strain: Low Risk (02/10/2024)   Overall Financial Resource Strain (CARDIA)    Difficulty of Paying Living Expenses: Not hard at all  Food Insecurity: No Food Insecurity (02/10/2024)   Epic    Worried About Radiation Protection Practitioner of Food in the Last Year: Never true    Ran Out of Food in the Last Year: Never true  Transportation Needs: No Transportation Needs (02/10/2024)   Epic    Lack of Transportation (Medical): No    Lack of Transportation (Non-Medical): No  Physical Activity: Insufficiently Active (02/10/2024)   Exercise Vital Sign    Days of Exercise per Week: 1 day     Minutes of Exercise per Session: 20 min  Stress: No Stress Concern Present (02/10/2024)   [employer] of Occupational Health - Occupational Stress Questionnaire    Feeling of Stress: Only a little  Social Connections: Moderately Isolated (02/10/2024)   Social Connection and Isolation Panel    Frequency of Communication with Friends and Family: Three times a week    Frequency of Social Gatherings with Friends and Family: Once a week    Attends Religious Services: Never    Database Administrator or Organizations: No    Attends Engineer, Structural: Not on file    Marital Status: Married  Catering Manager Violence: Not At Risk (02/10/2024)   Epic    Fear of Current or Ex-Partner: No    Emotionally Abused: No    Physically Abused: No    Sexually Abused: No  Depression (PHQ2-9): Low Risk (02/10/2024)   Depression (PHQ2-9)    PHQ-2 Score: 0  Alcohol Screen: Low Risk (02/10/2024)   Alcohol Screen    Last Alcohol Screening Score (AUDIT): 0  Housing: Low Risk (02/10/2024)   Epic    Unable to Pay for Housing in the Last Year: No    Number of Times Moved in the Last Year: 0  Homeless in the Last Year: No  Utilities: Not At Risk (02/10/2024)   Epic    Threatened with loss of utilities: No  Health Literacy: Adequate Health Literacy (02/10/2024)   B1300 Health Literacy    Frequency of need for help with medical instructions: Never    Family History  Problem Relation Age of Onset   Heart attack Mother    Obesity Father    Cancer Brother    ADD / ADHD Son    Alcohol abuse Brother    Cancer Brother    Hypertension Brother    Obesity Brother    Hypertension Brother    Colon cancer Neg Hx    Esophageal cancer Neg Hx    Rectal cancer Neg Hx    Stomach cancer Neg Hx     Medications Ordered Prior to Encounter[1]  Allergies[2]     Physical Exam Vitals requested from patient and listed below if patient had equipment and was able to obtain at home for this virtual  visit: There were no vitals filed for this visit. Estimated body mass index is 34.05 kg/m as calculated from the following:   Height as of 01/31/24: 5' 7 (1.702 m).   Weight as of 01/31/24: 217 lb 6.4 oz (98.6 kg).  EKG (optional): deferred due to virtual visit  GENERAL: alert, oriented, no acute distress detected; full vision exam deferred due to pandemic and/or virtual encounter  ***  HEENT: atraumatic, conjunttiva clear, no obvious abnormalities on inspection of external nose and ears  NECK: normal movements of the head and neck  LUNGS: on inspection no signs of respiratory distress, breathing rate appears normal, no obvious gross SOB, gasping or wheezing  CV: no obvious cyanosis  MS: moves all visible extremities without noticeable abnormality  PSYCH/NEURO: pleasant and cooperative, no obvious depression or anxiety, speech and thought processing grossly intact, Cognitive function grossly intact  Flowsheet Row Office Visit from 04/08/2023 in [facility] [place] [facility]  PHQ-9 Total Score 5        02/10/2024    9:42 AM 04/08/2023    9:10 AM 02/09/2023    3:50 PM 10/20/2022    3:47 PM 08/19/2022    1:05 PM  Depression screen PHQ 2/9  Decreased Interest 0 0 0 0 1  Down, Depressed, Hopeless 0 0 0 0 1  PHQ - 2 Score 0 0 0 0 2  Altered sleeping  1  1 2   Tired, decreased energy  3  0 2  Change in appetite  0  0 3  Feeling bad or failure about yourself   0  0 0  Trouble concentrating  0  0 0  Moving slowly or fidgety/restless  1  0 0  Suicidal thoughts  0  0 0  PHQ-9 Score  5   1  9    Difficult doing work/chores    Not difficult at all Somewhat difficult     Data saved with a previous flowsheet row definition       10/20/2022    3:46 PM 02/09/2023    3:50 PM 04/08/2023    9:09 AM 02/08/2024   11:45 AM 02/10/2024    9:02 AM  Fall Risk  Falls in the past year? 0 0 1 1  1   Was there an injury with Fall? 0  0  0  1  1  Fall Risk Category Calculator 0 0 1 2  2    Patient at Risk for Falls Due to No Fall  Risks    No Fall Risks  Fall risk Follow up Falls evaluation completed Falls evaluation completed;Education provided;Falls prevention discussed Falls evaluation completed  Falls evaluation completed     Manually entered by patient   Data saved with a previous flowsheet row definition     SUMMARY AND PLAN:  No diagnosis found.  Visit coding *** [phone] (annual wellness visit -initial); G0439 (annual wellness subsequent); G0402 Welcome to Medicare(initial preventive physical exam)   Discussed applicable health maintenance/preventive health measures and advised and referred or ordered per patient preferences:  Health Maintenance  Topic Date Due   Medicare Annual Wellness (AWV)  02/09/2024   COVID-19 Vaccine (8 - Pfizer risk 2025-26 season) 04/19/2024   DTaP/Tdap/Td (3 - Td or Tdap) 08/25/2028   Pneumococcal Vaccine: 50+ Years  Completed   Influenza Vaccine  Completed   Hepatitis C Screening  Completed   Zoster Vaccines- Shingrix  Completed   Meningococcal B Vaccine  Aged Out   Colonoscopy  Discontinued     Education and counseling on the following was provided based on the above review of health and a plan/checklist for the patient, along with additional information discussed, was provided for the patient in the patient instructions :  -Advised on importance of completing advanced directives, discussed options for completing and provided information in patient instructions as well -Provided counseling and plan for difficulty hearing  -Provided counseling and plan for increased risk of falling if applicable per above screening. Reviewed and demonstrated safe balance exercises that can be done at home to improve balance and discussed exercise guidelines for adults with include balance exercises at least 3 days per week.  -Advised and counseled on a healthy lifestyle - including the importance of a healthy diet, regular physical activity, social  connections and stress management. -Reviewed patient's current diet. Advised and counseled on a whole foods based healthy diet. A summary of a healthy diet was provided in the Patient Instructions.  -reviewed patient's current physical activity level and discussed exercise guidelines for adults. Discussed community resources and ideas for safe exercise at home to assist in meeting exercise guideline recommendations in a safe and healthy way.  -Advise yearly dental visits at minimum and regular eye exams -Advised and counseled on alcohol safe limits, risks/ tobacco use, risks of smoking and offered counseling/help, drug, opoid use/misuse   Follow up: see patient instructions   There are no Patient Instructions on file for this visit.  Chiquita JONELLE Cramp, DO     [1]  Current Outpatient Medications on File Prior to Visit  Medication Sig Dispense Refill   Cholecalciferol (VITAMIN D ) 50 MCG (2000 UT) tablet Take 2,000 Units by mouth daily.     cyanocobalamin  (VITAMIN B12) 1000 MCG/ML injection INJECT 0.1 MLS INTO THE MUSCLE EVERY 30 DAYS. 3 mL 7   fluticasone  (FLOVENT  HFA) 110 MCG/ACT inhaler Inhale 2 puffs into the lungs 2 (two) times daily. 12 g 11   furosemide  (LASIX ) 20 MG tablet Take 1 tablet (20 mg total) by mouth daily as needed. For 3 days for swelling 12 tablet 0   ipratropium (ATROVENT ) 0.06 % nasal spray Place 2 sprays into both nostrils 4 (four) times daily as needed (nasal drainage). (Patient taking differently: Place 2 sprays into both nostrils every evening.) 45 mL 11   levocetirizine (XYZAL ) 5 MG tablet Take 1 tablet (5 mg total) by mouth daily. 30 tablet 11   metoprolol  succinate (TOPROL -XL) 50 MG 24 hr tablet Take 1 tablet (50 mg total) by  mouth in the morning and at bedtime. Take with or immediately following a meal. 180 tablet 3   potassium chloride  SA (KLOR-CON  M) 20 MEQ tablet TAKE 1 TABLET BY MOUTH EVERY DAY 90 tablet 1   pravastatin  (PRAVACHOL ) 20 MG tablet TAKE 1 TABLET BY  MOUTH EVERY DAY 90 tablet 1   triamcinolone  cream (KENALOG ) 0.1 % APPLY TOPICALLY 3 TIMES A DAY 454 g 0   valsartan -hydrochlorothiazide  (DIOVAN -HCT) 160-25 MG tablet TAKE 1 TABLET BY MOUTH EVERY DAY 90 tablet 0   XARELTO  20 MG TABS tablet TAKE 1 TABLET BY MOUTH DAILY WITH SUPPER. 90 tablet 1   No current facility-administered medications on file prior to visit.  [2]  Allergies Allergen Reactions   Simvastatin  Other (See Comments)    Leg cramps

## 2024-03-13 ENCOUNTER — Ambulatory Visit: Admitting: Physician Assistant

## 2024-06-06 ENCOUNTER — Telehealth: Admitting: Family Medicine

## 2024-10-05 ENCOUNTER — Ambulatory Visit: Admitting: Allergy
